# Patient Record
Sex: Female | Born: 1945 | Race: White | Hispanic: No | Smoking: Former smoker
Health system: Southern US, Community
[De-identification: ages and names within clinical notes are randomized; demographics above are authoritative.]

## PROBLEM LIST (undated history)

## (undated) DIAGNOSIS — M797 Fibromyalgia: Secondary | ICD-10-CM

## (undated) DIAGNOSIS — R002 Palpitations: Secondary | ICD-10-CM

## (undated) DIAGNOSIS — K219 Gastro-esophageal reflux disease without esophagitis: Secondary | ICD-10-CM

## (undated) DIAGNOSIS — M1990 Unspecified osteoarthritis, unspecified site: Secondary | ICD-10-CM

## (undated) DIAGNOSIS — K589 Irritable bowel syndrome without diarrhea: Secondary | ICD-10-CM

## (undated) DIAGNOSIS — I340 Nonrheumatic mitral (valve) insufficiency: Secondary | ICD-10-CM

## (undated) DIAGNOSIS — E538 Deficiency of other specified B group vitamins: Secondary | ICD-10-CM

## (undated) DIAGNOSIS — I10 Essential (primary) hypertension: Secondary | ICD-10-CM

## (undated) DIAGNOSIS — K635 Polyp of colon: Secondary | ICD-10-CM

## (undated) DIAGNOSIS — E785 Hyperlipidemia, unspecified: Secondary | ICD-10-CM

## (undated) DIAGNOSIS — I499 Cardiac arrhythmia, unspecified: Secondary | ICD-10-CM

## (undated) DIAGNOSIS — K5790 Diverticulosis of intestine, part unspecified, without perforation or abscess without bleeding: Secondary | ICD-10-CM

## (undated) DIAGNOSIS — K2210 Ulcer of esophagus without bleeding: Secondary | ICD-10-CM

## (undated) DIAGNOSIS — T7840XA Allergy, unspecified, initial encounter: Secondary | ICD-10-CM

## (undated) DIAGNOSIS — K760 Fatty (change of) liver, not elsewhere classified: Secondary | ICD-10-CM

## (undated) DIAGNOSIS — K449 Diaphragmatic hernia without obstruction or gangrene: Secondary | ICD-10-CM

## (undated) DIAGNOSIS — K222 Esophageal obstruction: Secondary | ICD-10-CM

## (undated) DIAGNOSIS — F419 Anxiety disorder, unspecified: Secondary | ICD-10-CM

## (undated) HISTORY — DX: Unspecified osteoarthritis, unspecified site: M19.90

## (undated) HISTORY — DX: Irritable bowel syndrome, unspecified: K58.9

## (undated) HISTORY — DX: Anxiety disorder, unspecified: F41.9

## (undated) HISTORY — DX: Polyp of colon: K63.5

## (undated) HISTORY — PX: RECTOCELE REPAIR: SHX761

## (undated) HISTORY — DX: Essential (primary) hypertension: I10

## (undated) HISTORY — PX: ABDOMINAL HYSTERECTOMY: SHX81

## (undated) HISTORY — DX: Gastro-esophageal reflux disease without esophagitis: K21.9

## (undated) HISTORY — PX: APPENDECTOMY: SHX54

## (undated) HISTORY — DX: Fatty (change of) liver, not elsewhere classified: K76.0

## (undated) HISTORY — DX: Hyperlipidemia, unspecified: E78.5

## (undated) HISTORY — PX: TUBAL LIGATION: SHX77

## (undated) HISTORY — DX: Ulcer of esophagus without bleeding: K22.10

## (undated) HISTORY — DX: Cardiac arrhythmia, unspecified: I49.9

## (undated) HISTORY — DX: Fibromyalgia: M79.7

## (undated) HISTORY — DX: Palpitations: R00.2

## (undated) HISTORY — DX: Nonrheumatic mitral (valve) insufficiency: I34.0

## (undated) HISTORY — DX: Diverticulosis of intestine, part unspecified, without perforation or abscess without bleeding: K57.90

## (undated) HISTORY — DX: Deficiency of other specified B group vitamins: E53.8

## (undated) HISTORY — DX: Diaphragmatic hernia without obstruction or gangrene: K44.9

## (undated) HISTORY — DX: Esophageal obstruction: K22.2

## (undated) HISTORY — PX: CHOLECYSTECTOMY: SHX55

## (undated) HISTORY — DX: Allergy, unspecified, initial encounter: T78.40XA

---

## 1999-06-17 ENCOUNTER — Encounter (INDEPENDENT_AMBULATORY_CARE_PROVIDER_SITE_OTHER): Payer: Self-pay | Admitting: Specialist

## 1999-06-17 ENCOUNTER — Other Ambulatory Visit: Admission: RE | Admit: 1999-06-17 | Discharge: 1999-06-17 | Payer: Self-pay | Admitting: Gastroenterology

## 1999-06-17 ENCOUNTER — Encounter (INDEPENDENT_AMBULATORY_CARE_PROVIDER_SITE_OTHER): Payer: Self-pay | Admitting: *Deleted

## 1999-07-29 ENCOUNTER — Encounter: Payer: Self-pay | Admitting: Internal Medicine

## 1999-07-29 ENCOUNTER — Ambulatory Visit (HOSPITAL_COMMUNITY): Admission: RE | Admit: 1999-07-29 | Discharge: 1999-07-29 | Payer: Self-pay | Admitting: Internal Medicine

## 1999-08-15 ENCOUNTER — Ambulatory Visit (HOSPITAL_COMMUNITY): Admission: RE | Admit: 1999-08-15 | Discharge: 1999-08-15 | Payer: Self-pay | Admitting: Internal Medicine

## 1999-08-15 ENCOUNTER — Encounter: Payer: Self-pay | Admitting: Internal Medicine

## 2000-03-26 ENCOUNTER — Encounter: Admission: RE | Admit: 2000-03-26 | Discharge: 2000-03-26 | Payer: Self-pay | Admitting: Obstetrics and Gynecology

## 2000-03-26 ENCOUNTER — Encounter: Payer: Self-pay | Admitting: Obstetrics and Gynecology

## 2000-12-17 ENCOUNTER — Other Ambulatory Visit: Admission: RE | Admit: 2000-12-17 | Discharge: 2000-12-17 | Payer: Self-pay | Admitting: Gastroenterology

## 2000-12-17 ENCOUNTER — Encounter (INDEPENDENT_AMBULATORY_CARE_PROVIDER_SITE_OTHER): Payer: Self-pay | Admitting: *Deleted

## 2000-12-17 ENCOUNTER — Encounter (INDEPENDENT_AMBULATORY_CARE_PROVIDER_SITE_OTHER): Payer: Self-pay | Admitting: Specialist

## 2001-01-17 ENCOUNTER — Encounter (INDEPENDENT_AMBULATORY_CARE_PROVIDER_SITE_OTHER): Payer: Self-pay | Admitting: *Deleted

## 2001-01-17 ENCOUNTER — Encounter: Payer: Self-pay | Admitting: Gastroenterology

## 2001-01-17 ENCOUNTER — Encounter: Admission: RE | Admit: 2001-01-17 | Discharge: 2001-01-17 | Payer: Self-pay | Admitting: Gastroenterology

## 2001-01-19 ENCOUNTER — Ambulatory Visit (HOSPITAL_COMMUNITY): Admission: RE | Admit: 2001-01-19 | Discharge: 2001-01-19 | Payer: Self-pay | Admitting: Gastroenterology

## 2001-01-19 ENCOUNTER — Encounter: Payer: Self-pay | Admitting: Gastroenterology

## 2001-01-19 ENCOUNTER — Encounter (INDEPENDENT_AMBULATORY_CARE_PROVIDER_SITE_OTHER): Payer: Self-pay | Admitting: *Deleted

## 2001-03-30 ENCOUNTER — Encounter: Payer: Self-pay | Admitting: Obstetrics and Gynecology

## 2001-03-30 ENCOUNTER — Encounter: Admission: RE | Admit: 2001-03-30 | Discharge: 2001-03-30 | Payer: Self-pay | Admitting: Obstetrics and Gynecology

## 2001-04-05 ENCOUNTER — Encounter: Admission: RE | Admit: 2001-04-05 | Discharge: 2001-04-05 | Payer: Self-pay | Admitting: Obstetrics and Gynecology

## 2001-04-05 ENCOUNTER — Encounter: Payer: Self-pay | Admitting: Obstetrics and Gynecology

## 2001-04-06 ENCOUNTER — Encounter: Payer: Self-pay | Admitting: Obstetrics and Gynecology

## 2001-07-28 ENCOUNTER — Encounter: Payer: Self-pay | Admitting: Obstetrics and Gynecology

## 2001-07-28 ENCOUNTER — Encounter: Admission: RE | Admit: 2001-07-28 | Discharge: 2001-07-28 | Payer: Self-pay | Admitting: Obstetrics and Gynecology

## 2002-03-31 ENCOUNTER — Encounter: Payer: Self-pay | Admitting: Gynecology

## 2002-03-31 ENCOUNTER — Encounter: Admission: RE | Admit: 2002-03-31 | Discharge: 2002-03-31 | Payer: Self-pay | Admitting: Gynecology

## 2002-07-11 ENCOUNTER — Other Ambulatory Visit: Admission: RE | Admit: 2002-07-11 | Discharge: 2002-07-11 | Payer: Self-pay | Admitting: Gynecology

## 2003-02-15 ENCOUNTER — Encounter: Admission: RE | Admit: 2003-02-15 | Discharge: 2003-02-15 | Payer: Self-pay | Admitting: Internal Medicine

## 2003-02-15 ENCOUNTER — Encounter: Payer: Self-pay | Admitting: Internal Medicine

## 2003-04-24 ENCOUNTER — Encounter: Admission: RE | Admit: 2003-04-24 | Discharge: 2003-04-24 | Payer: Self-pay | Admitting: Gynecology

## 2003-04-24 ENCOUNTER — Encounter: Payer: Self-pay | Admitting: Gynecology

## 2003-06-15 ENCOUNTER — Ambulatory Visit (HOSPITAL_COMMUNITY): Admission: RE | Admit: 2003-06-15 | Discharge: 2003-06-15 | Payer: Self-pay | Admitting: Gastroenterology

## 2003-06-15 ENCOUNTER — Encounter (INDEPENDENT_AMBULATORY_CARE_PROVIDER_SITE_OTHER): Payer: Self-pay | Admitting: *Deleted

## 2003-07-26 ENCOUNTER — Other Ambulatory Visit: Admission: RE | Admit: 2003-07-26 | Discharge: 2003-07-26 | Payer: Self-pay | Admitting: Gynecology

## 2004-04-24 ENCOUNTER — Encounter: Payer: Self-pay | Admitting: Internal Medicine

## 2004-04-24 ENCOUNTER — Encounter (INDEPENDENT_AMBULATORY_CARE_PROVIDER_SITE_OTHER): Payer: Self-pay | Admitting: Gastroenterology

## 2004-04-24 ENCOUNTER — Encounter: Payer: Self-pay | Admitting: Gastroenterology

## 2004-05-02 ENCOUNTER — Encounter: Admission: RE | Admit: 2004-05-02 | Discharge: 2004-05-02 | Payer: Self-pay | Admitting: Gynecology

## 2004-07-30 ENCOUNTER — Other Ambulatory Visit: Admission: RE | Admit: 2004-07-30 | Discharge: 2004-07-30 | Payer: Self-pay | Admitting: Gynecology

## 2004-09-03 ENCOUNTER — Ambulatory Visit: Payer: Self-pay | Admitting: Internal Medicine

## 2005-03-30 ENCOUNTER — Ambulatory Visit: Payer: Self-pay | Admitting: Gastroenterology

## 2005-05-19 ENCOUNTER — Encounter: Admission: RE | Admit: 2005-05-19 | Discharge: 2005-05-19 | Payer: Self-pay | Admitting: Gynecology

## 2005-08-04 ENCOUNTER — Other Ambulatory Visit: Admission: RE | Admit: 2005-08-04 | Discharge: 2005-08-04 | Payer: Self-pay | Admitting: Gynecology

## 2006-02-02 ENCOUNTER — Ambulatory Visit: Payer: Self-pay | Admitting: Endocrinology

## 2006-03-31 ENCOUNTER — Ambulatory Visit: Payer: Self-pay | Admitting: Gastroenterology

## 2006-05-12 ENCOUNTER — Ambulatory Visit: Payer: Self-pay | Admitting: Internal Medicine

## 2006-05-25 ENCOUNTER — Encounter: Admission: RE | Admit: 2006-05-25 | Discharge: 2006-05-25 | Payer: Self-pay | Admitting: Gynecology

## 2006-08-05 ENCOUNTER — Other Ambulatory Visit: Admission: RE | Admit: 2006-08-05 | Discharge: 2006-08-05 | Payer: Self-pay | Admitting: Gynecology

## 2006-08-05 ENCOUNTER — Ambulatory Visit: Payer: Self-pay | Admitting: Internal Medicine

## 2006-08-05 LAB — CONVERTED CEMR LAB
Bilirubin Urine: NEGATIVE
Chol/HDL Ratio, serum: 3.2
Cholesterol: 274 mg/dL (ref 0–200)
Crystals: NEGATIVE
HDL: 85.7 mg/dL (ref >39.0)
Mucus, UA: NEGATIVE
RBC / HPF: NONE SEEN
Specific Gravity, Urine: 1.01 (ref 1.000–1.03)
Total Protein, Urine: NEGATIVE mg/dL
pH: 8 (ref 5.0–8.0)

## 2006-08-13 ENCOUNTER — Ambulatory Visit: Payer: Self-pay | Admitting: Internal Medicine

## 2006-09-13 ENCOUNTER — Encounter (INDEPENDENT_AMBULATORY_CARE_PROVIDER_SITE_OTHER): Payer: Self-pay | Admitting: *Deleted

## 2006-09-13 ENCOUNTER — Ambulatory Visit: Payer: Self-pay | Admitting: Gastroenterology

## 2007-04-29 ENCOUNTER — Ambulatory Visit: Payer: Self-pay | Admitting: Internal Medicine

## 2007-05-26 ENCOUNTER — Encounter: Payer: Self-pay | Admitting: *Deleted

## 2007-05-26 DIAGNOSIS — M797 Fibromyalgia: Secondary | ICD-10-CM | POA: Insufficient documentation

## 2007-05-26 DIAGNOSIS — E785 Hyperlipidemia, unspecified: Secondary | ICD-10-CM

## 2007-05-26 DIAGNOSIS — K589 Irritable bowel syndrome without diarrhea: Secondary | ICD-10-CM

## 2007-05-26 DIAGNOSIS — E538 Deficiency of other specified B group vitamins: Secondary | ICD-10-CM

## 2007-05-26 DIAGNOSIS — I059 Rheumatic mitral valve disease, unspecified: Secondary | ICD-10-CM | POA: Insufficient documentation

## 2007-05-26 DIAGNOSIS — N951 Menopausal and female climacteric states: Secondary | ICD-10-CM

## 2007-05-30 ENCOUNTER — Encounter: Admission: RE | Admit: 2007-05-30 | Discharge: 2007-05-30 | Payer: Self-pay | Admitting: Gynecology

## 2007-06-02 ENCOUNTER — Encounter: Admission: RE | Admit: 2007-06-02 | Discharge: 2007-06-02 | Payer: Self-pay | Admitting: Gynecology

## 2007-07-12 ENCOUNTER — Ambulatory Visit: Payer: Self-pay | Admitting: Internal Medicine

## 2007-07-12 DIAGNOSIS — M545 Low back pain: Secondary | ICD-10-CM

## 2007-07-12 DIAGNOSIS — Z860100 Personal history of colon polyps, unspecified: Secondary | ICD-10-CM | POA: Insufficient documentation

## 2007-07-12 DIAGNOSIS — R61 Generalized hyperhidrosis: Secondary | ICD-10-CM | POA: Insufficient documentation

## 2007-07-12 DIAGNOSIS — J069 Acute upper respiratory infection, unspecified: Secondary | ICD-10-CM

## 2007-07-12 DIAGNOSIS — R000 Tachycardia, unspecified: Secondary | ICD-10-CM

## 2007-08-08 ENCOUNTER — Other Ambulatory Visit: Admission: RE | Admit: 2007-08-08 | Discharge: 2007-08-08 | Payer: Self-pay | Admitting: Gynecology

## 2007-08-08 ENCOUNTER — Encounter: Payer: Self-pay | Admitting: Internal Medicine

## 2007-11-04 DIAGNOSIS — K219 Gastro-esophageal reflux disease without esophagitis: Secondary | ICD-10-CM | POA: Insufficient documentation

## 2007-11-04 DIAGNOSIS — D126 Benign neoplasm of colon, unspecified: Secondary | ICD-10-CM

## 2007-11-04 DIAGNOSIS — K208 Other esophagitis: Secondary | ICD-10-CM

## 2007-11-04 DIAGNOSIS — K319 Disease of stomach and duodenum, unspecified: Secondary | ICD-10-CM

## 2007-11-04 DIAGNOSIS — E669 Obesity, unspecified: Secondary | ICD-10-CM

## 2007-11-14 ENCOUNTER — Ambulatory Visit: Payer: Self-pay | Admitting: Internal Medicine

## 2007-11-14 DIAGNOSIS — R030 Elevated blood-pressure reading, without diagnosis of hypertension: Secondary | ICD-10-CM

## 2007-11-14 DIAGNOSIS — I10 Essential (primary) hypertension: Secondary | ICD-10-CM | POA: Insufficient documentation

## 2008-01-24 ENCOUNTER — Ambulatory Visit: Payer: Self-pay | Admitting: Internal Medicine

## 2008-01-24 DIAGNOSIS — M79609 Pain in unspecified limb: Secondary | ICD-10-CM

## 2008-01-24 DIAGNOSIS — J029 Acute pharyngitis, unspecified: Secondary | ICD-10-CM | POA: Insufficient documentation

## 2008-01-24 DIAGNOSIS — S139XXA Sprain of joints and ligaments of unspecified parts of neck, initial encounter: Secondary | ICD-10-CM | POA: Insufficient documentation

## 2008-04-24 ENCOUNTER — Encounter: Payer: Self-pay | Admitting: Internal Medicine

## 2008-04-26 ENCOUNTER — Encounter: Payer: Self-pay | Admitting: Internal Medicine

## 2008-06-01 ENCOUNTER — Ambulatory Visit: Payer: Self-pay | Admitting: Internal Medicine

## 2008-06-01 DIAGNOSIS — F4321 Adjustment disorder with depressed mood: Secondary | ICD-10-CM | POA: Insufficient documentation

## 2008-06-04 ENCOUNTER — Encounter: Admission: RE | Admit: 2008-06-04 | Discharge: 2008-06-04 | Payer: Self-pay | Admitting: Gynecology

## 2008-09-04 ENCOUNTER — Ambulatory Visit: Payer: Self-pay | Admitting: Internal Medicine

## 2008-09-04 DIAGNOSIS — R109 Unspecified abdominal pain: Secondary | ICD-10-CM | POA: Insufficient documentation

## 2008-09-04 DIAGNOSIS — R5381 Other malaise: Secondary | ICD-10-CM | POA: Insufficient documentation

## 2008-09-04 DIAGNOSIS — R5383 Other fatigue: Secondary | ICD-10-CM

## 2008-10-02 ENCOUNTER — Telehealth (INDEPENDENT_AMBULATORY_CARE_PROVIDER_SITE_OTHER): Payer: Self-pay | Admitting: *Deleted

## 2008-10-04 ENCOUNTER — Ambulatory Visit: Payer: Self-pay | Admitting: Internal Medicine

## 2008-11-01 ENCOUNTER — Ambulatory Visit: Payer: Self-pay | Admitting: Internal Medicine

## 2008-11-02 ENCOUNTER — Telehealth: Payer: Self-pay | Admitting: Internal Medicine

## 2008-11-02 ENCOUNTER — Ambulatory Visit (HOSPITAL_COMMUNITY): Admission: RE | Admit: 2008-11-02 | Discharge: 2008-11-02 | Payer: Self-pay | Admitting: Internal Medicine

## 2009-04-26 ENCOUNTER — Ambulatory Visit: Payer: Self-pay | Admitting: Internal Medicine

## 2009-04-26 DIAGNOSIS — R042 Hemoptysis: Secondary | ICD-10-CM | POA: Insufficient documentation

## 2009-04-26 DIAGNOSIS — R05 Cough: Secondary | ICD-10-CM

## 2009-04-29 DIAGNOSIS — F411 Generalized anxiety disorder: Secondary | ICD-10-CM | POA: Insufficient documentation

## 2009-06-11 ENCOUNTER — Encounter: Admission: RE | Admit: 2009-06-11 | Discharge: 2009-06-11 | Payer: Self-pay | Admitting: Gynecology

## 2009-06-19 ENCOUNTER — Encounter: Payer: Self-pay | Admitting: Internal Medicine

## 2009-10-18 ENCOUNTER — Encounter: Payer: Self-pay | Admitting: Internal Medicine

## 2009-11-21 ENCOUNTER — Telehealth: Payer: Self-pay | Admitting: Internal Medicine

## 2009-11-21 ENCOUNTER — Ambulatory Visit: Payer: Self-pay | Admitting: Internal Medicine

## 2009-11-21 DIAGNOSIS — J309 Allergic rhinitis, unspecified: Secondary | ICD-10-CM

## 2009-12-09 ENCOUNTER — Ambulatory Visit (HOSPITAL_COMMUNITY): Admission: RE | Admit: 2009-12-09 | Discharge: 2009-12-09 | Payer: Self-pay | Admitting: Internal Medicine

## 2009-12-09 ENCOUNTER — Encounter: Payer: Self-pay | Admitting: Internal Medicine

## 2009-12-09 ENCOUNTER — Ambulatory Visit: Payer: Self-pay

## 2009-12-09 ENCOUNTER — Ambulatory Visit: Payer: Self-pay | Admitting: Cardiology

## 2009-12-12 ENCOUNTER — Telehealth: Payer: Self-pay | Admitting: Internal Medicine

## 2009-12-16 ENCOUNTER — Ambulatory Visit: Payer: Self-pay | Admitting: Internal Medicine

## 2009-12-16 DIAGNOSIS — R002 Palpitations: Secondary | ICD-10-CM | POA: Insufficient documentation

## 2009-12-16 DIAGNOSIS — I519 Heart disease, unspecified: Secondary | ICD-10-CM

## 2010-06-09 ENCOUNTER — Encounter: Payer: Self-pay | Admitting: Internal Medicine

## 2010-06-12 ENCOUNTER — Ambulatory Visit: Payer: Self-pay | Admitting: Internal Medicine

## 2010-06-16 ENCOUNTER — Encounter: Admission: RE | Admit: 2010-06-16 | Discharge: 2010-06-16 | Payer: Self-pay | Admitting: Gynecology

## 2010-08-04 ENCOUNTER — Telehealth (INDEPENDENT_AMBULATORY_CARE_PROVIDER_SITE_OTHER): Payer: Self-pay | Admitting: *Deleted

## 2010-08-07 ENCOUNTER — Ambulatory Visit: Payer: Self-pay | Admitting: Internal Medicine

## 2010-08-07 ENCOUNTER — Encounter: Payer: Self-pay | Admitting: Internal Medicine

## 2010-08-08 LAB — CONVERTED CEMR LAB
Albumin: 4.5 g/dL (ref 3.5–5.2)
Alkaline Phosphatase: 45 units/L (ref 39–117)
Basophils Relative: 0.3 % (ref 0.0–3.0)
Bilirubin Urine: NEGATIVE
Bilirubin, Direct: 0.2 mg/dL (ref 0.0–0.3)
Blood, UA: NEGATIVE
Creatinine, Ser: 0.8 mg/dL (ref 0.4–1.2)
Eosinophils Absolute: 0 10*3/uL (ref 0.0–0.7)
Eosinophils Relative: 0.6 % (ref 0.0–5.0)
GFR calc non Af Amer: 71.98 mL/min (ref >60.00)
Glucose, Bld: 96 mg/dL (ref 70–99)
HCT: 44.9 % (ref 36.0–46.0)
Lymphocytes Relative: 25.4 % (ref 12.0–46.0)
MCHC: 33.9 g/dL (ref 30.0–36.0)
Monocytes Relative: 6 % (ref 3.0–12.0)
Neutro Abs: 5.9 10*3/uL (ref 1.4–7.7)
Platelets: 237 10*3/uL (ref 150.0–400.0)
Potassium: 5.1 meq/L (ref 3.5–5.1)
RBC: 4.93 M/uL (ref 3.87–5.11)
Sodium: 141 meq/L (ref 135–145)
Total Protein: 7.9 g/dL (ref 6.0–8.3)
WBC: 8.7 10*3/uL (ref 4.5–10.5)
pH: 6.5 (ref 5.0–8.0)

## 2010-08-12 ENCOUNTER — Telehealth: Payer: Self-pay | Admitting: Internal Medicine

## 2010-08-12 ENCOUNTER — Encounter: Payer: Self-pay | Admitting: Internal Medicine

## 2010-08-12 ENCOUNTER — Encounter (INDEPENDENT_AMBULATORY_CARE_PROVIDER_SITE_OTHER): Payer: Self-pay | Admitting: *Deleted

## 2010-09-07 ENCOUNTER — Encounter: Payer: Self-pay | Admitting: Gynecology

## 2010-09-12 ENCOUNTER — Encounter: Payer: Self-pay | Admitting: Family Medicine

## 2010-09-12 ENCOUNTER — Ambulatory Visit
Admission: RE | Admit: 2010-09-12 | Discharge: 2010-09-12 | Payer: Self-pay | Source: Home / Self Care | Attending: Family Medicine | Admitting: Family Medicine

## 2010-09-12 LAB — CONVERTED CEMR LAB
HDL goal, serum: 40 mg/dL
LDL Goal: 160 mg/dL

## 2010-09-15 ENCOUNTER — Telehealth: Payer: Self-pay | Admitting: Internal Medicine

## 2010-09-16 NOTE — Assessment & Plan Note (Signed)
Summary: f/u new med / SD   Vital Signs:  Patient profile:   65 year old female Height:      64 inches Weight:      185 pounds BMI:     31.87 O2 Sat:      96 % on Room air Temp:     97.1 degrees F oral Pulse rate:   74 / minute Pulse rhythm:   regular BP sitting:   140 / 84  (left arm) Cuff size:   large  Vitals Entered By: Rock Nephew CMA (Dec 16, 2009 1:58 PM)  O2 Flow:  Room air CC: follow-up visit// discuss BP medication Is Patient Diabetic? No Pain Assessment Patient in pain? no        Primary Care Provider:  Sonda Primes MD  CC:  follow-up visit// discuss BP medication.  History of Present Illness: F/u abn ECHO, anxiety, depressed mood. C/o depression. C/o occasional palpitations.  Current Medications (verified): 1)  Vitamin B-12 1000 Mcg  Tabs (Cyanocobalamin) .Marland Kitchen.. 1 Qd 2)  Vitamin D3 2000 Unit Caps (Cholecalciferol) .... Take 1 Tablet By Mouth Once A Day 3)  Aspirin 81 Mg  Tbec (Aspirin) .... One By Mouth Every Day 4)  Diazepam 5 Mg Tabs (Diazepam) .... 1/2 or 1 By Mouth Two Times A Day As Needed Anxiety 5)  Fish Oil  Oil (Fish Oil) .... Once Daily 6)  Nexium 40 Mg Cpdr (Esomeprazole Magnesium) .Marland Kitchen.. 1 By Mouth Qam ( Medically Necessary) 7)  Amlodipine Besylate 5 Mg Tabs (Amlodipine Besylate) .... 1/2 Tab By Mouth Once Daily 8)  Omeprazole  Allergies (verified): 1)  ! Codeine 2)  ! Sulfa 3)  ! Augmentin 4)  ! Naprosyn 5)  Propranolol Hcl (Propranolol Hcl)  Past History:  Past Surgical History: Last updated: 05/26/2007 Cholecystectomy  Social History: Last updated: 12/16/2009 Former Smoker Single lives with a female companion Alcoholic son - very stressed Alcohol Use - yes 2 glasses red wine occ Daily Caffeine Use 2-3 cups coffee Illicit Drug Use - no Retired 2010  Past Medical History: Hyperlipidemia FMS MVP IBS Borderline low Vit B12 Colonic polyps, hx of gyn Dr Nicholas Lose Anxiety/stress with alcoholic son Allergic rhinitis Mild  diastolic dysfanction on ECHO  6433  Social History: Former Smoker Single lives with a female companion Alcoholic son - very stressed Alcohol Use - yes 2 glasses red wine occ Daily Caffeine Use 2-3 cups coffee Illicit Drug Use - no Retired 2010  Review of Systems       The patient complains of chest pain and dyspnea on exertion.  The patient denies fever and abdominal pain.         Stressed out  Physical Exam  General:  Well developed, well nourished, no acute distress. Eyes:  No corneal or conjunctival inflammation noted. EOMI. Perrla.  Nose:  External nasal examination shows no deformity or inflammation. Nasal mucosa are pink and moist without lesions or exudates. Mouth:  No deformity or lesions, dentition normal. Lungs:  Clear throughout to auscultation. Heart:  Regular rate and rhythm; no murmurs, rubs,  or bruits. Abdomen:  obese, soft without tenderness mass or hernia good bowel sounds heard there are surgical incision well healed Msk:  WNL Extremities:  No palmar erythema, no edema.  Neurologic:  Alert and  oriented x4;  grossly normal neurologically. Skin:  Intact without significant lesions or rashes. Psych:  Oriented X3 and normally interactive. Sad.     Impression & Recommendations:  Problem # 1:  PALPITATIONS (ICD-785.1) Assessment Unchanged Stress discussed  Problem # 2:  DIASTOLIC DYSFUNCTION (ICD-429.9) mild Assessment: New Discussed. We will try a low dose Norvasc if tolerated  Problem # 3:  ELEVATED BP (ICD-796.2) Assessment: Comment Only BP nl at home Her updated medication list for this problem includes:    Amlodipine Besylate 5 Mg Tabs (Amlodipine besylate) .Marland Kitchen... 1/2 tab by mouth once daily  Problem # 4:  ANXIETY (ICD-300.00) Assessment: Unchanged  Her updated medication list for this problem includes:    Diazepam 5 Mg Tabs (Diazepam) .Marland Kitchen... 1/2 or 1 by mouth two times a day as needed anxiety  Complete Medication List: 1)  Vitamin B-12 1000  Mcg Tabs (Cyanocobalamin) .Marland Kitchen.. 1 qd 2)  Vitamin D3 2000 Unit Caps (Cholecalciferol) .... Take 1 tablet by mouth once a day 3)  Aspirin 81 Mg Tbec (Aspirin) .... One by mouth every day 4)  Diazepam 5 Mg Tabs (Diazepam) .... 1/2 or 1 by mouth two times a day as needed anxiety 5)  Fish Oil Oil (Fish oil) .... Once daily 6)  Nexium 40 Mg Cpdr (Esomeprazole magnesium) .Marland Kitchen.. 1 by mouth qam ( medically necessary) 7)  Amlodipine Besylate 5 Mg Tabs (Amlodipine besylate) .... 1/2 tab by mouth once daily 8)  Omeprazole   Patient Instructions: 1)  Please schedule a follow-up appointment in 2 months. 2)  Call if you are not better in a reasonable amount of time or if worse. 3)  Take Amlodipine 1/4 tab a day to start with

## 2010-09-16 NOTE — Progress Notes (Signed)
Summary: RESULTS ECHO  Phone Note Call from Patient Call back at [phone]   Summary of Call: Patient is requesting results of Echo. She was told by cardiology that Dr Posey Rea would have results the same day test was completed. Please advise, Patient is very anxious.  Initial call taken by: Lamar Sprinkles, CMA,  December 12, 2009 1:58 PM  Follow-up for Phone Call        ECHO looks pretty good  but it does show a very minor issue with the relaxation phase of the heart cycle, this is usually caused  by hypertension and can be discussed with Dr. Demetrius Charity. during the next visit Follow-up by: Etta Grandchild MD,  December 12, 2009 2:01 PM  Additional Follow-up for Phone Call Additional follow up Details #1::        Pt informed. She says that at the office visit for ECHO she was under a tremendous amount of stress and had been for a week for more. She describes her visit for the echo as stressful and bp was elevated. When asking if the elevated bp would affect the test, pt was not given a direct answer. She requested to reschedule if it would affect the results, again was not given a straight answer, per pt.  Could elevated bp during echo cause the abnormality?  Additional Follow-up by: Lamar Sprinkles, CMA,  December 12, 2009 6:16 PM    Additional Follow-up for Phone Call Additional follow up Details #2::    No. Not to worry. She would benefit from a low dose of BP medication that helps heart to relax in dyastole. Try Amlodipine. ROV in 1 mo No mitral valve prolapse was noted on ECHO, good news. Follow-up by: Tresa Garter MD,  December 13, 2009 7:49 AM  Additional Follow-up for Phone Call Additional follow up Details #3:: Details for Additional Follow-up Action Taken: Pt informed, she does not want to wait 1 full mth but will take med. Scheduled for 2 wk f/u. Would like to discuss the "palpatations" further. Has concerns about arrhythmias and would like to discuss this at next office visit. .....Marland KitchenMarland KitchenLamar Sprinkles, CMA  December 13, 2009 2:59 PM   New/Updated Medications: AMLODIPINE BESYLATE 5 MG TABS (AMLODIPINE BESYLATE) 1/2 tab by mouth once daily Prescriptions: AMLODIPINE BESYLATE 5 MG TABS (AMLODIPINE BESYLATE) 1/2 tab by mouth once daily  #30 x 12   Entered and Authorized by:   Tresa Garter MD   Signed by:   Lamar Sprinkles, CMA on 12/13/2009   Method used:   Electronically to        [id number] (retail)       [address]       [place], [place]  [address]       Ph: [phone] or [phone]       Fax: [phone]   RxID:   [id number]

## 2010-09-16 NOTE — Miscellaneous (Signed)
Summary: Flu 2011   Clinical Lists Changes  Observations: Added new observation of FLU VAX: Historical (06/05/2010 8:24)      Immunization History:  Influenza Immunization History:    Influenza:  historical (06/05/2010)

## 2010-09-16 NOTE — Assessment & Plan Note (Signed)
Summary: chest congestion--stc   Vital Signs:  Patient profile:   65 year old female Height:      64 inches (162.56 cm) Weight:      189.8 pounds (86.27 kg) BMI:     32.70 O2 Sat:      99 % Temp:     98.4 degrees F (36.89 degrees C) oral Pulse rate:   100 / minute BP sitting:   162 / 90  (left arm) Cuff size:   regular  Vitals Entered By: Orlan Leavens (November 21, 2009 9:09 AM) CC: chest congestion Is Patient Diabetic? No Pain Assessment Patient in pain? no        Primary Care Provider:  Sonda Primes MD  CC:  chest congestion.  History of Present Illness: C/o chest congestion, allergies. C/o stress - worse lately. B in law just died of pneumonia C/o stress States BP is good all the time  Current Medications (verified): 1)  Omeprazole 20 Mg  Cpdr (Omeprazole) .... Take 1-2 By Mouth Qd 2)  Vitamin B-12 1000 Mcg  Tabs (Cyanocobalamin) .Marland Kitchen.. 1 Qd 3)  Vitamin D3 1000 Unit  Tabs (Cholecalciferol) .Marland Kitchen.. 1 Qd 4)  Aspirin 81 Mg  Tbec (Aspirin) .... One By Mouth Every Day 5)  Diazepam 5 Mg Tabs (Diazepam) .... 1/2 or 1 By Mouth Two Times A Day As Needed Anxiety 6)  Fish Oil  Oil (Fish Oil) .... Once Daily 7)  Fluticasone Propionate 50 Mcg/act  Susp (Fluticasone Propionate) .... 2 Sprays Each Nostril Once Daily  Allergies (verified): 1)  ! Codeine 2)  ! Sulfa 3)  ! Augmentin 4)  ! Naprosyn 5)  Propranolol Hcl (Propranolol Hcl)  Past History:  Past Surgical History: Last updated: 05/26/2007 Cholecystectomy  Family History: Last updated: 11/01/2008 Family History Hypertension Family History of Ovarian Cancer: Mother No FH of Colon Cancer: Family History of Diabetes: Sister  Social History: Last updated: 04/26/2009 Former Smoker Single lives with a female companion Alcohol Use - yes 2 glasses red wine occ Daily Caffeine Use 2-3 cups coffee Illicit Drug Use - no Retired 2010  Past Medical History: Hyperlipidemia FMS MVP IBS Borderline low Vit B12 Colonic  polyps, hx of gyn Dr Nicholas Lose Anxiety Allergic rhinitis  Review of Systems  The patient denies dyspnea on exertion, abdominal pain, suspicious skin lesions, and difficulty walking.    Physical Exam  General:  Well developed, well nourished, no acute distress. Nose:  External nasal examination shows no deformity or inflammation. Nasal mucosa are pink and moist without lesions or exudates. Mouth:  No deformity or lesions, dentition normal. Lungs:  Clear throughout to auscultation. Heart:  Regular rate and rhythm; no murmurs, rubs,  or bruits. Abdomen:  obese, soft without tenderness mass or hernia good bowel sounds heard there are surgical incision well healed Msk:  WNL Neurologic:  Alert and  oriented x4;  grossly normal neurologically. Skin:  Intact without significant lesions or rashes. Psych:  Oriented X3 and normally interactive. Sad.     Impression & Recommendations:  Problem # 1:  ALLERGIC RHINITIS (ICD-477.9) Assessment New  Her updated medication list for this problem includes:    Fluticasone Propionate 50 Mcg/act Susp (Fluticasone propionate) .Marland Kitchen... 2 sprays each nostril once daily    Loratadine 10 Mg Tabs (Loratadine) .Marland Kitchen... 1 by mouth once daily as needed allergies  Problem # 2:  GRIEF REACTION (ICD-309.0) Assessment: New Discussed  Problem # 3:  HYPERTENSION (ICD-401.9) Assessment: Deteriorated  States BP nl at home The following  medications were removed from the medication list:    Propranolol Hcl 10 Mg Tabs (Propranolol hcl) .Marland Kitchen... 1/2 by mouth in am and 1/2 in pm  BP today: 162/90 Prior BP: 150/96 (04/26/2009)  Labs Reviewed: Chol: 274 (08/05/2006)   HDL: 85.7 (08/05/2006)   LDL: DEL (08/05/2006)   TG: 87 (08/05/2006)  Problem # 4:  ANXIETY (ICD-300.00) Assessment: Unchanged  Her updated medication list for this problem includes:    Diazepam 5 Mg Tabs (Diazepam) .Marland Kitchen... 1/2 or 1 by mouth two times a day as needed anxiety  Complete Medication List: 1)   Vitamin B-12 1000 Mcg Tabs (Cyanocobalamin) .Marland Kitchen.. 1 qd 2)  Vitamin D3 1000 Unit Tabs (Cholecalciferol) .Marland Kitchen.. 1 qd 3)  Aspirin 81 Mg Tbec (Aspirin) .... One by mouth every day 4)  Diazepam 5 Mg Tabs (Diazepam) .... 1/2 or 1 by mouth two times a day as needed anxiety 5)  Fish Oil Oil (Fish oil) .... Once daily 6)  Fluticasone Propionate 50 Mcg/act Susp (Fluticasone propionate) .... 2 sprays each nostril once daily 7)  Loratadine 10 Mg Tabs (Loratadine) .Marland Kitchen.. 1 by mouth once daily as needed allergies 8)  Zithromax Z-pak 250 Mg Tabs (Azithromycin) .... As dirrected 9)  Nexium 40 Mg Cpdr (Esomeprazole magnesium) .Marland Kitchen.. 1 by mouth qam ( medically necessary)  Other Orders: Pneumococcal Vaccine (04540) Admin 1st Vaccine (98119) Admin 1st Vaccine [facility]) [phone]) Echo Referral (Echo)  Patient Instructions: 1)  Please schedule a follow-up appointment in 6 months well w/labs. 2)  Nl BP <135/85 Prescriptions: ZITHROMAX Z-PAK 250 MG TABS (AZITHROMYCIN) as dirrected  #1 x 0   Entered and Authorized by:   Tresa Garter MD   Signed by:   Tresa Garter MD on 11/21/2009   Method used:   Electronically to        [id number]* (retail)       [address]       [place], [place]  [address]       Ph: [phone] or [phone]       Fax: [phone]   RxID:   [id number] LORATADINE 10 MG TABS (LORATADINE) 1 by mouth once daily as needed allergies  #90 x 3   Entered and Authorized by:   Tresa Garter MD   Signed by:   Tresa Garter MD on 11/21/2009   Method used:   Electronically to        [facility]  [address] [id number]* (retail)       [address]       [place], [place]  [address]       Ph: [phone] or [phone]       Fax: [phone]   RxID:   [id number]    Pneumovax Vaccine    Vaccine Type: Pneumovax    Site: left deltoid    Mfr: Merck    Dose: 0.5 ml    Route: IM    Given by: Orlan Leavens     Exp. Date: 03/31/2011    Lot #: [id number]    VIS given: 11/21/09

## 2010-09-16 NOTE — Progress Notes (Signed)
Summary: nexium  Phone Note Call from Patient   Caller: Patient Reason for Call: Talk to Doctor Summary of Call: Pt is requesting a new rx for nexium.  Initial call taken by: Orlan Leavens,  November 21, 2009 9:58 AM  Follow-up for Phone Call        ok Follow-up by: Tresa Garter MD,  November 21, 2009 12:04 PM  Additional Follow-up for Phone Call Additional follow up Details #1::        called pt no ansew [facility] rx sent to pharm Additional Follow-up by: Orlan Leavens,  November 21, 2009 3:38 PM    New/Updated Medications: NEXIUM 40 MG CPDR (ESOMEPRAZOLE MAGNESIUM) 1 by mouth qam ( medically necessary) Prescriptions: NEXIUM 40 MG CPDR (ESOMEPRAZOLE MAGNESIUM) 1 by mouth qam ( medically necessary)  #30 x 12   Entered and Authorized by:   Tresa Garter MD   Signed by:   Orlan Leavens on 11/21/2009   Method used:   Electronically to        [facility]  [address] [id number] (retail)       [address]       [place], [place]  [address]       Ph: [phone] or [phone]       Fax: [phone]   RxID:   [id number]

## 2010-09-18 ENCOUNTER — Encounter (INDEPENDENT_AMBULATORY_CARE_PROVIDER_SITE_OTHER): Payer: Medicare Other

## 2010-09-18 DIAGNOSIS — R002 Palpitations: Secondary | ICD-10-CM

## 2010-09-18 NOTE — Assessment & Plan Note (Signed)
Vital Signs:  Patient profile:   65 year old female Height:      64 inches Weight:      183 pounds BMI:     31.53 Temp:     98.8 degrees F oral Pulse rate:   88 / minute Pulse rhythm:   regular Resp:     16 per minute BP sitting:   140 / 90  (left arm) Cuff size:   regular  Vitals Entered By: Lanier Prude, CMA(AAMA) (June 12, 2010 8:43 AM) CC: sore throat/ear pain X 3 days Is Patient Diabetic? No   Primary Care Provider:  Sonda Primes MD  CC:  sore throat/ear pain X 3 days.  History of Present Illness: The patient presents for a follow up of GERD, anxiety, depression and diast dysfunction.   Current Medications (verified): 1)  Vitamin B-12 1000 Mcg  Tabs (Cyanocobalamin) .Marland Kitchen.. 1 Qd 2)  Vitamin D3 2000 Unit Caps (Cholecalciferol) .... Take 1 Tablet By Mouth Once A Day 3)  Aspirin 81 Mg  Tbec (Aspirin) .... One By Mouth Every Day 4)  Diazepam 5 Mg Tabs (Diazepam) .... 1/2 or 1 By Mouth Two Times A Day As Needed Anxiety 5)  Fish Oil  Oil (Fish Oil) .... Once Daily 6)  Nexium 40 Mg Cpdr (Esomeprazole Magnesium) .Marland Kitchen.. 1 By Mouth Qam ( Medically Necessary) 7)  Amlodipine Besylate 5 Mg Tabs (Amlodipine Besylate) .... 1/2 Tab By Mouth Once Daily 8)  Omeprazole 9)  Omeprazole 40 Mg Cpdr (Omeprazole) .Marland Kitchen.. 1 By Mouth Bid 10)  Zithromax Z-Pak 250 Mg Tabs (Azithromycin) .... As Dirrected  Allergies (verified): 1)  ! Codeine 2)  ! Sulfa 3)  ! Augmentin 4)  ! 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 Allergic rhinitis Mild diastolic dysfanction on ECHO  1610  Social History: Last updated: 12/16/2009 Former Smoker Single lives with a female companion Alcoholic son - very stressed Alcohol Use - yes 2 glasses red wine occ Daily Caffeine Use 2-3 cups coffee Illicit Drug Use -  no Retired 2010  Review of Systems  The patient denies anorexia, chest pain, dyspnea on exertion, and abdominal pain.    Physical Exam  General:  Well developed, well nourished, no acute distress. Nose:  External nasal examination shows no deformity or inflammation. Nasal mucosa are pink and moist without lesions or exudates. Mouth:  No deformity or lesions, dentition normal. Lungs:  Clear throughout to auscultation. Heart:  Regular rate and rhythm; no murmurs, rubs,  or bruits. Abdomen:  obese, soft without tenderness mass or hernia good bowel sounds heard there are surgical incision well healed Msk:  WNL Extremities:  No palmar erythema, no edema.  Neurologic:  Alert and  oriented x4;  grossly normal neurologically. Skin:  Intact without significant lesions or rashes. Psych:  Oriented X3 and normally interactive. Sad.     Impression & Recommendations:  Problem # 1:  DIASTOLIC DYSFUNCTION (ICD-429.9) Assessment Unchanged Take Norvasc  Problem # 2:  ANXIETY (ICD-300.00) Assessment: Unchanged  Her updated medication list for this problem includes:    Diazepam 5 Mg Tabs (Diazepam) .Marland Kitchen... 1/2 or 1 by mouth two times a day as needed anxiety  Problem # 3:  HYPERTENSION (ICD-401.9) Assessment: Unchanged  Her updated medication list for this problem includes:    Amlodipine Besylate 5 Mg Tabs (Amlodipine  besylate) .Marland Kitchen... 1/2 tab by mouth once daily  Problem # 4:  GERD (ICD-530.81) Assessment: Comment Only  Her updated medication list for this problem includes:    Nexium 40 Mg Cpdr (Esomeprazole magnesium) .Marland Kitchen... 1 by mouth qam ( medically necessary)    Omeprazole 40 Mg Cpdr (Omeprazole) .Marland Kitchen... 1 by mouth bid  Problem # 5:  FATIGUE (ICD-780.79) Assessment: Unchanged  Complete Medication List: 1)  Vitamin B-12 1000 Mcg Tabs (Cyanocobalamin) .Marland Kitchen.. 1 qd 2)  Vitamin D3 2000 Unit Caps (Cholecalciferol) .... Take 1 tablet by mouth once a day 3)  Aspirin 81 Mg Tbec (Aspirin) .... One  by mouth every day 4)  Diazepam 5 Mg Tabs (Diazepam) .... 1/2 or 1 by mouth two times a day as needed anxiety 5)  Fish Oil Oil (Fish oil) .... Once daily 6)  Nexium 40 Mg Cpdr (Esomeprazole magnesium) .Marland Kitchen.. 1 by mouth qam ( medically necessary) 7)  Amlodipine Besylate 5 Mg Tabs (Amlodipine besylate) .... 1/2 tab by mouth once daily 8)  Omeprazole  9)  Omeprazole 40 Mg Cpdr (Omeprazole) .Marland Kitchen.. 1 by mouth bid 10)  Zithromax Z-pak 250 Mg Tabs (Azithromycin) .... As dirrected  Other Orders: Tdap => 9yrs IM ([address]) Admin 1st Vaccine ([address])  Patient Instructions: 1)  Please schedule a follow-up appointment in 3 months.   Orders Added: 1)  Tdap => 27yrs IM [90715] 2)  Admin 1st Vaccine [90471] 3)  Est. Patient Level IV [09811]   Immunizations Administered:  Tetanus Vaccine:    Vaccine Type: Tdap    Site: left deltoid    Mfr: GlaxoSmithKline    Dose: 0.5 ml    Route: IM    Given by: Lanier Prude, CMA(AAMA)    Exp. Date: 06/05/2012    Lot #: [id number]    VIS given: 07/04/08 version given June 12, 2010.   Immunizations Administered:  Tetanus Vaccine:    Vaccine Type: Tdap    Site: left deltoid    Mfr: GlaxoSmithKline    Dose: 0.5 ml    Route: IM    Given by: Lanier Prude, CMA(AAMA)    Exp. Date: 06/05/2012    Lot #: [id number]    VIS given: 07/04/08 version given June 12, 2010.  Appended Document:  Problem #6. URI. Zpac as dirrected

## 2010-09-18 NOTE — Assessment & Plan Note (Signed)
Summary: BP PROBLEM---STC   Vital Signs:  Patient profile:   65 year old female Height:      64 inches Weight:      185.50 pounds BMI:     31.96 O2 Sat:      97 % on Room air Temp:     98.1 degrees F oral Pulse rate:   96 / minute Pulse rhythm:   regular BP sitting:   168 / 100  (left arm) Cuff size:   large  Vitals Entered By: Rock Nephew CMA (August 07, 2010 11:10 AM)  O2 Flow:  Room air CC: Patient c/o high bp readings   Primary Care Provider:  Sonda Primes MD  CC:  Patient c/o high bp readings.  History of Present Illness: C/o elev BP and elev HR x weeks. C/o palpitations at times SBP 120-150s at home HR70-83 The patient presents with complaints of sore throat,, sinus congestion and drainge of several days duration. Not better with OTC meds.   Allergies (verified): 1)  ! Codeine 2)  ! Sulfa 3)  ! Augmentin 4)  ! 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 Allergic rhinitis Mild diastolic dysfanction on ECHO  1610  Social History: Last updated: 12/16/2009 Former Smoker Single lives with a female companion Alcoholic son - very stressed Alcohol Use - yes 2 glasses red wine occ Daily Caffeine Use 2-3 cups coffee Illicit Drug Use - no Retired 2010  Review of Systems  The patient denies fever, hoarseness, chest pain, and abdominal pain.         stressed  Physical Exam  General:  Well developed, well nourished, no acute distress. Eyes:  No corneal or conjunctival inflammation noted. EOMI. Perrla.  Mouth:  No deformity or lesions, dentition normal. Lungs:  Clear throughout to auscultation. Heart:  Regular rate and rhythm; no murmurs, rubs,  or bruits. Tachycardic Abdomen:  obese, soft without tenderness mass or hernia good bowel sounds heard there are  surgical incision well healed Msk:  WNL Extremities:  No palmar erythema, no edema.  Neurologic:  Alert and  oriented x4;  grossly normal neurologically. Skin:  Intact without significant lesions or rashes. Psych:  Oriented X3 and normally interactive. Not depressed appearing and slightly anxious.     Impression & Recommendations:  Problem # 1:  HYPERTENSION (ICD-401.9) Assessment Deteriorated  Her updated medication list for this problem includes:    Amlodipine Besylate 5 Mg Tabs (Amlodipine besylate) .Marland Kitchen... 1/2 tab by mouth bid  Problem # 2:  ANXIETY (ICD-300.00)/ stress Assessment: Deteriorated She declined SSRI Her updated medication list for this problem includes:    Diazepam 5 Mg Tabs (Diazepam) .Marland Kitchen... 1/2 or 1 by mouth two times a day as needed anxiety  Problem # 3:  TACHYCARDIA (ICD-785.0) Assessment: Unchanged  Orders: TLB-BMP (Basic Metabolic Panel-BMET) (80048-METABOL) TLB-B12, Serum-Total ONLY (96045-W09) TLB-CBC Platelet - w/Differential (85025-CBCD) TLB-Hepatic/Liver Function Pnl (80076-HEPATIC) TLB-TSH (Thyroid Stimulating Hormone) (84443-TSH) TLB-Udip ONLY (81003-UDIP)  Problem # 4:  VITAMIN B12 DEFICIENCY (ICD-266.2) Assessment: Comment Only  On the regimen of medicine(s) reflected in the chart    Orders: TLB-BMP (Basic Metabolic Panel-BMET) (80048-METABOL) TLB-B12, Serum-Total ONLY (81191-Y78) TLB-CBC Platelet - w/Differential (85025-CBCD) TLB-Hepatic/Liver Function Pnl (80076-HEPATIC) TLB-TSH (Thyroid Stimulating Hormone) (84443-TSH) TLB-Udip ONLY (81003-UDIP)  Problem # 5:  UPPER RESPIRATORY INFECTION, ACUTE (ICD-465.9) Assessment: New Zpac if worse  Her updated medication list for this problem includes:    Aspirin 81 Mg Tbec (Aspirin) ..... One by mouth every day    Loratadine 10 Mg Tabs (Loratadine) .Marland Kitchen... 1 by mouth once daily as needed allergies  Complete Medication List: 1)  Vitamin B-12 1000 Mcg Tabs (Cyanocobalamin) .Marland Kitchen.. 1 qd 2)  Vitamin  D3 2000 Unit Caps (Cholecalciferol) .... Take 1 tablet by mouth once a day 3)  Aspirin 81 Mg Tbec (Aspirin) .... One by mouth every day 4)  Diazepam 5 Mg Tabs (Diazepam) .... 1/2 or 1 by mouth two times a day as needed anxiety 5)  Fish Oil Oil (Fish oil) .... Once daily 6)  Amlodipine Besylate 5 Mg Tabs (Amlodipine besylate) .... 1/2 tab by mouth bid 7)  Omeprazole 40 Mg Cpdr (Omeprazole) .Marland Kitchen.. 1 by mouth bid 8)  Loratadine 10 Mg Tabs (Loratadine) .Marland Kitchen.. 1 by mouth once daily as needed allergies 9)  Zithromax Z-pak 250 Mg Tabs (Azithromycin) .... As dirrected  Other Orders: EKG w/ Interpretation (93000)  Patient Instructions: 1)  Please schedule a follow-up appointment in 3 months. 2)  Call if you are not better in a reasonable amount of time or if worse. Go to ER if feeling really bad!  3)  Normal BP 130/85 or less 4)  Heart rate 60-80 is normal 5)  Use over-the-counter medicines for "cold": Tylenol  650mg  or Advil 400mg  every 6 hours  for fever; Delsym or Robutussin for cough. Mucinex for congestion. Ricola or Halls for sore throat. Office visit if not better or if worse.  Prescriptions: ZITHROMAX Z-PAK 250 MG TABS (AZITHROMYCIN) as dirrected  #1 x 0   Entered and Authorized by:   Tresa Garter MD   Signed by:   Tresa Garter MD on 08/07/2010   Method used:   Print then Give to Patient   RxID:   [id number]    Orders Added: 1)  TLB-BMP (Basic Metabolic Panel-BMET) [80048-METABOL] 2)  TLB-B12, Serum-Total ONLY [82607-B12] 3)  TLB-CBC Platelet - w/Differential [85025-CBCD] 4)  TLB-Hepatic/Liver Function Pnl [80076-HEPATIC] 5)  TLB-TSH (Thyroid Stimulating Hormone) [84443-TSH] 6)  TLB-Udip ONLY [81003-UDIP] 7)  EKG w/ Interpretation [93000] 8)  Est. Patient Level IV [14782]

## 2010-09-18 NOTE — Progress Notes (Signed)
Summary: Rx change  Phone Note Call from Patient Call back at Home Phone [phone]   Caller: Patient Summary of Call: Pt called stating that she had previously been taking Omeprazole 20mg  caps two times a day but was increased to 40mg  two times a day and unsure of the reason. Pt is requesting t o have Rx changed back to 20mg  caps two times a day, okay to change per patient preference? Initial call taken by: Margaret Pyle, CMA,  August 12, 2010 9:33 AM  Follow-up for Phone Call        ok  Thank you!  Follow-up by: Tresa Garter MD,  August 12, 2010 2:51 PM  Additional Follow-up for Phone Call Additional follow up Details #1::        Pt informed  Additional Follow-up by: Lamar Sprinkles, CMA,  August 12, 2010 4:40 PM    New/Updated Medications: OMEPRAZOLE 20 MG CPDR (OMEPRAZOLE) 1 two times a day Prescriptions: OMEPRAZOLE 20 MG CPDR (OMEPRAZOLE) 1 two times a day  #180 x 1   Entered by:   Lamar Sprinkles, CMA   Authorized by:   Tresa Garter MD   Signed by:   Lamar Sprinkles, CMA on 08/12/2010   Method used:   Electronically to        [id number] (retail)       [address]       [place], [place]  [address]       Ph: [phone] or [phone]       Fax: [phone]   RxID:   [id number] AMLODIPINE BESYLATE 5 MG TABS (AMLODIPINE BESYLATE) 1/2 tab by mouth bid  #90 x 3   Entered by:   Margaret Pyle, CMA   Authorized by:   Tresa Garter MD   Signed by:   Margaret Pyle, CMA on 08/12/2010   Method used:   Electronically to        [id number] (retail)       [address]       [place], [place]  [address]       Ph: [phone] or [phone]       Fax: [phone]   RxID:   [id number]

## 2010-09-18 NOTE — Letter (Signed)
Summary: Medication Reconciliation Letter  [facility]  [address] [place], [place] [address]   Phone: [phone]  Fax: [phone]     08/12/2010 MRN: [id number]  [facility] [address] [place], [place]  [address]  Dear Ms. Lovern,   Please take a moment to review and update your medication list. Medication List:  OMEPRAZOLE 20 MG CPDR (OMEPRAZOLE) 1 two times a day AMLODIPINE BESYLATE 5 MG TABS (AMLODIPINE BESYLATE) 1/2 tab by mouth two times a day DIAZEPAM 5 MG TABS (DIAZEPAM) 1/2 or 1 by mouth two times a day as needed anxiety ASPIRIN 81 MG  TBEC (ASPIRIN) one by mouth every day FISH OIL  OIL (FISH OIL) once daily LORATADINE 10 MG TABS (LORATADINE) 1 by mouth once daily as needed allergies VITAMIN B-12 1000 MCG  TABS (CYANOCOBALAMIN) 1 once daily VITAMIN D3 2000 UNIT CAPS (CHOLECALCIFEROL) Take 1 tablet by mouth once a day       Appended Document: Medication Reconciliation Letter Mailed to pt per her request

## 2010-09-18 NOTE — Miscellaneous (Signed)
  Medications Added AMLODIPINE BESYLATE 5 MG TABS (AMLODIPINE BESYLATE) 1/2 tab by mouth two times a day LORATADINE 10 MG TABS (LORATADINE) 1 by mouth once daily as needed allergies VITAMIN B-12 1000 MCG  TABS (CYANOCOBALAMIN) 1 once daily       Clinical Lists Changes  Medications: Changed medication from VITAMIN B-12 1000 MCG  TABS (CYANOCOBALAMIN) 1 qd to VITAMIN B-12 1000 MCG  TABS (CYANOCOBALAMIN) 1 once daily Changed medication from AMLODIPINE BESYLATE 5 MG TABS (AMLODIPINE BESYLATE) 1/2 tab by mouth bid to AMLODIPINE BESYLATE 5 MG TABS (AMLODIPINE BESYLATE) 1/2 tab by mouth two times a day Changed medication from LORATADINE 10 MG TABS (LORATADINE) 1 by mouth once daily as needed allergies to LORATADINE 10 MG TABS (LORATADINE) 1 by mouth once daily as needed allergies

## 2010-09-18 NOTE — Assessment & Plan Note (Signed)
Summary: BP//lch   Vital Signs:  Patient profile:   65 year old female Weight:      186.6 pounds Pulse rate:   124 / minute Pulse rhythm:   regular BP sitting:   132 / 96  (left arm) Cuff size:   large  Vitals Entered By: Almeta Monas CMA [facility]) (September 12, 2010 3:30 PM) CC: c/o BP being elevated, tachycardia and dizziness---per pt she has been having problems since her Norvasc was increased, Lipid Management   History of Present Illness: Pt is here with husband c/o elevated bp , palpatations and tachycardia.  Pt is obviously very anxious about this.  Pt denies any CP, SoB.    Lipid Management History:      Positive NCEP/ATP III risk factors include female age 70 years old or older and hypertension.  Negative NCEP/ATP III risk factors include HDL cholesterol greater than 60 and non-tobacco-user status.    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 1 Two Times A Day 2)  Amlodipine Besylate 5 Mg Tabs (Amlodipine Besylate) .... 1/2 Tab By Mouth Two Times A Day 3)  Diazepam 5 Mg Tabs (Diazepam) .... 1/2 or 1 By Mouth Two Times A Day As Needed Anxiety 4)  Aspirin 81 Mg  Tbec (Aspirin) .... One By Mouth Every Day 5)  Fish Oil  Oil (Fish Oil) .... Once Daily 6)  Loratadine 10 Mg Tabs (Loratadine) .Marland Kitchen.. 1 By Mouth Once Daily As Needed Allergies 7)  Vitamin B-12 1000 Mcg  Tabs (Cyanocobalamin) .Marland Kitchen.. 1 Once Daily 8)  Vitamin D3 2000 Unit Caps (Cholecalciferol) .... Take 1 Tablet By Mouth Once A Day  Current Medications (verified): 1)  Omeprazole 20 Mg Cpdr (Omeprazole) .Marland Kitchen.. 1 Two Times A Day 2)  Amlodipine Besylate 5 Mg Tabs (Amlodipine Besylate) .Marland Kitchen.. 1 Tab By Mouth Two Times A Day 3)  Diazepam 5 Mg Tabs (Diazepam) .... 1/2 or 1 By Mouth Two Times A Day As Needed Anxiety 4)  Aspirin 81 Mg  Tbec (Aspirin) .... One By Mouth Every Day 5)  Fish Oil  Oil (Fish Oil) .... Once Daily 6)  Loratadine 10 Mg Tabs (Loratadine) .Marland Kitchen.. 1 By Mouth Once Daily As Needed Allergies 7)  Vitamin B-12 1000 Mcg  Tabs (Cyanocobalamin) .Marland Kitchen.. 1 Once Daily 8)  Vitamin D3 2000 Unit Caps (Cholecalciferol) .... Take 1 Tablet By Mouth Once  A Day  Allergies (verified): 1)  ! Codeine 2)  ! Sulfa 3)  ! Augmentin 4)  ! 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 Allergic rhinitis Mild diastolic dysfanction on ECHO  4403  Past Surgical History: Last updated: 05/26/2007 Cholecystectomy  Family History: Last updated: 11/01/2008 Family History Hypertension Family History of Ovarian Cancer: Mother No FH of Colon Cancer: Family History of Diabetes: Sister  Social History: Last updated:  12/16/2009 Former Smoker Single lives with a female companion Alcoholic son - very stressed Alcohol Use - yes 2 glasses red wine occ Daily Caffeine Use 2-3 cups coffee Illicit Drug Use - no Retired 2010  Risk Factors: Smoking Status: quit (07/12/2007)  Family History: Reviewed history from 11/01/2008 and no changes required. Family History Hypertension Family History of Ovarian Cancer: Mother No FH of Colon Cancer: Family History of Diabetes: Sister  Social History: Reviewed history from 12/16/2009 and no changes required. Former Smoker Single lives with a female companion Alcoholic son - very stressed Alcohol Use - yes 2 glasses red wine occ Daily Caffeine Use 2-3 cups coffee Illicit Drug Use - no Retired 2010  Review of Systems      See HPI  Physical Exam  General:  Well-developed,well-nourished,in no acute distress; alert,appropriate and cooperative throughout examination Neck:  No deformities, masses, or tenderness noted. Lungs:  Normal respiratory effort, chest expands symmetrically. Lungs are clear to auscultation, no crackles or wheezes. Heart:  normal rate and no murmur.   Extremities:  No clubbing, cyanosis, edema, or deformity noted with normal full range of motion of all joints.   Skin:  Intact without suspicious lesions or rashes Cervical Nodes:  No lymphadenopathy noted Psych:  Oriented X3, good eye contact, and severely anxious.     Impression & Recommendations:  Problem # 1:  HYPERTENSION (ICD-401.9) Assessment Unchanged  Improved after pt sat for a while. Her updated medication list for this problem includes:    Amlodipine Besylate 5 Mg Tabs (Amlodipine besylate) .Marland Kitchen... 1 tab by mouth two times a day  BP today: 132/96 Prior BP: 168/100 (08/07/2010)  Labs Reviewed: K+: 5.1 (08/07/2010) Creat: : 0.8 (08/07/2010)   Chol: 274 (08/05/2006)   HDL: 85.7 (08/05/2006)   LDL: DEL (08/05/2006)   TG: 87 (08/05/2006)  Orders: EKG w/ Interpretation  (93000)  Problem # 2:  ANXIETY (ICD-300.00) Assessment: Deteriorated recent labs reviewed Her updated medication list for this problem includes:    Diazepam 5 Mg Tabs (Diazepam) .Marland Kitchen... 1/2 or 1 by mouth two times a day as needed anxiety  Discussed medication use and relaxation techniques.   Orders: EKG w/ Interpretation (93000)  Problem # 3:  TACHYCARDIA (ICD-785.0) Assessment: Deteriorated resolved with rest  Complete Medication List: 1)  Omeprazole 20 Mg Cpdr (Omeprazole) .Marland Kitchen.. 1 two times a day 2)  Amlodipine Besylate 5 Mg Tabs (Amlodipine besylate) .Marland Kitchen.. 1 tab by mouth two times a day 3)  Diazepam 5 Mg Tabs (Diazepam) .... 1/2 or 1 by mouth two times a day as needed anxiety 4)  Aspirin 81 Mg Tbec (Aspirin) .... One by mouth every day 5)  Fish Oil Oil (Fish oil) .... Once daily 6)  Loratadine 10 Mg Tabs (Loratadine) .Marland Kitchen.. 1 by mouth once daily as needed allergies 7)  Vitamin B-12 1000 Mcg Tabs (Cyanocobalamin) .Marland Kitchen.. 1 once daily 8)  Vitamin D3  2000 Unit Caps (Cholecalciferol) .... Take 1 tablet by mouth once a day  Other Orders: Cardiology Referral (Cardiology)  Lipid Assessment/Plan:      Based on NCEP/ATP III, the patient's risk factor category is "0-1 risk factors".  The patient's lipid goals are as follows: Total cholesterol goal is 200; LDL cholesterol goal is 160; HDL cholesterol goal is 40; Triglyceride goal is 150.      Orders Added: 1)  Cardiology Referral [Cardiology] 2)  Est. Patient Level IV [16109] 3)  EKG w/ Interpretation [93000]

## 2010-09-18 NOTE — Progress Notes (Signed)
  Phone Note Other Incoming   Caller: Ruby in billing Summary of Call: Pt called Ruby stating she was seen for sore throat on 06/12/10 for sore throat and this dx was no where in chart based on EOB she rec from medicare.  She states they wrote psyshiatric reduction on EOB because of her anxiety.   Initial call taken by: Lanier Prude, [facility]),  August 04, 2010 9:45 AM  Follow-up for Phone Call        OK - my fault - it was addressed and she was given antibiotic - I will make an addendum. I do not know what psych reduction is . She was discussing her anxiety and stress. What is the problem? Follow-up by: Tresa Garter MD,  August 04, 2010 5:30 PM  Additional Follow-up for Phone Call Additional follow up Details #1::        Ok please make the number 1 dx URI or sore throat pain. Then Ruby will contact [employer] so they can rebill it because we have already been paid. Additional Follow-up by: Lanier Prude, [facility]),  August 05, 2010 2:01 PM    Additional Follow-up for Phone Call Additional follow up Details #2::    Please make URI #1 dx Thank you!  Follow-up by: Tresa Garter MD,  August 05, 2010 5:12 PM

## 2010-09-24 NOTE — Progress Notes (Signed)
Summary: Monitor ?  Phone Note Call from Patient   Summary of Call: Patient is requesting a call back regarding tests ordered when she was seen at [facility].    Initial call taken by: Lamar Sprinkles, CMA,  September 15, 2010 3:48 PM  Follow-up for Phone Call        PT WANTS TO KNOW IF SHE SHOULD TAKE ALL OF HER MEDS WHILE WEARING A HEART MONITOR.  CALL HER CELL AT [phone] Follow-up by: Hilarie Fredrickson,  September 16, 2010 8:33 AM  Additional Follow-up for Phone Call Additional follow up Details #1::        yes, please Additional Follow-up by: Tresa Garter MD,  September 16, 2010 1:32 PM    Additional Follow-up for Phone Call Additional follow up Details #2::    Pt informed  Follow-up by: Lamar Sprinkles, CMA,  September 16, 2010 3:26 PM

## 2010-09-26 ENCOUNTER — Emergency Department (HOSPITAL_COMMUNITY)
Admission: EM | Admit: 2010-09-26 | Discharge: 2010-09-26 | Disposition: A | Discharge status: Home / Self Care | Payer: Medicare Other | Attending: Emergency Medicine | Admitting: Emergency Medicine

## 2010-09-26 DIAGNOSIS — I10 Essential (primary) hypertension: Secondary | ICD-10-CM | POA: Insufficient documentation

## 2010-09-26 DIAGNOSIS — IMO0001 Reserved for inherently not codable concepts without codable children: Secondary | ICD-10-CM | POA: Insufficient documentation

## 2010-09-26 DIAGNOSIS — F411 Generalized anxiety disorder: Secondary | ICD-10-CM | POA: Insufficient documentation

## 2010-09-26 DIAGNOSIS — I2510 Atherosclerotic heart disease of native coronary artery without angina pectoris: Secondary | ICD-10-CM | POA: Insufficient documentation

## 2010-09-26 DIAGNOSIS — R000 Tachycardia, unspecified: Secondary | ICD-10-CM | POA: Insufficient documentation

## 2010-09-26 DIAGNOSIS — R002 Palpitations: Secondary | ICD-10-CM | POA: Insufficient documentation

## 2010-09-26 DIAGNOSIS — I059 Rheumatic mitral valve disease, unspecified: Secondary | ICD-10-CM | POA: Insufficient documentation

## 2010-09-26 LAB — COMPREHENSIVE METABOLIC PANEL
ALT: 39 U/L — ABNORMAL HIGH (ref 0–35)
AST: 40 U/L — ABNORMAL HIGH (ref 0–37)
Chloride: 103 mEq/L (ref 96–112)
GFR calc Af Amer: >60 mL/min (ref >60)
GFR calc non Af Amer: >60 mL/min (ref >60)
Potassium: 3.4 mEq/L — ABNORMAL LOW (ref 3.5–5.1)
Sodium: 139 mEq/L (ref 135–145)
Total Bilirubin: 0.6 mg/dL (ref 0.3–1.2)
Total Protein: 7.8 g/dL (ref 6.0–8.3)

## 2010-09-26 LAB — POCT CARDIAC MARKERS
Myoglobin, poc: 86.6 ng/mL (ref 12–200)
Troponin i, poc: <0.05 ng/mL (ref 0.00–0.09)

## 2010-09-26 LAB — CBC
Hemoglobin: 15.3 g/dL — ABNORMAL HIGH (ref 12.0–15.0)
MCHC: 34.7 g/dL (ref 30.0–36.0)
MCV: 89.3 fL (ref 78.0–100.0)
Platelets: 215 10*3/uL (ref 150–400)
RDW: 13.4 % (ref 11.5–15.5)

## 2010-09-26 LAB — DIFFERENTIAL
Basophils Relative: 0 % (ref 0–1)
Eosinophils Absolute: 0.1 10*3/uL (ref 0.0–0.7)
Eosinophils Relative: 1 % (ref 0–5)
Lymphocytes Relative: 35 % (ref 12–46)
Lymphs Abs: 3.2 10*3/uL (ref 0.7–4.0)
Neutrophils Relative %: 58 % (ref 43–77)

## 2010-09-29 ENCOUNTER — Ambulatory Visit (INDEPENDENT_AMBULATORY_CARE_PROVIDER_SITE_OTHER): Payer: Medicare Other | Admitting: Internal Medicine

## 2010-09-29 ENCOUNTER — Encounter: Payer: Self-pay | Admitting: Internal Medicine

## 2010-09-29 DIAGNOSIS — I519 Heart disease, unspecified: Secondary | ICD-10-CM

## 2010-09-29 DIAGNOSIS — I10 Essential (primary) hypertension: Secondary | ICD-10-CM

## 2010-09-29 DIAGNOSIS — R002 Palpitations: Secondary | ICD-10-CM

## 2010-10-06 ENCOUNTER — Ambulatory Visit: Payer: Medicare Other | Admitting: Cardiovascular Disease

## 2010-10-06 ENCOUNTER — Ambulatory Visit (INDEPENDENT_AMBULATORY_CARE_PROVIDER_SITE_OTHER): Payer: Medicare Other | Admitting: Cardiovascular Disease

## 2010-10-06 ENCOUNTER — Encounter: Payer: Self-pay | Admitting: Internal Medicine

## 2010-10-06 DIAGNOSIS — I10 Essential (primary) hypertension: Secondary | ICD-10-CM

## 2010-10-06 DIAGNOSIS — I119 Hypertensive heart disease without heart failure: Secondary | ICD-10-CM

## 2010-10-06 DIAGNOSIS — R002 Palpitations: Secondary | ICD-10-CM

## 2010-10-08 NOTE — Assessment & Plan Note (Signed)
Summary: FU--FROM AN IRREGULAR HEARTBEAT----STC   Vital Signs:  Patient profile:   65 year old female Height:      64 inches Weight:      181 pounds BMI:     31.18 O2 Sat:      97 % on Room air Temp:     98.1 degrees F oral Pulse rate:   90 / minute BP sitting:   138 / 88  (left arm) Cuff size:   large  Vitals Entered By: Bill Salinas CMA (September 29, 2010 9:58 AM)  O2 Flow:  Room air  Primary Care Provider:  Sonda Primes MD   History of Present Illness: F/u palpitations, HTN She had her heart racing on 1/27 and saw Dr Laury Axon She went to [facility] ER in [place] last Fri for palpitations  Current Medications (verified): 1)  Omeprazole 20 Mg Cpdr (Omeprazole) .Marland Kitchen.. 1 Two Times A Day 2)  Amlodipine Besylate 5 Mg Tabs (Amlodipine Besylate) .... 1/2 Two Times A Day 3)  Diazepam 5 Mg Tabs (Diazepam) .... 1/2 or 1 By Mouth Two Times A Day As Needed Anxiety 4)  Aspirin 81 Mg  Tbec (Aspirin) .... One By Mouth Every Day 5)  Fish Oil  Oil (Fish Oil) .... Once Daily 6)  Loratadine 10 Mg Tabs (Loratadine) .Marland Kitchen.. 1 By Mouth Once Daily As Needed Allergies 7)  Vitamin B-12 1000 Mcg  Tabs (Cyanocobalamin) .... 1/2  Once Daily 8)  Vitamin D3 2000 Unit Caps (Cholecalciferol) .... Take 1 Tablet By Mouth Once A Day  Allergies (verified): 1)  ! Codeine 2)  ! Sulfa 3)  ! Augmentin 4)  ! 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    Physical Exam  General:  Well-developed,well-nourished,in no acute distress; alert,appropriate and cooperative throughout examination Nose:  External nasal examination shows no deformity or inflammation. Nasal mucosa are pink and moist without lesions or exudates. Mouth:  No deformity or lesions, dentition normal. Neck:  No deformities, masses, or tenderness noted. Lungs:  Normal respiratory effort, chest expands symmetrically. Lungs are clear to auscultation, no crackles or wheezes. Heart:  Slight tachy, normal rate and no murmur.   Abdomen:  obese, soft without tenderness mass or hernia good bowel sounds heard there are surgical incision well healed Msk:  WNL Neurologic:  Alert and  oriented x4;  grossly normal neurologically. Skin:  Intact without suspicious lesions or rashes Psych:  Oriented X3, good eye contact, and slightely anxious.  Talkative.   Impression & Recommendations:  Problem # 1:  PALPITATIONS, RECURRENT (ICD-785.1) Assessment Deteriorated  S. tachy. She is using a monitor. Her previous records incl ER notes, Dr Ernst Spell notes, EKGs were reviewed as well as previous ECHO Her updated medication list for this problem includes:    Toprol Xl 25 Mg Xr24h-tab (Metoprolol succinate) .Marland Kitchen... 1 by mouth once daily ADDED  Orders: Cardiology Referral (Cardiology)  Problem # 2:  HYPERTENSION (ICD-401.9)  Assessment: Unchanged  Her updated medication list for this problem includes:    Toprol Xl 25 Mg Xr24h-tab (Metoprolol succinate) .Marland Kitchen... 1 by mouth once daily    Amlodipine Besylate 5 Mg Tabs (Amlodipine besylate) .Marland Kitchen... 1/2 two times a day - she  stopped taking it. Risks of noncompliance with treatment discussed. Compliance encouraged.   Problem # 3:  ANXIETY (ICD-300.00) Assessment: Unchanged Declined Buspar, SSRIs Her updated medication list for this problem includes:    Diazepam 5 Mg Tabs (Diazepam) .Marland Kitchen... 1/2 or 1 by  mouth two times a day as needed anxiety  Problem # 4:  DIASTOLIC DYSFUNCTION (ICD-429.9) Assessment: Unchanged On Norvasc. She has some issues with tolerance...  Complete Medication List: 1)  Toprol Xl 25 Mg Xr24h-tab (Metoprolol succinate) .Marland Kitchen.. 1 by mouth once daily 2)  Omeprazole 20 Mg Cpdr (Omeprazole) .Marland Kitchen.. 1 two times a day 3)  Amlodipine Besylate 5 Mg Tabs (Amlodipine besylate) .... 1/2 two times a day 4)  Diazepam 5 Mg Tabs (Diazepam) .... 1/2 or 1 by mouth two times a day as needed anxiety 5)  Aspirin 81 Mg Tbec (Aspirin) .... One by mouth every day 6)  Fish Oil Oil (Fish oil) .... Once daily 7)  Loratadine 10 Mg Tabs (Loratadine) .Marland Kitchen.. 1 by mouth once daily as needed allergies 8)  Vitamin B-12 1000 Mcg Tabs (Cyanocobalamin) .... 1/2  once daily 9)  Vitamin D3 2000 Unit Caps (Cholecalciferol) .... Take 1 tablet by mouth once a day  Patient Instructions: 1)  Please schedule a follow-up appointment in 2 months. 2)  Call if you are not better in a reasonable amount of time or if worse. Go to ER if feeling really bad!  Prescriptions: TOPROL XL 25 MG XR24H-TAB (METOPROLOL SUCCINATE) 1 by mouth once daily  #30 x 11   Entered and Authorized by:   Tresa Garter MD   Signed by:   Tresa Garter MD on 09/29/2010   Method used:   Electronically to        [id number] (retail)       [address]       [place], [place]  [address]       Ph: [phone] or [phone]       Fax: [phone]   RxID:   [id number]    Orders Added: 1)  Cardiology Referral [Cardiology] 2)  Est. Patient Level IV [59563]

## 2010-10-17 ENCOUNTER — Encounter: Payer: Self-pay | Admitting: Internal Medicine

## 2010-10-23 NOTE — Letter (Signed)
Summary: [facility]   Imported By: Lennie Odor 10/17/2010 15:43:13  _____________________________________________________________________  External Attachment:    Type:   Image     Comment:   External Document

## 2010-11-04 NOTE — Procedures (Signed)
Summary: Summary Report  Summary Report   Imported By: Erle Crocker 10/31/2010 13:28:53  _____________________________________________________________________  External Attachment:    Type:   Image     Comment:   External Document

## 2010-12-04 ENCOUNTER — Telehealth: Payer: Self-pay | Admitting: Cardiovascular Disease

## 2010-12-04 NOTE — Telephone Encounter (Signed)
PT CALLED, SAID RECV A CALL HAS AN APPT FOR Monday 04/23. RESEARCHED AND LOOKS LIKE THIS IS IN ERROR AS IT WAS WITH ANOTHER MD. PT HAS FASTING LABS TO RECHECK CHOLESTEROL 05/24 AND A RECALL IN THE SYSTEM FOR A RETURN APPT IN AUG FOR A OV. PT CONFUSED AS SAID HAD A HEART MONITOR, WORE IT TURNED IT IN AND HAS NOT HEARD ANYTHING. THERE IS NO INFO IN THE CHART. PLACED CHART IN BOX.

## 2010-12-04 NOTE — Telephone Encounter (Signed)
L/M for pt to call back -this was a return call

## 2010-12-04 NOTE — Telephone Encounter (Signed)
Called results of the holter to the pt.  Per Dr. Elease Hashimoto:  No problems noted on the holter;  Pt was notified.

## 2010-12-08 ENCOUNTER — Ambulatory Visit: Payer: Medicare Other | Admitting: Cardiology

## 2010-12-16 ENCOUNTER — Other Ambulatory Visit: Payer: Self-pay | Admitting: *Deleted

## 2010-12-16 DIAGNOSIS — E7800 Pure hypercholesterolemia, unspecified: Secondary | ICD-10-CM

## 2010-12-30 NOTE — Assessment & Plan Note (Signed)
[facility]                         GASTROENTEROLOGY OFFICE NOTE   MOSELLA, KASA                        MRN:          [id number]  DATE:04/29/2007                            DOB:          11-06-1945    Patient is self-referred.   REASON FOR EVALUATION:  Reflux disease and to establish as a new GI  patient.   HISTORY:  This is a 65 year old white female with a history of  fibromyalgia, gastroesophageal reflux disease complicated by erosive  esophagitis and peptic stricture, irritable bowel syndrome,  dyslipidemia, and obesity.  She has been a longstanding GI patient of  Dr. Victorino Dike.  Dr. Corinda Gubler has followed her for colon polyps, and the  patient has undergone multiple colonoscopies.  The most recent exam in  September, 2005.  Examination was entirely normal and followup in seven  years recommended.  She is also followed for reflux disease.  Her last  upper endoscopy was in September, 2005.  She had been on Nexium 40 mg  daily with good control of her reflux symptoms.  She had been on  multiple other proton pump inhibitors in the past without equal success.  Recently, due to insurance formulary preference, she was changed to  omeprazole 20 mg b.i.d.  The medication certainly works better than no  medication, although not nearly as well as Nexium.  She wonders if I  might not appeal on her behalf to the insurance company.   Her current GI symptoms include gas and bloating, which are chronic.  Bowel habits currently are not a problem.  No bleeding.   Current medications include vitamin B complex, fish oil, calcium,  vitamin D, and omeprazole.   PHYSICAL EXAMINATION:  A well-appearing female in no acute distress.  Blood pressure is 152/72, heart rate 100 and regular, weight is 206.4  pounds.  HEENT:  Sclerae are anicteric.  Conjunctivae are pink.  Oral mucosa is  intact.  LUNGS:  Clear.  HEART:  Regular.  ABDOMEN:  Obese and soft without  tenderness, mass, or hernia.  Good  bowel sounds heard.  No organomegaly.  EXTREMITIES:  Without edema.   IMPRESSION:  1. Gastroesophageal reflux disease complicated by erosive esophagitis      and peptic stricture.  Currently with breakthrough symptoms on      omeprazole 20 mg b.i.d.  2. History of colon polyps:  Surveillance up to date.  3. General medical problems:  Under the care of Dr. Posey Rea.   RECOMMENDATIONS:  1. Prescription for Nexium 40 mg daily.  As well, multiple samples      provided.  I did write on the prescription that the patient has      tried multiple other proton pump inhibitors as well as recommended      omeprazole without satisfactory results.  I do think it is      imperative that she have excellent control of symptoms due to the      fact that she has had complicated disease as manifested by erosive      changes and stricturing.  2. Reflux precaution with  attention to weight loss.  3. Keep planned surveillance for 2012 unless otherwise clinically      indicated.  4. Ongoing general medical care with Dr. Posey Rea.     Wilhemina Bonito. Marina Goodell, MD  Electronically Signed    JNP/MedQ  DD: 04/29/2007  DT: 04/30/2007  Job #: [id number]

## 2010-12-31 ENCOUNTER — Telehealth: Payer: Self-pay | Admitting: *Deleted

## 2010-12-31 NOTE — Telephone Encounter (Signed)
She is scheduled for Friday BUT patient requesting OV this pm or tomorrow. Pt c/o sinus infection with productive cough w/blood streaked mucus. Please advise.

## 2010-12-31 NOTE — Telephone Encounter (Signed)
Ok on Cyrus 1 pm Thx

## 2011-01-01 ENCOUNTER — Ambulatory Visit (INDEPENDENT_AMBULATORY_CARE_PROVIDER_SITE_OTHER): Payer: Medicare Other | Admitting: Internal Medicine

## 2011-01-01 ENCOUNTER — Telehealth: Payer: Self-pay | Admitting: *Deleted

## 2011-01-01 ENCOUNTER — Other Ambulatory Visit: Payer: Self-pay | Admitting: Internal Medicine

## 2011-01-01 ENCOUNTER — Encounter: Payer: Self-pay | Admitting: Internal Medicine

## 2011-01-01 DIAGNOSIS — J309 Allergic rhinitis, unspecified: Secondary | ICD-10-CM

## 2011-01-01 DIAGNOSIS — J069 Acute upper respiratory infection, unspecified: Secondary | ICD-10-CM

## 2011-01-01 DIAGNOSIS — F411 Generalized anxiety disorder: Secondary | ICD-10-CM

## 2011-01-01 DIAGNOSIS — R05 Cough: Secondary | ICD-10-CM

## 2011-01-01 MED ORDER — CEFUROXIME AXETIL 500 MG PO TABS: 500.0000 mg | ORAL_TABLET | Freq: Two times a day (BID) | ORAL | Status: AC

## 2011-01-01 MED ORDER — DIAZEPAM 5 MG PO TABS: 2.5000 mg | ORAL_TABLET | Freq: Two times a day (BID) | ORAL | Status: DC | PRN

## 2011-01-01 MED ORDER — MOXIFLOXACIN HCL 400 MG PO TABS: 400.0000 mg | ORAL_TABLET | Freq: Every day | ORAL | Status: AC

## 2011-01-01 MED ORDER — HYDROCODONE-HOMATROPINE 5-1.5 MG/5ML PO SYRP: 5.0000 mL | ORAL_SOLUTION | Freq: Three times a day (TID) | ORAL | Status: AC | PRN

## 2011-01-01 MED ORDER — LORATADINE 10 MG PO TABS: 10.0000 mg | ORAL_TABLET | Freq: Every day | ORAL | Status: DC

## 2011-01-01 NOTE — Patient Instructions (Signed)
Use over-the-counter  "cold" medicines  such as "Tylenol cold" , "Advil cold",  "Mucinex" or" Mucinex D"  for cough and congestion.   Avoid decongestants if you have high blood pressure and use "Afrin" nasal spray for nasal congestion as directed instead. Use" Delsym" or" Robitussin" cough syrup varietis for cough.  You can use plain "Tylenol" or "Advil" for fever, chills and achyness.  

## 2011-01-01 NOTE — Telephone Encounter (Signed)
Patient informed. 

## 2011-01-01 NOTE — Telephone Encounter (Signed)
[facility] has no generic, cost to pt is 75$. She would like alt RX that has generic.

## 2011-01-01 NOTE — Telephone Encounter (Signed)
Please help get this coordinated, thanks!

## 2011-01-01 NOTE — Telephone Encounter (Signed)
OK Ceftin 500 mg po bid x 10 d #20 and no ref Thx

## 2011-01-02 ENCOUNTER — Ambulatory Visit: Payer: Medicare Other | Admitting: Internal Medicine

## 2011-01-02 NOTE — Assessment & Plan Note (Signed)
[facility]                           GASTROENTEROLOGY OFFICE NOTE   Laura Vasquez, Laura Vasquez                        MRN:          [id number]  DATE:03/31/2006                            DOB:          1946/02/12    Laura Vasquez says she comes in and says she is feeling pretty good.  She is still  having some acid reflux at night, worse when she lies down, especially after  she eats.  Nexium seems to help but sometimes she has to take two a day and  he insurance company is reluctant to give this to her.  We had a long  conversation about different medical problems that she has had and her  future with the practice with her [facility] and her primary  care doctors and so on.  She is a little unrealistic about health care and  what she expects from physicians and so on, and I do not think this is  unusual with the general public, and I tried to enlighten her to some  degree.  Otherwise, she says she is doing relatively well.  She does have  fibromyalgia and she has problems with this with pain, so I did tell her  that I would give her a trial of some Darvocet to see if this would help her  some to relieve some of her discomfort.  I suggested to her using Ambien or  the equivalent of some kind of sleeping medication because she says she has  a very difficult time sleeping.   MEDICATIONS:  1. Nexium 40 mg daily or b.i.d.  2. Vitamin.  3. Vitamin B complex.  4. Fish oil.  5. Calcium.   Otherwise, she does not have many requirements of medicine.  At one time I  wanted to put her on Effexor, but she obviously has not continued to take  this.   PHYSICAL EXAMINATION:  VITALS:  Weight 201.  Blood pressure 150/96.  Pulse  88 and regular.  HEENT: EOMI. PERRLA. Sclerae are anicteric.  Conjunctivae are pink.  NECK:  Supple without thyromegaly, adenopathy or carotid bruits.  CHEST:  Clear to auscultation and percussion without adventitious sounds.  CARDIAC:   Regular rhythm; normal S1 S2.  There are no murmurs, gallops or  rubs.  ABDOMEN:  Bowel sounds are normoactive.  Abdomen is soft, non-tender and non-  distended.  There are no abdominal masses, tenderness, splenic enlargement  or hepatomegaly.  EXTREMITIES:  Full range of motion.  No cyanosis, clubbing or edema.  RECTAL:  Deferred.   IMPRESSION:  1. Gastroesophageal reflux disease with some breakthrough.  2. Mild obesity.  3. History of alkaline gastritis with delayed gastric emptying and a large      hiatal hernia.  4. Irritable bowel syndrome.  5. History of fibromyalgia.  6. Sleep disorder.  7. Anxiety and depression.   RECOMMENDATION:  Try different PPIs to at least take two a day.  I really  think she needs to be on two a day.  I gave her some Prilosec to try b.i.d.  since the insurance  company suggested this.  I told her to take one in the  morning before breakfast and one before dinner instead of at bedtime and to  elevate the head of her bed if she could.                                   Ulyess Mort, MD   SML/MedQ  DD:  03/31/2006  DT:  03/31/2006  Job #:  [id number]

## 2011-01-04 ENCOUNTER — Encounter: Payer: Self-pay | Admitting: Internal Medicine

## 2011-01-04 NOTE — Progress Notes (Signed)
  Subjective:    Patient ID: Laura Vasquez, female    DOB: [birth year], 65 y.o.   MRN: [id number]  HPI   SUBJECTIVE:  Laura Vasquez is a 65 y.o. female who complains of congestion for 7 days. She denies a history of chills and has no history of asthma. Patient denies smoke cigarettes. Green d/c F/u anxiety and allergies.   OBJECTIVE: She appears well, vital signs are as noted. Ears normal.  Throat and pharynx normal.  Neck supple. No adenopathy in the neck. Nose is congested. Sinuses non tender. The chest is clear, without wheezes or rales.  ASSESSMENT:  1.viral upper respiratory illness and sinusitis 2. Anxiety 3. Alllergies  PLAN: Symptomatic therapy suggested: push fluids, rest and return office visit prn if symptoms persist or worsen. Lack of antibiotic effectiveness discussed with her. Call or return to clinic prn if these symptoms worsen or fail to improve as anticipated. Abx was given See meds and orders for #2-3 Review of Systems     Objective:   Physical Exam        Assessment & Plan:

## 2011-01-08 ENCOUNTER — Other Ambulatory Visit: Payer: Medicare Other | Admitting: *Deleted

## 2011-01-22 ENCOUNTER — Other Ambulatory Visit: Payer: Medicare Other | Admitting: *Deleted

## 2011-02-11 ENCOUNTER — Other Ambulatory Visit: Payer: Medicare Other | Admitting: *Deleted

## 2011-04-01 ENCOUNTER — Ambulatory Visit (INDEPENDENT_AMBULATORY_CARE_PROVIDER_SITE_OTHER): Payer: Medicare Other | Admitting: Cardiovascular Disease

## 2011-04-01 ENCOUNTER — Encounter: Payer: Self-pay | Admitting: Cardiovascular Disease

## 2011-04-01 ENCOUNTER — Telehealth: Payer: Self-pay | Admitting: Cardiovascular Disease

## 2011-04-01 VITALS — BP 150/96 | HR 101 | Ht 64.0 in | Wt 184.2 lb

## 2011-04-01 DIAGNOSIS — I10 Essential (primary) hypertension: Secondary | ICD-10-CM

## 2011-04-01 DIAGNOSIS — E7800 Pure hypercholesterolemia, unspecified: Secondary | ICD-10-CM

## 2011-04-01 MED ORDER — PROPRANOLOL HCL 10 MG PO TABS: 10.0000 mg | ORAL_TABLET | Freq: Three times a day (TID) | ORAL | Status: DC

## 2011-04-01 NOTE — Telephone Encounter (Signed)
App given today

## 2011-04-01 NOTE — Assessment & Plan Note (Signed)
She presents today with some hypertension as well some palpitations. She brought her blood pressure log with her. All of her readings are in the normal range.  Cannot explain her hypertension and tachycardia today. I suspected she may be a little bit anxious. I've given her a prescription for propranolol to take on an as-needed basis. She informed that she has been given propranolol in the past. I've encouraged her to take a tablet on an as-needed basis if her blood pressure goes up. I'll see her again in 6 months. We'll check lipids at that time.

## 2011-04-01 NOTE — Progress Notes (Signed)
Laura Vasquez Date of Birth  07-24-1946 [facility] [address]     [address] [place], [place]  [address] [phone]  Fax  [phone]  History of Present Illness:  Laura Vasquez is a 65 year old female with a history of hypercholesterolemia, fibromyalgia, and anxiety. She also has mild mitral regurgitation. She is seen today for work in visit for hypertension.  She's been measuring her blood pressure since she last saw me several months ago. All of her blood pressure readings at home have been quite good.  She's not under any stress. She stays away from extra salt.  She's been sleeping well.  Current Outpatient Prescriptions on File Prior to Visit  Medication Sig Dispense Refill  . aspirin 81 MG tablet Take 81 mg by mouth daily.        . cholecalciferol (VITAMIN D) 1000 UNITS tablet Take 2,000 Units by mouth daily.        . cyanocobalamin 1000 MCG tablet Take 500 mcg by mouth daily.        . fluticasone (FLONASE) 50 MCG/ACT nasal spray Place 2 sprays into the nose. Taking as Needed      . Omega-3 Fatty Acids (FISH OIL PO) Take 1 each by mouth daily.        Marland Kitchen omeprazole (PRILOSEC) 20 MG capsule Take 20 mg by mouth 2 (two) times daily.        Marland Kitchen DISCONTD: diazepam (VALIUM) 5 MG tablet Take 0.5-1 tablets (2.5-5 mg total) by mouth 2 (two) times daily as needed.  30 tablet  2  . DISCONTD: loratadine (CLARITIN) 10 MG tablet Take 1 tablet (10 mg total) by mouth daily.  100 tablet  3    Allergies  Allergen Reactions  . Codeine   . ZHY:QMVHQIONGEX+BMWUXLKGM+WNUUVOZDGU Acid+Aspartame   . Naproxen   . Propranolol Hcl     REACTION: tired  . Sulfonamide Derivatives     Past Medical History  Diagnosis Date  . Anxiety   . Hypertension   . Allergy   . GERD (gastroesophageal reflux disease)   . Hyperlipidemia   . Fibromyalgia   . Mitral regurgitation   . Palpitations     Past Surgical History  Procedure Date  . Cholecystectomy   . Tubal ligation      History  Smoking status  . Former Smoker  . Quit date: 08/18/1991  Smokeless tobacco  . Not on file    History  Alcohol Use: Not on file    Family History  Problem Relation Age of Onset  . Hypertension Mother   . Ovarian cancer Mother   . Hypertension Father   . Ovarian cancer Sister   . Fibromyalgia Sister   . Fibromyalgia Sister     Reviw of Systems:  Reviewed in the HPI.  All other systems are negative.  Physical Exam: BP 150/96  Pulse 101  Ht 5\' 4"  (1.626 m)  Wt 184 lb 3.2 oz (83.553 kg)  BMI 31.62 kg/m2 The patient is alert and oriented x 3.  The mood and affect are normal.   Skin: warm and dry.  Color is normal.    HEENT:   the sclera are nonicteric.  The mucous membranes are moist.  The carotids are 2+ without bruits.  There is no thyromegaly.  There is no JVD.    Lungs: clear.  The chest wall is non tender.    Heart: regular rate with a normal S1 and S2.  There are no  murmurs, gallops, or rubs. The PMI is not displaced.     Abdomen: good bowel sounds.  There is no guarding or rebound.  There is no hepatosplenomegaly or tenderness.  There are no masses.   Extremities:  no clubbing, cyanosis, or edema.  The legs are without rashes.  The distal pulses are intact.   Neuro:  Cranial nerves II - XII are intact.  Motor and sensory functions are intact.    The gait is normal.  ECG:  Assessment / Plan:

## 2011-04-01 NOTE — Telephone Encounter (Signed)
Pt states her blood pressure was high, last reading was 117/58 pulse 71, however before it previous checks were as follows 137/92 pulse 85, bp 157/99 pulse 90, 177/103 before that, all of these blood pressure checks were done in the last day, pt would like to be seen sooner than October, please call pt back to discuss symptoms

## 2011-04-01 NOTE — Telephone Encounter (Signed)
Calling to see if the escript that was sent for Laura Vasquez's Inderal was supposed to be for 30 days and not 10. Please call back.

## 2011-04-02 ENCOUNTER — Encounter: Payer: Self-pay | Admitting: Cardiovascular Disease

## 2011-04-02 ENCOUNTER — Telehealth: Payer: Self-pay | Admitting: Cardiovascular Disease

## 2011-04-02 NOTE — Telephone Encounter (Signed)
Called because her Inderal prescription dosage is different from the dosage that her and Dr. Elease Hashimoto spoke about during her visit the other day, and now she is confused. Please call back.

## 2011-04-03 ENCOUNTER — Telehealth: Payer: Self-pay | Admitting: Cardiovascular Disease

## 2011-04-03 ENCOUNTER — Other Ambulatory Visit: Payer: Self-pay | Admitting: *Deleted

## 2011-04-03 DIAGNOSIS — I10 Essential (primary) hypertension: Secondary | ICD-10-CM

## 2011-04-03 MED ORDER — PROPRANOLOL HCL 10 MG PO TABS: 10.0000 mg | ORAL_TABLET | Freq: Three times a day (TID) | ORAL | Status: DC

## 2011-04-03 MED ORDER — PROPRANOLOL HCL 10 MG PO TABS: 10.0000 mg | ORAL_TABLET | Freq: Four times a day (QID) | ORAL | Status: DC | PRN

## 2011-04-03 NOTE — Telephone Encounter (Signed)
Pt has questions regarding prescription for

## 2011-04-03 NOTE — Telephone Encounter (Signed)
Explained to pt that propranolol was as an as needed med, script changed to reflect that and reorderedJodette Vasquez/ranger .

## 2011-04-03 NOTE — Telephone Encounter (Signed)
Fax received from pharmacy. Refill completed. [name] Rudi Bunyard RN  

## 2011-04-03 NOTE — Telephone Encounter (Signed)
Has a question about her medications °

## 2011-04-29 ENCOUNTER — Encounter: Payer: Self-pay | Admitting: Internal Medicine

## 2011-05-14 ENCOUNTER — Telehealth: Payer: Self-pay | Admitting: Internal Medicine

## 2011-05-14 NOTE — Telephone Encounter (Signed)
Pt last seen 10/2008, she has a history of IBS. Last colon/endo with Dr. Victorino Dike in 2005. Pt received her recall letter in the mail for her colon. Pt states she has several friends that see Dr. Jarold Motto and she really likes his jovial bedside manner and would like to switch to Dr. Jarold Motto. Dr. Marina Goodell will you approve the change?

## 2011-05-14 NOTE — Telephone Encounter (Signed)
Dr. Jarold Motto will you accept this pt? Please advise.

## 2011-05-14 NOTE — Telephone Encounter (Signed)
Sure.  No problem.

## 2011-05-14 NOTE — Telephone Encounter (Signed)
Chart in Dr. Norval Gable inbox for review. Dr. Jarold Motto please let me know if you will accept this patient.

## 2011-05-14 NOTE — Telephone Encounter (Signed)
Dr Jarold Motto would like the old paper chart to review for change of MDs. Thanks!

## 2011-05-15 NOTE — Telephone Encounter (Signed)
Pt scheduled to see Dr. Jarold Motto 06/05/11@10am . Pt aware of appt date and time.

## 2011-05-15 NOTE — Telephone Encounter (Signed)
yes

## 2011-06-05 ENCOUNTER — Other Ambulatory Visit: Payer: Self-pay | Admitting: Gynecology

## 2011-06-05 ENCOUNTER — Encounter: Payer: Self-pay | Admitting: Gastroenterology

## 2011-06-05 ENCOUNTER — Ambulatory Visit (INDEPENDENT_AMBULATORY_CARE_PROVIDER_SITE_OTHER): Payer: Medicare Other | Admitting: Gastroenterology

## 2011-06-05 DIAGNOSIS — R1012 Left upper quadrant pain: Secondary | ICD-10-CM

## 2011-06-05 DIAGNOSIS — K589 Irritable bowel syndrome without diarrhea: Secondary | ICD-10-CM

## 2011-06-05 DIAGNOSIS — Z1231 Encounter for screening mammogram for malignant neoplasm of breast: Secondary | ICD-10-CM

## 2011-06-05 DIAGNOSIS — K219 Gastro-esophageal reflux disease without esophagitis: Secondary | ICD-10-CM

## 2011-06-05 MED ORDER — HYOSCYAMINE SULFATE 0.125 MG PO TABS: 0.1250 mg | ORAL_TABLET | ORAL | Status: AC | PRN

## 2011-06-05 MED ORDER — PEG-KCL-NACL-NASULF-NA ASC-C 100 G PO SOLR: 1.0000 | Freq: Once | ORAL | Status: DC

## 2011-06-05 NOTE — Progress Notes (Signed)
History of Present Illness:  This is a very nice but very complex 65 year old Caucasian female former patient of Dr. Terrial Rhodes. She has a long history of acid reflux and is doing well on Prilosec 20 mg a day. She denies dysphagia, chest pain, or hepatobiliary problems. She is status post laparoscopic cholecystectomy by Dr. Ovidio Kin. Patient also has a long history of hyperplastic polyps with last colonoscopy 7 years ago. Family history is remarkable for diverticulitis and one of her sisters, but no known colon cancer. As colonoscopy was in 2005. She's had previous rectal surgery by Dr. Kendrick Ranch for what sounds like a possible anal papilla. Patient also has had total abdominal hysterectomy, removal of her ovaries, and appendectomy. She has regular bowel movements but has occasional left upper quadrant discomfort with gas and bloating. So she gives a history of possible intermittent dysphagia in her upper esophageal area. There are no hepatobiliary symptoms such as clay colored stools, dark urine, icterus, fever chills or other systemic complaints. Patient is on calcium and vitamin D because of osteopenia. Has a history of multiple drug allergies.  I have reviewed this patient's present history, medical and surgical past history, allergies and medications.     ROS: The remainder of the 10 point ROS is negative... 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 She also has a history of periodic supraventricular tachycardia. She is followed from a gynecologic standpoint point by Dr. Beather Arbour with yearly exams.     Physical Exam: General well developed well nourished patient in no acute distress, appearing her stated age Eyes PERRLA, no icterus, fundoscopic exam per opthamologist Skin no lesions noted Neck supple, no adenopathy, no thyroid enlargement, no tenderness Chest clear to percussion and auscultation Heart  no significant murmurs, gallops or rubs noted Abdomen no hepatosplenomegaly masses or tenderness, BS normal.  Rectal inspection normal no fissures, or fistulae noted. Digital exam was not performed at her request. Extremities no acute joint lesions, edema, phlebitis or evidence of cellulitis. Neurologic patient oriented x 3, cranial nerves intact, no focal neurologic deficits noted. Psychological mental status normal and normal affect.  Assessment and plan: Chronic GERD, rule out Barrett's mucosa, rule out esophageal stricture. I've scheduled endoscopy with possible dilatation. She is due for followup colonoscopy which also has been scheduled with standard balance electrolyte preparation, and we will use propofol sedation. I have given her prescription for when necessary sublingual Levsin. I spent a great deal of time to with this patient reviewing her procedures, labs, past history, and concerned. Total time was over one hour. She appears to be up-to-date on her laboratory data, and these also were reviewed.  Encounter Diagnoses  Name Primary?  . LUQ abdominal pain   . GERD (gastroesophageal reflux disease)   . IBS (irritable bowel syndrome)

## 2011-06-05 NOTE — Patient Instructions (Signed)
You have been scheduled for an endoscopy and colonoscopy with propofol. Please follow the written instructions given to you at your visit today. Please pick up your Moviprep at the pharmacy within the next 2-3 days. We have sent the following medications to your pharmacy for you to pick up at your convenience: Levsin. Take 1 tablet by mouth as needed. We have given you brochures on endoscopy and colonoscopy to look over. ---------------------------------------------------------------------------------------------------------------------------- High Fiber Diet A high fiber diet changes your normal diet to include more whole grains, legumes, fruits, and vegetables. Changes in the diet involve replacing refined carbohydrates with unrefined foods. The calorie level of the diet is essentially unchanged. The Dietary Reference Intake (recommended amount) for adult males is 38 g per day. For adult females, it is 25 g per day. Pregnant and lactating women should consume 28 g of fiber per day. Fiber is the intact part of a plant that is not broken down during digestion. Functional fiber is fiber that has been isolated from the plant to provide a beneficial effect in the body. PURPOSE  Increase stool bulk.   Ease and regulate bowel movements.   Lower cholesterol.  INDICATIONS THAT YOU NEED MORE FIBER  Constipation and hemorrhoids.   Uncomplicated diverticulosis (intestine condition) and irritable bowel syndrome.   Weight management.   As a protective measure against hardening of the arteries (atherosclerosis), diabetes, and cancer.  NOTE OF CAUTION If you have a digestive or bowel problem, ask your caregiver for advice before adding high fiber foods to your diet. Some of the following medical problems are such that a high fiber diet should not be used without consulting your caregiver:  Acute diverticulitis (intestine infection).   Partial small bowel obstructions.   Complicated diverticular  disease involving bleeding, rupture (perforation), or abscess (boil, furuncle).   Presence of autonomic neuropathy (nerve damage) or gastric paresis (stomach cannot empty itself).  GUIDELINES FOR INCREASING FIBER  Start adding fiber to the diet slowly. A gradual increase of about 5 more grams (2 slices of whole-wheat bread, 2 servings of most fruits or vegetables, or 1 bowl of high fiber cereal) per day is best. Too rapid an increase in fiber may result in constipation, flatulence, and bloating.   Drink enough water and fluids to keep your urine clear or pale yellow. Water, juice, or caffeine-free drinks are recommended. Not drinking enough fluid may cause constipation.   Eat a variety of high fiber foods rather than one type of fiber.   Try to increase your intake of fiber through using high fiber foods rather than fiber pills or supplements that contain small amounts of fiber.   The goal is to change the types of food eaten. Do not supplement your present diet with high fiber foods, but replace foods in your present diet.  INCLUDE A VARIETY OF FIBER SOURCES  Replace refined and processed grains with whole grains, canned fruits with fresh fruits, and incorporate other fiber sources. White rice, white breads, and most bakery goods contain little or no fiber.   Brown whole-grain rice, buckwheat oats, and many fruits and vegetables are all good sources of fiber. These include: broccoli, Brussels sprouts, cabbage, cauliflower, beets, sweet potatoes, white potatoes (skin on), carrots, tomatoes, eggplant, squash, berries, fresh fruits, and dried fruits.   Cereals appear to be the richest source of fiber. Cereal fiber is found in whole grains and bran. Bran is the fiber-rich outer coat of cereal grain, which is largely removed in refining. In whole-grain cereals,  the bran remains. In breakfast cereals, the largest amount of fiber is found in those with "bran" in their names. The fiber content is  sometimes indicated on the label.   You may need to include additional fruits and vegetables each day.   In baking, for 1 cup white flour, you may use the following substitutions:   1 cup whole-wheat flour minus 2 tbs.    cup white flour plus  cup whole-wheat flour.  Document Released: 08/03/2005 Document Revised: 04/15/2011 Document Reviewed: 06/11/2009 [facility] Patient Information 2012 [place], [place].

## 2011-06-11 ENCOUNTER — Telehealth: Payer: Self-pay | Admitting: Cardiovascular Disease

## 2011-06-11 NOTE — Telephone Encounter (Signed)
Pt is going to have a colonoscopy. They are having her do a moviprep? she is concerned about this. Please call her back

## 2011-06-11 NOTE — Telephone Encounter (Signed)
This will be fine for her to use.

## 2011-06-11 NOTE — Telephone Encounter (Signed)
This is new prep.  Several things listed including heart problems and irregular heart beat.  Is this ok for her to use.  Procedure date is 11/6 and has picked up from pharmacy.  Please advise

## 2011-06-15 NOTE — Telephone Encounter (Signed)
msg given to pt, will continue with prep.

## 2011-06-19 ENCOUNTER — Telehealth: Payer: Self-pay | Admitting: Gastroenterology

## 2011-06-19 ENCOUNTER — Telehealth: Payer: Self-pay | Admitting: Cardiovascular Disease

## 2011-06-19 NOTE — Telephone Encounter (Signed)
11/2--pt calling with ? About colonoscopy--questions answered--nt

## 2011-06-19 NOTE — Telephone Encounter (Signed)
This is the site for sedation we can have with the nurse and nurses in attendance. We monitor her heart and vital signs closely.

## 2011-06-19 NOTE — Telephone Encounter (Signed)
Reassured pt that she will be monitored by nurses and a CRNA as well as Dr Jarold Motto during her procedure. Nurses will also monitor her HR and VS  Post procedure. Pt stated she felt much better.

## 2011-06-19 NOTE — Telephone Encounter (Signed)
Pt is anxious about her upcoming COLON with Propofol and she just wants to make sure we are aware she will sometimes get an irregular heart beat. She has been seeing Dr Elease Hashimoto about this and she never has any warning when it will happen; her blood pressure goes up and she feels the palpitations. Dr Elease Hashimoto has ordered Propanolol PRN. She has spoken with Dr Harvie Bridge RN this am and she reassured her we will monitor her heart and BP. She also suggested she take a Propanolol that am. Dr Jarold Motto, anything else I can tell the pt to reassure her? Thanks.

## 2011-06-19 NOTE — Telephone Encounter (Signed)
New message:  Pt having colonscopy next week and talked to [name] last week.  She wants to ask about the sedation they will be using.  Aware that [name] is off today. But would like to speak to someone today.

## 2011-06-24 ENCOUNTER — Ambulatory Visit (AMBULATORY_SURGERY_CENTER): Payer: Medicare Other | Admitting: Gastroenterology

## 2011-06-24 ENCOUNTER — Encounter: Payer: Self-pay | Admitting: Gastroenterology

## 2011-06-24 DIAGNOSIS — D126 Benign neoplasm of colon, unspecified: Secondary | ICD-10-CM

## 2011-06-24 DIAGNOSIS — Z860100 Personal history of colon polyps, unspecified: Secondary | ICD-10-CM

## 2011-06-24 DIAGNOSIS — Z1211 Encounter for screening for malignant neoplasm of colon: Secondary | ICD-10-CM

## 2011-06-24 DIAGNOSIS — R059 Cough, unspecified: Secondary | ICD-10-CM

## 2011-06-24 DIAGNOSIS — D128 Benign neoplasm of rectum: Secondary | ICD-10-CM

## 2011-06-24 DIAGNOSIS — D129 Benign neoplasm of anus and anal canal: Secondary | ICD-10-CM

## 2011-06-24 DIAGNOSIS — K219 Gastro-esophageal reflux disease without esophagitis: Secondary | ICD-10-CM

## 2011-06-24 DIAGNOSIS — K5730 Diverticulosis of large intestine without perforation or abscess without bleeding: Secondary | ICD-10-CM

## 2011-06-24 DIAGNOSIS — R1012 Left upper quadrant pain: Secondary | ICD-10-CM

## 2011-06-24 DIAGNOSIS — K589 Irritable bowel syndrome without diarrhea: Secondary | ICD-10-CM

## 2011-06-24 DIAGNOSIS — R05 Cough: Secondary | ICD-10-CM

## 2011-06-24 MED ORDER — OMEPRAZOLE 20 MG PO CPDR: 20.0000 mg | DELAYED_RELEASE_CAPSULE | Freq: Two times a day (BID) | ORAL | Status: DC

## 2011-06-24 MED ORDER — SODIUM CHLORIDE 0.9 % IV SOLN: 500.0000 mL | INTRAVENOUS | Status: DC

## 2011-06-24 NOTE — Patient Instructions (Signed)
Please review discharge instructions (blue and green sheets)  Await pathology  Resume your normal medications 

## 2011-06-25 ENCOUNTER — Telehealth: Payer: Self-pay | Admitting: *Deleted

## 2011-06-25 MED ORDER — DICYCLOMINE HCL 10 MG PO CAPS: 10.0000 mg | ORAL_CAPSULE | Freq: Four times a day (QID) | ORAL | Status: DC

## 2011-06-25 NOTE — Telephone Encounter (Signed)
Addended by: Florene Glen on: 06/25/2011 10:15 AM   Modules accepted: Orders

## 2011-06-25 NOTE — Telephone Encounter (Signed)
Spoke with pt and informed her we ordered Bentyl, but we don't know if it's cheaper or not. Pt stated understanding.

## 2011-06-25 NOTE — Telephone Encounter (Signed)
Follow up Call- Patient questions:  Do you have a fever, pain , or abdominal swelling? no Pain Score  0 *  Have you tolerated food without any problems? yes  Have you been able to return to your normal activities? yes  Do you have any questions about your discharge instructions: Diet   no Medications  yes Follow up visit  no  Do you have questions or concerns about your Care? no  Actions: * If pain score is 4 or above: Physician/ provider Notified : Sheryn Bison, MD.  Dr. Jarold Motto, Pt would like to know if there is another medication that she can take for her cramping in Left abdomen besides Levsin. Pt states that her copay on this medication is $28 and she was wondering if there was a generic brand that could be prescribed or another medication all together that would be better covered by her insurance. Thanks, [employer]

## 2011-06-25 NOTE — Telephone Encounter (Signed)
Generic bentyl 10mg  q 6h prn

## 2011-06-29 ENCOUNTER — Encounter: Payer: Self-pay | Admitting: Gastroenterology

## 2011-06-29 ENCOUNTER — Ambulatory Visit
Admission: RE | Admit: 2011-06-29 | Discharge: 2011-06-29 | Disposition: A | Discharge status: Home / Self Care | Payer: Medicare Other | Source: Ambulatory Visit | Attending: Gynecology | Admitting: Gynecology

## 2011-06-29 DIAGNOSIS — Z1231 Encounter for screening mammogram for malignant neoplasm of breast: Secondary | ICD-10-CM

## 2011-07-13 ENCOUNTER — Other Ambulatory Visit: Payer: Self-pay | Admitting: Internal Medicine

## 2011-07-16 ENCOUNTER — Telehealth: Payer: Self-pay | Admitting: *Deleted

## 2011-07-16 MED ORDER — DIAZEPAM 5 MG PO TABS: 2.5000 mg | ORAL_TABLET | Freq: Two times a day (BID) | ORAL | Status: DC

## 2011-07-16 NOTE — Telephone Encounter (Signed)
Requested Medications     diazepam (VALIUM) 5 MG tablet [Pharmacy Med Name: DIAZEPAM 5 MG TABLET]   take 1/2 to 1 tablet by mouth twice a day if needed   Disp: 30 tablet R: 2 Start: 07/13/2011  Class: Normal   Originally ordered on: 01/01/2011  Last refill: 06/03/2011

## 2011-07-16 NOTE — Telephone Encounter (Signed)
OK to fill this prescription with additional refills x2 Thank you!  

## 2011-10-21 ENCOUNTER — Telehealth: Payer: Self-pay | Admitting: Cardiovascular Disease

## 2011-10-21 NOTE — Telephone Encounter (Signed)
New Msg: Pt calling wanting to speak with nurse regarding pt wanting to get lab work prior to pt appt. Please return pt call to discuss further.

## 2011-10-21 NOTE — Telephone Encounter (Signed)
App made, told to come fasting. Pt agreed with plan.

## 2011-11-23 ENCOUNTER — Ambulatory Visit (INDEPENDENT_AMBULATORY_CARE_PROVIDER_SITE_OTHER): Payer: Medicare Other | Admitting: Cardiovascular Disease

## 2011-11-23 ENCOUNTER — Encounter: Payer: Self-pay | Admitting: Cardiovascular Disease

## 2011-11-23 VITALS — BP 147/81 | HR 102 | Ht 65.0 in | Wt 182.1 lb

## 2011-11-23 DIAGNOSIS — I10 Essential (primary) hypertension: Secondary | ICD-10-CM

## 2011-11-23 DIAGNOSIS — R002 Palpitations: Secondary | ICD-10-CM

## 2011-11-23 DIAGNOSIS — E785 Hyperlipidemia, unspecified: Secondary | ICD-10-CM

## 2011-11-23 LAB — LIPID PANEL
Cholesterol: 275 mg/dL — ABNORMAL HIGH (ref 0–200)
HDL: 98.2 mg/dL (ref >39.00)
VLDL: 16 mg/dL (ref 0.0–40.0)

## 2011-11-23 LAB — BASIC METABOLIC PANEL
Chloride: 103 mEq/L (ref 96–112)
GFR: 70.73 mL/min (ref >60.00)
Glucose, Bld: 91 mg/dL (ref 70–99)
Potassium: 4.5 mEq/L (ref 3.5–5.1)
Sodium: 141 mEq/L (ref 135–145)

## 2011-11-23 LAB — HEPATIC FUNCTION PANEL
Albumin: 4.8 g/dL (ref 3.5–5.2)
Bilirubin, Direct: 0 mg/dL (ref 0.0–0.3)
Total Protein: 7.9 g/dL (ref 6.0–8.3)

## 2011-11-23 MED ORDER — PROPRANOLOL HCL 10 MG PO TABS: 10.0000 mg | ORAL_TABLET | Freq: Four times a day (QID) | ORAL | Status: DC | PRN

## 2011-11-23 NOTE — Assessment & Plan Note (Addendum)
Laura Vasquez seems to have some palpitations that are relieved with propranolol. I've reassured her that this is a suitable treatment for these benign palpitations. She would like to try taking propranolol twice a day and I've refilled her prescription for 60 tablets with when necessary refills.   She has evidence of sinus arrhythmia and also may have episodes of premature ventricular contractions.  I've asked her to continue to get out and get some regular exercise. She is also to work on a good diet and weight loss program. I'll see her again in 6 months.

## 2011-11-23 NOTE — Progress Notes (Signed)
Laura Vasquez Date of Birth  February 02, 1946 [facility]     [place] [facility]  [address]    [address]   [address] [place], [place]  [address]    [place], [place]  [address] [phone]  Fax  [phone]  [phone]  Fax [phone]  Problem list: 1. Hypercholesterolemia 2. Fibromyalgia 3. Anxiety 4. Mild mitral regurgitation 5. Palpitations   History of Present Illness:  Laura Vasquez is a 66 year old female who I follow mostly for palpitations. She also has a history of hyperlipidemia, fibromyalgia, mild mitral regurgitation. She measures her blood pressure and heart rate on right basis. She's quite worried by the fact that these readings are somewhat variable. I've reassured her that these are normal readings.  Current Outpatient Prescriptions on File Prior to Visit  Medication Sig Dispense Refill  . aspirin 81 MG tablet Take 81 mg by mouth daily.        Marland Kitchen CALCIUM PO Take 500 mg by mouth daily.       . cholecalciferol (VITAMIN D) 1000 UNITS tablet Take 2,000 Units by mouth daily.        . cyanocobalamin 1000 MCG tablet Take 500 mcg by mouth daily.        . diazepam (VALIUM) 5 MG tablet Take 0.5-1 tablets (2.5-5 mg total) by mouth 2 (two) times daily. Taking 1 Daily as needed  30 tablet  2  . dicyclomine (BENTYL) 10 MG capsule Take 1 capsule (10 mg total) by mouth 4 (four) times daily.  120 capsule  0  . fluticasone (FLONASE) 50 MCG/ACT nasal spray Place 2 sprays into the nose. Taking as Needed      . loratadine (CLARITIN) 10 MG tablet Take 10 mg by mouth. Taking as Needed       . MAGNESIUM PO Take 250 mg by mouth.       . Omega-3 Fatty Acids (FISH OIL PO) Take 1 each by mouth daily.        Marland Kitchen omeprazole (PRILOSEC) 20 MG capsule Take 1 capsule (20 mg total) by mouth 2 (two) times daily.  60 capsule  5  . propranolol (INDERAL) 10 MG tablet Take 1 tablet (10 mg total) by mouth 4 (four) times daily as needed.  30 tablet  12    Allergies  Allergen Reactions  .  Augmentin   . Codeine   . QIH:[id number]+[id number]+[id number] Acid+Aspartame   . Naproxen   . Sulfonamide Derivatives     Past Medical History  Diagnosis Date  . Anxiety   . Hypertension   . Allergy   . GERD (gastroesophageal reflux disease)   . Hyperlipidemia   . Fibromyalgia   . Mitral regurgitation   . Palpitations   . IBS (irritable bowel syndrome)   . Vitamin B12 deficiency   . Hyperplastic colon polyp   . Peptic stricture of esophagus   . Erosive esophagitis   . Osteoarthritis   . Fatty liver   . Arrhythmia     Past Surgical History  Procedure Date  . Cholecystectomy   . Tubal ligation   . Appendectomy   . Abdominal hysterectomy     History  Smoking status  . Former Smoker  . Quit date: 08/18/1991  Smokeless tobacco  . Never Used    History  Alcohol Use  . Yes    1-2 glasses     Family History  Problem Relation Age of Onset  . Hypertension Mother   . Ovarian cancer Mother   .  Hypertension Father   . Ovarian cancer Sister   . Fibromyalgia Sister     x 2  . Colon cancer Neg Hx   . Diabetes Sister     Reviw of Systems:  Reviewed in the HPI.  All other systems are negative.  Physical Exam: Blood pressure 147/81, pulse 102, height 5\' 5"  (1.651 m), weight 182 lb 1.9 oz (82.609 kg). General: Well developed, well nourished, in no acute distress.  Head: Normocephalic, atraumatic, sclera non-icteric, mucus membranes are moist,   Neck: Supple. Carotids are 2 + without bruits. No JVD  Lungs: Clear bilaterally to auscultation.  Heart: regular rate.  normal  S1 S2. No murmurs, gallops or rubs. Has occasional premature beats.  Abdomen: Soft, non-tender, non-distended with normal bowel sounds. No hepatomegaly. No rebound/guarding. No masses.  Msk:  Strength and tone are normal  Extremities: No clubbing or cyanosis. No edema.  Distal pedal pulses are 2+ and equal bilaterally.  Neuro: Alert and oriented X 3. Moves all extremities  spontaneously.  Psych:  Responds to questions appropriately with a normal affect.  ECG: Sinus rhythm with marked sinus arrhythmia. She has nonspecific ST and T wave changes. The EKG is basically unchanged from her previous tracings.  Assessment / Plan:

## 2011-11-23 NOTE — Patient Instructions (Signed)
Your physician recommends that you return for lab work in: today  (lipid, liver, bmet) and in 6 months (fasting)  Your physician wants you to follow-up in: 6 months with fasting labs.  You will receive a reminder letter in the mail two months in advance. If you don't receive a letter, please call our office to schedule the follow-up appointment.

## 2011-11-26 ENCOUNTER — Encounter: Payer: Self-pay | Admitting: *Deleted

## 2011-11-26 ENCOUNTER — Telehealth: Payer: Self-pay | Admitting: Cardiovascular Disease

## 2011-11-26 NOTE — Telephone Encounter (Signed)
Please return call to patient at [phone] regarding 4/8 labs.

## 2011-11-26 NOTE — Progress Notes (Signed)
Addended by: Reine Just on: 11/26/2011 02:03 PM   Modules accepted: Orders

## 2011-11-26 NOTE — Telephone Encounter (Signed)
I spoke with Laura Vasquez about recent lab results and Dr. Harvie Bridge recommendations. She does not want to start Atorvastatin at this time. She would like to continue to work on diet and weight loss for the next 6 months.  "Then revisit this medication situation". She was not happy to know her past lab results were in the computer and wanted to know who did those cholesterol labs 5 years ago.  Information given to Laura Vasquez. Copy of cholesterol results mailed to Laura Vasquez. Mylo Red RN

## 2011-12-27 ENCOUNTER — Encounter (HOSPITAL_COMMUNITY): Payer: Self-pay | Admitting: Emergency Medicine

## 2011-12-27 ENCOUNTER — Emergency Department (HOSPITAL_COMMUNITY)
Admission: EM | Admit: 2011-12-27 | Discharge: 2011-12-27 | Disposition: A | Discharge status: Home / Self Care | Payer: Medicare Other | Attending: Emergency Medicine | Admitting: Emergency Medicine

## 2011-12-27 DIAGNOSIS — F411 Generalized anxiety disorder: Secondary | ICD-10-CM | POA: Insufficient documentation

## 2011-12-27 DIAGNOSIS — IMO0001 Reserved for inherently not codable concepts without codable children: Secondary | ICD-10-CM | POA: Insufficient documentation

## 2011-12-27 DIAGNOSIS — E785 Hyperlipidemia, unspecified: Secondary | ICD-10-CM | POA: Insufficient documentation

## 2011-12-27 DIAGNOSIS — I059 Rheumatic mitral valve disease, unspecified: Secondary | ICD-10-CM | POA: Insufficient documentation

## 2011-12-27 DIAGNOSIS — R42 Dizziness and giddiness: Secondary | ICD-10-CM

## 2011-12-27 DIAGNOSIS — I10 Essential (primary) hypertension: Secondary | ICD-10-CM | POA: Insufficient documentation

## 2011-12-27 LAB — CBC
MCH: 30.2 pg (ref 26.0–34.0)
MCV: 90.9 fL (ref 78.0–100.0)
Platelets: 200 10*3/uL (ref 150–400)
RBC: 4.74 MIL/uL (ref 3.87–5.11)

## 2011-12-27 LAB — BASIC METABOLIC PANEL
BUN: 19 mg/dL (ref 6–23)
Calcium: 9.7 mg/dL (ref 8.4–10.5)
GFR calc non Af Amer: 73 mL/min — ABNORMAL LOW (ref >90)
Glucose, Bld: 106 mg/dL — ABNORMAL HIGH (ref 70–99)
Sodium: 143 mEq/L (ref 135–145)

## 2011-12-27 LAB — DIFFERENTIAL
Eosinophils Absolute: 0.1 10*3/uL (ref 0.0–0.7)
Eosinophils Relative: 2 % (ref 0–5)
Lymphs Abs: 2.2 10*3/uL (ref 0.7–4.0)
Monocytes Relative: 7 % (ref 3–12)

## 2011-12-27 MED ORDER — LORAZEPAM 1 MG PO TABS
1.0000 mg | ORAL_TABLET | Freq: Once | ORAL | Status: AC
  Administered 2011-12-27: 1 mg via ORAL
  Filled 2011-12-27: qty 1

## 2011-12-27 MED ORDER — MECLIZINE HCL 25 MG PO TABS: 25.0000 mg | ORAL_TABLET | Freq: Four times a day (QID) | ORAL | Status: AC | PRN

## 2011-12-27 MED ORDER — MECLIZINE HCL 25 MG PO TABS: 25.0000 mg | ORAL_TABLET | Freq: Once | ORAL | Status: DC

## 2011-12-27 MED ORDER — LORAZEPAM 1 MG PO TABS: 1.0000 mg | ORAL_TABLET | Freq: Three times a day (TID) | ORAL | Status: AC | PRN

## 2011-12-27 NOTE — ED Notes (Signed)
Pt states unable to sit in wheelchair, prefer to lay down. Pt taken to ecg room laying  on stretcher chair. Pt upset because visitor can't come back til she goes to back.  I tried to explain policy to pt abt why we ask visitors to remain in waiting area. Pt disagreed and request that pt comes in ecg room with her. Pt upset/visitor brought back to ecg room.

## 2011-12-27 NOTE — ED Notes (Signed)
PA at bedside.

## 2011-12-27 NOTE — Discharge Instructions (Signed)
As we discussed today, your physical examination did not indicate there is a serious cause of your dizziness. You were given ativan with good improvement in your symptoms. You are being given prescriptions for both meclizine and ativan today. You can choose to take either one for your symptoms. If you are feeling anxious in addition to your dizziness, the ativan is a better choice. Do not take valium while you are taking the ativan.  Your blood count and electrolyte panel today were normal. Your EKG did not show any changes from the last tracing in early April.  If your symptoms continue, you should see your ENT doctor for further management. If you have worsening of symptoms and cannot see your ENT doctor or regular doctor, you can return to the ER for re-evaluation.     Benign Positional Vertigo Vertigo means you feel like you or your surroundings are moving when they are not. Benign positional vertigo is the most common form of vertigo. Benign means that the cause of your condition is not serious. Benign positional vertigo is more common in older adults. CAUSES  Benign positional vertigo is the result of an upset in the labyrinth system. This is an area in the middle ear that helps control your balance. This may be caused by a viral infection, head injury, or repetitive motion. However, often no specific cause is found. SYMPTOMS  Symptoms of benign positional vertigo occur when you move your head or eyes in different directions. Some of the symptoms may include:  Loss of balance and falls.   Vomiting.   Blurred vision.   Dizziness.   Nausea.   Involuntary eye movements (nystagmus).  DIAGNOSIS  Benign positional vertigo is usually diagnosed by physical exam. If the specific cause of your benign positional vertigo is unknown, your caregiver may perform imaging tests, such as magnetic resonance imaging (MRI) or computed tomography (CT). TREATMENT  Your caregiver may recommend movements  or procedures to correct the benign positional vertigo. Medicines such as meclizine, benzodiazepines, and medicines for nausea may be used to treat your symptoms. In rare cases, if your symptoms are caused by certain conditions that affect the inner ear, you may need surgery. HOME CARE INSTRUCTIONS   Follow your caregiver's instructions.   Move slowly. Do not make sudden body or head movements.   Avoid driving.   Avoid operating heavy machinery.   Avoid performing any tasks that would be dangerous to you or others during a vertigo episode.   Drink enough fluids to keep your urine clear or pale yellow.  SEEK IMMEDIATE MEDICAL CARE IF:   You develop problems with walking, weakness, numbness, or using your arms, hands, or legs.   You have difficulty speaking.   You develop severe headaches.   Your nausea or vomiting continues or gets worse.   You develop visual changes.   Your family or friends notice any behavioral changes.   Your condition gets worse.   You have a fever.   You develop a stiff neck or sensitivity to light.  MAKE SURE YOU:   Understand these instructions.   Will watch your condition.   Will get help right away if you are not doing well or get worse.  Document Released: 05/11/2006 Document Revised: 07/23/2011 Document Reviewed: 04/23/2011 [facility] Patient Information 2012 [place], [place].

## 2011-12-27 NOTE — ED Notes (Signed)
PT. REPORTS DIZZINESS , UNSTEADY GAIT , "RINGING /ECHOIMG " OF EARS  ONSET YESTERDAY , SEEN BY DR. C. nEWMAN 1 WEEK AGO FOR THE DIZZINESS / TINNITUS WITH NO DEFINITE DIAGNOSIS.

## 2011-12-27 NOTE — ED Provider Notes (Signed)
Medical screening examination/treatment/procedure(s) were performed by non-physician practitioner and as supervising physician I was immediately available for consultation/collaboration.   Lyanne Co, MD 12/27/11 2215

## 2011-12-27 NOTE — ED Notes (Signed)
Pt states she awoke feeling as if she was falling out of bed.  She stood up to go to the bathroom and she couldn't walk because she was so dizzy.  Recently went to see her ENT Ezzard Standing) b/c she had been having some balance problems and feeling like things were "echoing".  Hx of svt and "irregular" heartbeat.  Pt placed on 12-lead and is in NS.  While RN at bedside pt experienced another episode of dizziness with no change in monitor rhythm.  Pt denies sinus pain/drainage.  Denies chest pain.

## 2011-12-27 NOTE — ED Provider Notes (Signed)
History     CSN: [id number]  Arrival date & time 12/27/11  0340   First MD Initiated Contact with Patient 12/27/11 0606      Chief Complaint  Patient presents with  . Dizziness    (Consider location/radiation/quality/duration/timing/severity/associated sxs/prior treatment) The history is provided by the patient.  66 y/o F with PMH anxiety, HTN, and a remote hx of a single vertigo episode presents to ED with c/c of dizziness described as "room spinning" that began at 3:30 AM today when she rolled over in bed. There was associated intermittent tinnitus and nausea as well as trouble ambulating due to fear of falling. There was no HA, visual change, CP, trouble speaking, or numbness/weakness to the extremities. Symptoms worse with rapid movements, improved with lying still. Did not attempt to take any medications for the symptoms. Pt was seen by ENT Dr Ezzard Standing almost 2 weeks ago for bilateral ear pressure, echoing, and sensitivity to low-pitched noises, but denies any dizziness at that time.   Past Medical History  Diagnosis Date  . Anxiety   . Hypertension   . Allergy   . GERD (gastroesophageal reflux disease)   . Hyperlipidemia   . Fibromyalgia   . Mitral regurgitation   . Palpitations   . IBS (irritable bowel syndrome)   . Vitamin B12 deficiency   . Hyperplastic colon polyp   . Peptic stricture of esophagus   . Erosive esophagitis   . Osteoarthritis   . Fatty liver   . Arrhythmia     Past Surgical History  Procedure Date  . Cholecystectomy   . Tubal ligation   . Appendectomy   . Abdominal hysterectomy     Family History  Problem Relation Age of Onset  . Hypertension Mother   . Ovarian cancer Mother   . Hypertension Father   . Ovarian cancer Sister   . Fibromyalgia Sister     x 2  . Colon cancer Neg Hx   . Diabetes Sister     History  Substance Use Topics  . Smoking status: Former Smoker    Quit date: 08/18/1991  . Smokeless tobacco: Never Used  . Alcohol  Use: Yes     1-2 glasses      Review of Systems 10 systems reviewed and are negative for acute change except as noted in the HPI.  Allergies  Amoxicillin-pot clavulanate; Amoxicillin-pot clavulanate; Codeine; Naproxen; and Sulfonamide derivatives  Home Medications   Current Outpatient Rx  Name Route Sig Dispense Refill  . ASPIRIN EC 81 MG PO TBEC Oral Take 81 mg by mouth daily.    Marland Kitchen CALCIUM CARBONATE 1250 MG PO TABS Oral Take 1 tablet by mouth daily.    Marland Kitchen CALCIUM CARBONATE ANTACID 500 MG PO CHEW Oral Chew 1 tablet by mouth daily.    Marland Kitchen VITAMIN D 1000 UNITS PO TABS Oral Take 2,000 Units by mouth daily.      Marland Kitchen DIAZEPAM 5 MG PO TABS Oral Take 2.5-5 mg by mouth 2 (two) times daily as needed. For anxiety.    Marland Kitchen MAGNESIUM PO Oral Take 250 mg by mouth.     Marland Kitchen FISH OIL PO Oral Take 1 each by mouth daily.      Marland Kitchen OMEPRAZOLE 20 MG PO CPDR Oral Take 20 mg by mouth 2 (two) times daily.    Marland Kitchen PROPRANOLOL HCL 10 MG PO TABS Oral Take 10 mg by mouth 2 (two) times daily.       BP 164/84  Pulse 94  Temp(Src) 97.7 F (36.5 C) (Oral)  Resp 16  SpO2 100%  Physical Exam  Nursing note reviewed. Constitutional: She is oriented to person, place, and time.       VS reviewed, sig for HTN  HENT:  Head: Normocephalic and atraumatic.  Right Ear: Hearing, tympanic membrane, external ear and ear canal normal.  Left Ear: Hearing, tympanic membrane and external ear normal.  Nose: Nose normal. Right sinus exhibits no maxillary sinus tenderness and no frontal sinus tenderness. Left sinus exhibits no maxillary sinus tenderness and no frontal sinus tenderness.  Mouth/Throat: Uvula is midline, oropharynx is clear and moist and mucous membranes are normal.  Eyes: Conjunctivae and EOM are normal. Pupils are equal, round, and reactive to light.       No nystagmus. Visual fields full bilaterally  Neck: Neck supple.  Cardiovascular: Normal rate, regular rhythm and normal heart sounds.   No murmur heard.       Bilateral radial and DP pulses 2+  Pulmonary/Chest: Effort normal and breath sounds normal. No respiratory distress. She has no wheezes. She exhibits no tenderness.  Abdominal: Soft. Bowel sounds are normal. She exhibits no distension. There is no tenderness.  Musculoskeletal: She exhibits no edema and no tenderness.  Lymphadenopathy:    She has no cervical adenopathy.  Neurological: She is alert and oriented to person, place, and time. GCS eye subscore is 4. GCS verbal subscore is 5. GCS motor subscore is 6.       CN 3-12 tested and are intact. No facial droop. Speech clear. Bilateral grip 5/5. Sensation is intact to light touch and symmetric in all extremities. MAEW with no strength deficit as tested in major muscle groups. Slow, steady gait without ataxia. Negative rhomberg. F-N intact bilaterally.   Skin: Skin is warm and dry. No rash noted.  Psychiatric:       Becomes visibly anxious during certain parts of examination/conversation    ED Course  Procedures (including critical care time)  Labs Reviewed  BASIC METABOLIC PANEL - Abnormal; Notable for the following:    Glucose, Bld 106 (*)    GFR calc non Af Amer 73 (*)    GFR calc Af Amer 85 (*)    All other components within normal limits  CBC  DIFFERENTIAL   No results found.   Date: 12/27/2011  Rate: 63  Rhythm: sinus  QRS Axis: normal  Intervals: normal  ST/T Wave abnormalities: TWI anterior leads V1-V3  Conduction Disutrbances:none  Narrative Interpretation: prior ECG dated 11/23/2011, TWI present on prior tracing  Old EKG Reviewed: unchanged    1. Vertigo       MDM  Dizziness x several hours. HINTS exam benign. Steady gait. Most c/w BPPV, good sx improvement with ativan. Pt d/c home with both meclizine and ativan, discussed benefits of each medication and instructed pt to choose which to take. She will f.u with ENT if symptoms persist.        Shaaron Adler, PA-C 12/27/11 [phone]

## 2012-01-07 ENCOUNTER — Telehealth: Payer: Self-pay | Admitting: Internal Medicine

## 2012-01-07 NOTE — Telephone Encounter (Signed)
PT IS COMING FRI MAY 24.

## 2012-01-07 NOTE — Telephone Encounter (Signed)
LOV 01-01-2011.  HAVING VERTIGO.  WAS TOLD TO FU BY HER ENT.  WHEN CAN SHE BE WORKED IN?

## 2012-01-07 NOTE — Telephone Encounter (Signed)
Ok tomorrow Thx 

## 2012-01-08 ENCOUNTER — Encounter: Payer: Self-pay | Admitting: Internal Medicine

## 2012-01-08 ENCOUNTER — Ambulatory Visit (INDEPENDENT_AMBULATORY_CARE_PROVIDER_SITE_OTHER): Payer: Medicare Other | Admitting: Internal Medicine

## 2012-01-08 VITALS — BP 132/82 | HR 76 | Temp 98.1°F | Resp 16 | Wt 180.0 lb

## 2012-01-08 DIAGNOSIS — R002 Palpitations: Secondary | ICD-10-CM

## 2012-01-08 DIAGNOSIS — I10 Essential (primary) hypertension: Secondary | ICD-10-CM

## 2012-01-08 DIAGNOSIS — E538 Deficiency of other specified B group vitamins: Secondary | ICD-10-CM

## 2012-01-08 DIAGNOSIS — R42 Dizziness and giddiness: Secondary | ICD-10-CM

## 2012-01-08 MED ORDER — FLUTICASONE PROPIONATE 50 MCG/ACT NA SUSP: 2.0000 | Freq: Every day | NASAL | Status: DC

## 2012-01-08 NOTE — Assessment & Plan Note (Signed)
5/13 BPV Benign Positional Vertigo symptoms  Start Laura Vasquez - Daroff exercise several times a day as dirrected.

## 2012-01-08 NOTE — Assessment & Plan Note (Signed)
Continue with current prescription therapy as reflected on the Med list.  

## 2012-01-08 NOTE — Patient Instructions (Signed)
Benign Positional Vertigo symptoms  Start Laura Vasquez - Daroff exercise several times a day as dirrected.  BP Readings from Last 3 Encounters:  01/08/12 132/82  12/27/11 145/81  11/23/11 147/81

## 2012-01-08 NOTE — Assessment & Plan Note (Signed)
Propranolol bid

## 2012-01-08 NOTE — Progress Notes (Signed)
  Subjective:    Patient ID: Laura Vasquez, female    DOB: 04/17/1946, 66 y.o.   MRN: [id number]  HPI  C/o vertigo episode on 5/12 - went to ER. She saw Dr Ezzard Standing, ENT. F/u anxiety, palpitations, HTN  BP Readings from Last 3 Encounters:  01/08/12 132/82  12/27/11 145/81  11/23/11 147/81   Wt Readings from Last 3 Encounters:  01/08/12 180 lb (81.647 kg)  11/23/11 182 lb 1.9 oz (82.609 kg)  06/24/11 185 lb (83.915 kg)       Review of Systems  Constitutional: Negative for chills, activity change, appetite change, fatigue and unexpected weight change.  HENT: Negative for congestion, mouth sores and sinus pressure.   Eyes: Negative for visual disturbance.  Respiratory: Negative for cough and chest tightness.   Gastrointestinal: Negative for nausea and abdominal pain.  Genitourinary: Negative for frequency, difficulty urinating and vaginal pain.  Musculoskeletal: Negative for back pain and gait problem.  Skin: Negative for pallor and rash.  Neurological: Positive for dizziness (very mild now). Negative for tremors, weakness, numbness and headaches.  Psychiatric/Behavioral: Negative for suicidal ideas, confusion and sleep disturbance. The patient is nervous/anxious.        Objective:   Physical Exam  Constitutional: She appears well-developed. No distress.  HENT:  Head: Normocephalic.  Right Ear: External ear normal.  Left Ear: External ear normal.  Nose: Nose normal.  Mouth/Throat: Oropharynx is clear and moist.  Eyes: Conjunctivae are normal. Pupils are equal, round, and reactive to light. Right eye exhibits no discharge. Left eye exhibits no discharge.  Neck: Normal range of motion. Neck supple. No JVD present. No tracheal deviation present. No thyromegaly present.  Cardiovascular: Normal rate, regular rhythm and normal heart sounds.   Pulmonary/Chest: No stridor. No respiratory distress. She has no wheezes.  Abdominal: Soft. Bowel sounds are normal. She exhibits no  distension and no mass. There is no tenderness. There is no rebound and no guarding.  Musculoskeletal: She exhibits no edema and no tenderness.  Lymphadenopathy:    She has no cervical adenopathy.  Neurological: She displays normal reflexes. No cranial nerve deficit. She exhibits normal muscle tone. Coordination normal.  Skin: No rash noted. No erythema.  Psychiatric: She has a normal mood and affect. Her behavior is normal. Judgment and thought content normal.    Lab Results  Component Value Date   WBC 5.4 12/27/2011   HGB 14.3 12/27/2011   HCT 43.1 12/27/2011   PLT 200 12/27/2011   GLUCOSE 106* 12/27/2011   CHOL 275* 11/23/2011   TRIG 80.0 11/23/2011   HDL 98.20 11/23/2011   LDLDIRECT 164.0 11/23/2011   ALT 24 11/23/2011   AST 27 11/23/2011   NA 143 12/27/2011   K 4.5 12/27/2011   CL 105 12/27/2011   CREATININE 0.81 12/27/2011   BUN 19 12/27/2011   CO2 25 12/27/2011   TSH 3.11 08/07/2010         Assessment & Plan:

## 2012-01-18 ENCOUNTER — Other Ambulatory Visit: Payer: Self-pay | Admitting: Gynecology

## 2012-01-19 ENCOUNTER — Ambulatory Visit: Payer: Medicare Other | Admitting: Internal Medicine

## 2012-01-22 ENCOUNTER — Telehealth: Payer: Self-pay | Admitting: Internal Medicine

## 2012-01-22 ENCOUNTER — Ambulatory Visit (INDEPENDENT_AMBULATORY_CARE_PROVIDER_SITE_OTHER): Payer: Medicare Other | Admitting: Internal Medicine

## 2012-01-22 ENCOUNTER — Encounter: Payer: Self-pay | Admitting: Internal Medicine

## 2012-01-22 VITALS — BP 132/92 | HR 89 | Temp 98.4°F | Ht 64.0 in | Wt 179.1 lb

## 2012-01-22 DIAGNOSIS — F411 Generalized anxiety disorder: Secondary | ICD-10-CM

## 2012-01-22 DIAGNOSIS — R42 Dizziness and giddiness: Secondary | ICD-10-CM

## 2012-01-22 DIAGNOSIS — J019 Acute sinusitis, unspecified: Secondary | ICD-10-CM

## 2012-01-22 DIAGNOSIS — J209 Acute bronchitis, unspecified: Secondary | ICD-10-CM | POA: Insufficient documentation

## 2012-01-22 DIAGNOSIS — I10 Essential (primary) hypertension: Secondary | ICD-10-CM

## 2012-01-22 NOTE — Patient Instructions (Signed)
OK to cont your treatment as you are doing No new medications or treatment today, as you are gettting better as expected

## 2012-01-22 NOTE — Telephone Encounter (Signed)
Caller: Avaleigh/Patient; PCP: Plotnikov, Alex; CB#: [phone]; ; ; Call regarding Cough/Congestion; states was seen in [facility] 01/19/12.  Chest was clear, but has sinus infection and has Rx for ceftin x 10 days.  Mucus is still blood streaked from nose, which has occurred before.  Afebrile.  Overall, feels there is minor improvement.  Sleeping through the night; using her netipot with good clearance.  Voice is hoarse due to coughing.  Feels her sore throat is "terrible."  Per protocol, advised appt within 4 hours; appt sched 01/22/12 1445 with Dr. Jonny Ruiz.

## 2012-01-23 ENCOUNTER — Encounter: Payer: Self-pay | Admitting: Internal Medicine

## 2012-01-23 DIAGNOSIS — J019 Acute sinusitis, unspecified: Secondary | ICD-10-CM | POA: Insufficient documentation

## 2012-01-23 NOTE — Assessment & Plan Note (Signed)
stable overall by hx and exam, , and pt to continue medical treatment as before   

## 2012-01-23 NOTE — Progress Notes (Signed)
Subjective:    Patient ID: Laura Vasquez, female    DOB: 03/11/1946, 66 y.o.   MRN: [id number]  HPI  65yo pt of Dr Posey Rea with hx of anxiety here to f/u recent tx for sinusitis with ceftin after start June 4;  Has some slight blood tinged nasal d/c and blood tinged minimally prod cough and this very much concerns her.  No fever and symptoms improved such as facial pain, pressure, general weakness and malaise, and greenish d/c, and Pt denies chest pain, increased sob or doe, wheezing, orthopnea, PND, increased LE swelling, palpitations, dizziness or syncope.   Pt denies polydipsia, polyuria.  Pt denies new neurological symptoms such as new headache, or facial or extremity weakness or numbness , no vertigo as well.  No diarrhea with the ceftin.  No other worsening complaints.   Pt denies fever, wt loss, night sweats, loss of appetite, or other constitutional symptoms  Denies worsening depressive symptoms, suicidal ideation, or panic, though has ongoing anxiety Past Medical History  Diagnosis Date  . Anxiety   . Hypertension   . Allergy   . GERD (gastroesophageal reflux disease)   . Hyperlipidemia   . Fibromyalgia   . Mitral regurgitation   . Palpitations   . IBS (irritable bowel syndrome)   . Vitamin B12 deficiency   . Hyperplastic colon polyp   . Peptic stricture of esophagus   . Erosive esophagitis   . Osteoarthritis   . Fatty liver   . Arrhythmia    Past Surgical History  Procedure Date  . Cholecystectomy   . Tubal ligation   . Appendectomy   . Abdominal hysterectomy     reports that she quit smoking about 20 years ago. She has never used smokeless tobacco. She reports that she drinks alcohol. She reports that she does not use illicit drugs. family history includes Diabetes in her sister; Fibromyalgia in her sister; Hypertension in her father and mother; and Ovarian cancer in her mother and sister.  There is no history of Colon cancer. Allergies  Allergen Reactions  .  Amoxicillin-Pot Clavulanate   . Amoxicillin-Pot Clavulanate   . Codeine   . Naproxen   . Sulfonamide Derivatives    Current Outpatient Prescriptions on File Prior to Visit  Medication Sig Dispense Refill  . aspirin EC 81 MG tablet Take 81 mg by mouth daily.      . calcium carbonate (TUMS - DOSED IN MG ELEMENTAL CALCIUM) 500 MG chewable tablet Chew 1 tablet by mouth daily.      . cholecalciferol (VITAMIN D) 1000 UNITS tablet Take 2,000 Units by mouth daily.        . diazepam (VALIUM) 5 MG tablet Take 2.5-5 mg by mouth 2 (two) times daily as needed. For anxiety.      . fluticasone (FLONASE) 50 MCG/ACT nasal spray Place 2 sprays into the nose daily.  16 g  6  . MAGNESIUM PO Take 250 mg by mouth.       . Omega-3 Fatty Acids (FISH OIL PO) Take 1 each by mouth daily.        Marland Kitchen omeprazole (PRILOSEC) 20 MG capsule Take 20 mg by mouth 2 (two) times daily.      . propranolol (INDERAL) 10 MG tablet Take 10 mg by mouth 2 (two) times daily.        Review of Systems Constitutional: Negative for diaphoresis and unexpected weight change.  HENT: Negative for drooling and tinnitus.   Eyes: Negative for photophobia and  visual disturbance.  Respiratory: Negative for choking and stridor.   Gastrointestinal: Negative for vomiting and blood in stool.  Genitourinary: Negative for hematuria and decreased urine volume.  Neurological: Negative for tremors and numbness.  Psychiatric/Behavioral: Negative for decreased concentration. The patient is not hyperactive.       Objective:   Physical Exam BP 132/92  Pulse 89  Temp(Src) 98.4 F (36.9 C) (Oral)  Ht 5\' 4"  (1.626 m)  Wt 179 lb 2 oz (81.251 kg)  BMI 30.75 kg/m2  SpO2 93% Physical Exam  VS noted, nontoxic appearing Constitutional: Pt appears well-developed and well-nourished.  HENT: Head: Normocephalic.  Right Ear: External ear normal.  Left Ear: External ear normal.  Bilat tm's mild erythema.  Sinus nontender.  Pharynx mild erythema Eyes:  Conjunctivae and EOM are normal. Pupils are equal, round, and reactive to light.  Neck: Normal range of motion. Neck supple.  Cardiovascular: Normal rate and regular rhythm.   Pulmonary/Chest: Effort normal and breath sounds normal.  Neurological: Pt is alert. No cranial nerve deficit. motor intact Skin: Skin is warm. No erythema. No rash Psychiatric: Pt behavior is normal. Thought content normal.1-2+ nervous     Assessment & Plan:

## 2012-01-23 NOTE — Assessment & Plan Note (Signed)
Improving, no further tx or change of tx needed today;  Over half of visit spent counseling with education and reassurance

## 2012-01-23 NOTE — Assessment & Plan Note (Signed)
stable overall by hx and exam, most recent data reviewed with pt, and pt to continue medical treatment as before BP Readings from Last 3 Encounters:  01/22/12 132/92  01/08/12 132/82  12/27/11 145/81

## 2012-01-23 NOTE — Assessment & Plan Note (Signed)
stable overall by hx and exam, most recent data reviewed with pt, and pt to continue medical treatment as before, declines need for counseling or increaed med tx

## 2012-02-03 ENCOUNTER — Encounter: Payer: Self-pay | Admitting: Internal Medicine

## 2012-02-03 ENCOUNTER — Ambulatory Visit (INDEPENDENT_AMBULATORY_CARE_PROVIDER_SITE_OTHER): Payer: Medicare Other | Admitting: Internal Medicine

## 2012-02-03 ENCOUNTER — Ambulatory Visit (INDEPENDENT_AMBULATORY_CARE_PROVIDER_SITE_OTHER)
Admission: RE | Admit: 2012-02-03 | Discharge: 2012-02-03 | Disposition: A | Discharge status: Home / Self Care | Payer: Medicare Other | Source: Ambulatory Visit | Attending: Internal Medicine | Admitting: Internal Medicine

## 2012-02-03 VITALS — BP 138/80 | HR 82 | Temp 97.7°F | Resp 16 | Wt 176.0 lb

## 2012-02-03 DIAGNOSIS — R05 Cough: Secondary | ICD-10-CM

## 2012-02-03 DIAGNOSIS — R059 Cough, unspecified: Secondary | ICD-10-CM

## 2012-02-03 NOTE — Assessment & Plan Note (Signed)
She is recovering from bronchitis and by all accounts she is feeling better but now she tells me that there are tiny streaks of blood so I will check her CXR to look for mass, pna, edema, etc. She does not want a cough suppressant.

## 2012-02-03 NOTE — Patient Instructions (Signed)

## 2012-02-03 NOTE — Progress Notes (Signed)
Subjective:    Patient ID: Laura Vasquez, female    DOB: 1946/07/20, 66 y.o.   MRN: [id number]  Cough This is a recurrent problem. The current episode started 1 to 4 weeks ago. The problem has been gradually improving. The problem occurs every few hours. The cough is productive of sputum (sputum has a tiny amount of blood streaking in it). Associated symptoms include hemoptysis, nasal congestion, postnasal drip and rhinorrhea. Pertinent negatives include no chest pain, chills, ear congestion, ear pain, fever, headaches, heartburn, myalgias, rash, sore throat, shortness of breath, sweats, weight loss or wheezing. Nothing aggravates the symptoms. Treatments tried: ceftin. The treatment provided significant relief. Her past medical history is significant for bronchitis.      Review of Systems  Constitutional: Negative for fever, chills, weight loss, diaphoresis, activity change, appetite change, fatigue and unexpected weight change.  HENT: Positive for congestion, rhinorrhea and postnasal drip. Negative for hearing loss, ear pain, nosebleeds, sore throat, facial swelling, sneezing, drooling, mouth sores, trouble swallowing, neck pain, neck stiffness, dental problem, voice change, sinus pressure and tinnitus.   Eyes: Negative.   Respiratory: Positive for cough and hemoptysis. Negative for apnea, chest tightness, shortness of breath, wheezing and stridor.   Cardiovascular: Negative for chest pain, palpitations and leg swelling.  Gastrointestinal: Negative.  Negative for heartburn.  Genitourinary: Negative.   Musculoskeletal: Negative.  Negative for myalgias.  Skin: Negative for color change, pallor, rash and wound.  Neurological: Negative.  Negative for headaches.  Hematological: Negative for adenopathy. Does not bruise/bleed easily.  Psychiatric/Behavioral: Negative.        Objective:   Physical Exam  Vitals reviewed. Constitutional: She is oriented to person, place, and time. She appears  well-developed and well-nourished.  Non-toxic appearance. She does not have a sickly appearance. She does not appear ill. No distress.  HENT:  Head: Normocephalic and atraumatic.  Mouth/Throat: Oropharynx is clear and moist. No oropharyngeal exudate.  Eyes: Conjunctivae are normal. Right eye exhibits no discharge. Left eye exhibits no discharge. No scleral icterus.  Neck: Normal range of motion. Neck supple. No JVD present. No tracheal deviation present. No thyromegaly present.  Cardiovascular: Normal rate, regular rhythm, normal heart sounds and intact distal pulses.  Exam reveals no gallop and no friction rub.   No murmur heard. Pulmonary/Chest: Effort normal. No accessory muscle usage. Not tachypneic. No respiratory distress. She has no decreased breath sounds. She has no wheezes. She has no rhonchi. She has rales in the right lower field and the left lower field. She exhibits no tenderness.  Abdominal: Soft. Bowel sounds are normal. She exhibits no distension and no mass. There is no tenderness. There is no rebound and no guarding.  Musculoskeletal: Normal range of motion. She exhibits no edema and no tenderness.  Lymphadenopathy:    She has no cervical adenopathy.  Neurological: She is oriented to person, place, and time.  Skin: Skin is warm and dry. No rash noted. She is not diaphoretic. No erythema. No pallor.  Psychiatric: She has a normal mood and affect. Her behavior is normal. Judgment and thought content normal.      Lab Results  Component Value Date   WBC 5.4 12/27/2011   HGB 14.3 12/27/2011   HCT 43.1 12/27/2011   PLT 200 12/27/2011   GLUCOSE 106* 12/27/2011   CHOL 275* 11/23/2011   TRIG 80.0 11/23/2011   HDL 98.20 11/23/2011   LDLDIRECT 164.0 11/23/2011   ALT 24 11/23/2011   AST 27 11/23/2011   NA 143  12/27/2011   K 4.5 12/27/2011   CL 105 12/27/2011   CREATININE 0.81 12/27/2011   BUN 19 12/27/2011   CO2 25 12/27/2011   TSH 3.11 08/07/2010      Assessment & Plan:

## 2012-03-25 ENCOUNTER — Encounter: Payer: Self-pay | Admitting: Internal Medicine

## 2012-03-25 ENCOUNTER — Other Ambulatory Visit (INDEPENDENT_AMBULATORY_CARE_PROVIDER_SITE_OTHER): Payer: Medicare Other

## 2012-03-25 ENCOUNTER — Ambulatory Visit (INDEPENDENT_AMBULATORY_CARE_PROVIDER_SITE_OTHER): Payer: Medicare Other | Admitting: Internal Medicine

## 2012-03-25 VITALS — BP 132/86 | HR 72 | Temp 97.2°F | Resp 16 | Wt 179.5 lb

## 2012-03-25 DIAGNOSIS — E785 Hyperlipidemia, unspecified: Secondary | ICD-10-CM

## 2012-03-25 DIAGNOSIS — Z86010 Personal history of colonic polyps: Secondary | ICD-10-CM

## 2012-03-25 DIAGNOSIS — D126 Benign neoplasm of colon, unspecified: Secondary | ICD-10-CM

## 2012-03-25 DIAGNOSIS — IMO0001 Reserved for inherently not codable concepts without codable children: Secondary | ICD-10-CM

## 2012-03-25 DIAGNOSIS — I10 Essential (primary) hypertension: Secondary | ICD-10-CM

## 2012-03-25 DIAGNOSIS — E538 Deficiency of other specified B group vitamins: Secondary | ICD-10-CM

## 2012-03-25 DIAGNOSIS — Z0000 Encounter for general adult medical examination without abnormal findings: Secondary | ICD-10-CM

## 2012-03-25 DIAGNOSIS — E669 Obesity, unspecified: Secondary | ICD-10-CM

## 2012-03-25 DIAGNOSIS — R42 Dizziness and giddiness: Secondary | ICD-10-CM

## 2012-03-25 DIAGNOSIS — I519 Heart disease, unspecified: Secondary | ICD-10-CM

## 2012-03-25 LAB — HEPATIC FUNCTION PANEL
Albumin: 4.4 g/dL (ref 3.5–5.2)
Total Protein: 7.6 g/dL (ref 6.0–8.3)

## 2012-03-25 LAB — URINALYSIS
Bilirubin Urine: NEGATIVE
Ketones, ur: NEGATIVE
Leukocytes, UA: NEGATIVE
Urine Glucose: NEGATIVE
pH: 7 (ref 5.0–8.0)

## 2012-03-25 LAB — BASIC METABOLIC PANEL
BUN: 13 mg/dL (ref 6–23)
Chloride: 102 mEq/L (ref 96–112)
Creatinine, Ser: 0.8 mg/dL (ref 0.4–1.2)
Glucose, Bld: 98 mg/dL (ref 70–99)

## 2012-03-25 LAB — LIPID PANEL
Cholesterol: 269 mg/dL — ABNORMAL HIGH (ref 0–200)
HDL: 102.5 mg/dL (ref >39.00)
Triglycerides: 82 mg/dL (ref 0.0–149.0)

## 2012-03-25 LAB — URIC ACID: Uric Acid, Serum: 5.5 mg/dL (ref 2.4–7.0)

## 2012-03-25 LAB — CBC WITH DIFFERENTIAL/PLATELET
Basophils Relative: 0.3 % (ref 0.0–3.0)
Eosinophils Absolute: 0.1 10*3/uL (ref 0.0–0.7)
MCHC: 32.6 g/dL (ref 30.0–36.0)
MCV: 92.1 fl (ref 78.0–100.0)
Monocytes Absolute: 0.4 10*3/uL (ref 0.1–1.0)
Neutrophils Relative %: 66.2 % (ref 43.0–77.0)
Platelets: 224 10*3/uL (ref 150.0–400.0)

## 2012-03-25 LAB — TSH: TSH: 2.24 u[IU]/mL (ref 0.35–5.50)

## 2012-03-25 LAB — LDL CHOLESTEROL, DIRECT: Direct LDL: 152.4 mg/dL

## 2012-03-25 MED ORDER — OMEPRAZOLE 20 MG PO CPDR: 20.0000 mg | DELAYED_RELEASE_CAPSULE | Freq: Two times a day (BID) | ORAL | Status: DC

## 2012-03-25 MED ORDER — PROPRANOLOL HCL 10 MG PO TABS: 10.0000 mg | ORAL_TABLET | Freq: Two times a day (BID) | ORAL | Status: DC

## 2012-03-25 MED ORDER — DIAZEPAM 5 MG PO TABS: 2.5000 mg | ORAL_TABLET | Freq: Two times a day (BID) | ORAL | Status: DC | PRN

## 2012-03-25 NOTE — Assessment & Plan Note (Signed)
Continue with current prescription therapy as reflected on the Med list.  

## 2012-03-25 NOTE — Assessment & Plan Note (Addendum)
The patient is here for annual Medicare wellness examination and management of other chronic and acute problems.   The risk factors are reflected in the social history.  The roster of all physicians providing medical care to patient - is listed in the Snapshot section of the chart.  Activities of daily living:  The patient is 100% inedpendent in all ADLs: dressing, toileting, feeding as well as independent mobility  Home safety : The patient has smoke detectors in the home. They wear seatbelts. There is no violence in the home.   There is no risks for hepatitis, STDs or HIV. There is no   history of blood transfusion. They have no travel history to infectious disease endemic areas of the world.  The patient has  seen their dentist in the last 12 month. They have  seen their eye doctor in the last year. They deny  Any major hearing difficulty and have not had audiologic testing in the last year.  They do not  have excessive sun exposure. Discussed the need for sun protection: hats, long sleeves and use of sunscreen if there is significant sun exposure.   Diet: the importance of a healthy diet is discussed. They do have a reasonably healthy  diet.  The patient has a fairly regular exercise program of a mixed nature: walking, yard work, etc.The benefits of regular aerobic exercise were discussed.  Depression screen: there are no signs or vegative symptoms of depression- irritability, change in appetite, anhedonia, sadness/tearfullness.  Cognitive assessment: the patient manages all their financial and personal affairs and is actively engaged. They could relate day,date,year and events; recalled 3/3 objects at 3 minutes  The following portions of the patient's history were reviewed and updated as appropriate: allergies, current medications, past family history, past medical history,  past surgical history, past social history  and problem list.  Vision, hearing, body mass index were assessed and  reviewed.   During the course of the visit the patient was educated and counseled about appropriate screening and preventive services including : fall prevention , diabetes screening, nutrition counseling, colorectal cancer screening, and recommended immunizations.  Zostavax suggested

## 2012-03-25 NOTE — Assessment & Plan Note (Signed)
BP Readings from Last 3 Encounters:  03/25/12 132/86  02/03/12 138/80  01/22/12 132/92

## 2012-03-25 NOTE — Assessment & Plan Note (Signed)
Continue with current BD exercise prescription therapy as reflected on the Med list.

## 2012-03-25 NOTE — Assessment & Plan Note (Signed)
Dr Patterson Last colon 2012 

## 2012-03-25 NOTE — Assessment & Plan Note (Signed)
Dr Jarold Motto Last colon 2012

## 2012-03-25 NOTE — Assessment & Plan Note (Signed)
Wt Readings from Last 3 Encounters:  03/25/12 179 lb 8 oz (81.421 kg)  02/03/12 176 lb (79.833 kg)  01/22/12 179 lb 2 oz (81.251 kg)

## 2012-03-25 NOTE — Assessment & Plan Note (Signed)
Chronic  Dr Elease Hashimoto She declined statins  On diet

## 2012-03-25 NOTE — Progress Notes (Signed)
   Subjective:    Patient ID: Laura Vasquez, female    DOB: [birth year], 66 y.o.   MRN: [id number]  HPI The patient is here for a wellness exam. The patient has been doing well overall without major physical or psychological issues going on lately. F/u onvertigo episodes. BD exercises have helped  She saw Dr Ezzard Standing, ENT. F/u anxiety, palpitations, HTN  BP Readings from Last 3 Encounters:  03/25/12 132/86  02/03/12 138/80  01/22/12 132/92   Wt Readings from Last 3 Encounters:  03/25/12 179 lb 8 oz (81.421 kg)  02/03/12 176 lb (79.833 kg)  01/22/12 179 lb 2 oz (81.251 kg)       Review of Systems  Constitutional: Negative for chills, activity change, appetite change, fatigue and unexpected weight change.  HENT: Negative for congestion, mouth sores and sinus pressure.   Eyes: Negative for visual disturbance.  Respiratory: Negative for cough and chest tightness.   Gastrointestinal: Negative for nausea and abdominal pain.  Genitourinary: Negative for frequency, difficulty urinating and vaginal pain.  Musculoskeletal: Negative for back pain and gait problem.  Skin: Negative for pallor and rash.  Neurological: Positive for dizziness (very mild now). Negative for tremors, weakness, numbness and headaches.  Psychiatric/Behavioral: Negative for suicidal ideas, confusion and disturbed wake/sleep cycle. The patient is nervous/anxious.        Objective:   Physical Exam  Constitutional: She appears well-developed. No distress.  HENT:  Head: Normocephalic.  Right Ear: External ear normal.  Left Ear: External ear normal.  Nose: Nose normal.  Mouth/Throat: Oropharynx is clear and moist.  Eyes: Conjunctivae are normal. Pupils are equal, round, and reactive to light. Right eye exhibits no discharge. Left eye exhibits no discharge.  Neck: Normal range of motion. Neck supple. No JVD present. No tracheal deviation present. No thyromegaly present.  Cardiovascular: Normal rate, regular rhythm  and normal heart sounds.   Pulmonary/Chest: No stridor. No respiratory distress. She has no wheezes.  Abdominal: Soft. Bowel sounds are normal. She exhibits no distension and no mass. There is no tenderness. There is no rebound and no guarding.  Musculoskeletal: She exhibits no edema and no tenderness.  Lymphadenopathy:    She has no cervical adenopathy.  Neurological: She displays normal reflexes. No cranial nerve deficit. She exhibits normal muscle tone. Coordination normal.  Skin: No rash noted. No erythema.  Psychiatric: She has a normal mood and affect. Her behavior is normal. Judgment and thought content normal.    Lab Results  Component Value Date   WBC 5.4 12/27/2011   HGB 14.3 12/27/2011   HCT 43.1 12/27/2011   PLT 200 12/27/2011   GLUCOSE 106* 12/27/2011   CHOL 275* 11/23/2011   TRIG 80.0 11/23/2011   HDL 98.20 11/23/2011   LDLDIRECT 164.0 11/23/2011   ALT 24 11/23/2011   AST 27 11/23/2011   NA 143 12/27/2011   K 4.5 12/27/2011   CL 105 12/27/2011   CREATININE 0.81 12/27/2011   BUN 19 12/27/2011   CO2 25 12/27/2011   TSH 3.11 08/07/2010         Assessment & Plan:

## 2012-06-16 ENCOUNTER — Other Ambulatory Visit: Payer: Self-pay | Admitting: Gynecology

## 2012-06-16 DIAGNOSIS — Z1231 Encounter for screening mammogram for malignant neoplasm of breast: Secondary | ICD-10-CM

## 2012-08-01 ENCOUNTER — Ambulatory Visit
Admission: RE | Admit: 2012-08-01 | Discharge: 2012-08-01 | Disposition: A | Discharge status: Home / Self Care | Payer: Medicare Other | Source: Ambulatory Visit | Attending: Gynecology | Admitting: Gynecology

## 2012-08-01 DIAGNOSIS — Z1231 Encounter for screening mammogram for malignant neoplasm of breast: Secondary | ICD-10-CM

## 2012-11-15 ENCOUNTER — Encounter: Payer: Self-pay | Admitting: Cardiovascular Disease

## 2012-11-15 ENCOUNTER — Ambulatory Visit (INDEPENDENT_AMBULATORY_CARE_PROVIDER_SITE_OTHER): Payer: Medicare Other | Admitting: Cardiovascular Disease

## 2012-11-15 VITALS — BP 152/98 | HR 82 | Ht 64.0 in | Wt 185.8 lb

## 2012-11-15 DIAGNOSIS — E785 Hyperlipidemia, unspecified: Secondary | ICD-10-CM

## 2012-11-15 DIAGNOSIS — I10 Essential (primary) hypertension: Secondary | ICD-10-CM

## 2012-11-15 MED ORDER — PROPRANOLOL HCL 10 MG PO TABS: 10.0000 mg | ORAL_TABLET | Freq: Two times a day (BID) | ORAL | Status: DC

## 2012-11-15 NOTE — Patient Instructions (Addendum)
Your physician wants you to follow-up in: 1 YEAR You will receive a reminder letter in the mail two months in advance. If you don't receive a letter, please call our office to schedule the follow-up appointment.  Your physician recommends that you continue on your current medications as directed. Please refer to the Current Medication list given to you today.   Your physician discussed the importance of regular exercise and recommended that you start or continue a regular exercise program for good health.  REDUCE HIGH SODIUM FOODS LIKE CANNED SOUP, GRAVY, SAUCES, READY PREPARED FOODS LIKE FROZEN FOODS; LEAN CUISINE, LASAGNA. BACON, SAUSAGE, LUNCH MEAT, FAST FOODS.Marland Kitchen CHIPS AND HOTDOGS  DASH Diet The DASH diet stands for "Dietary Approaches to Stop Hypertension." It is a healthy eating plan that has been shown to reduce high blood pressure (hypertension) in as little as 14 days, while also possibly providing other significant health benefits. These other health benefits include reducing the risk of breast cancer after menopause and reducing the risk of type 2 diabetes, heart disease, colon cancer, and stroke. Health benefits also include weight loss and slowing kidney failure in patients with chronic kidney disease.  DIET GUIDELINES  Limit salt (sodium). Your diet should contain less than 1500 mg of sodium daily.  Limit refined or processed carbohydrates. Your diet should include mostly whole grains. Desserts and added sugars should be used sparingly.  Include small amounts of heart-healthy fats. These types of fats include nuts, oils, and tub margarine. Limit saturated and trans fats. These fats have been shown to be harmful in the body. CHOOSING FOODS  The following food groups are based on a 2000 calorie diet. See your Registered Dietitian for individual calorie needs. Grains and Grain Products (6 to 8 servings daily)  Eat More Often: Whole-wheat bread, brown rice, whole-grain or wheat pasta,  quinoa, popcorn without added fat or salt (air popped).  Eat Less Often: White bread, white pasta, white rice, cornbread. Vegetables (4 to 5 servings daily)  Eat More Often: Fresh, frozen, and canned vegetables. Vegetables may be raw, steamed, roasted, or grilled with a minimal amount of fat.  Eat Less Often/Avoid: Creamed or fried vegetables. Vegetables in a cheese sauce. Fruit (4 to 5 servings daily)  Eat More Often: All fresh, canned (in natural juice), or frozen fruits. Dried fruits without added sugar. One hundred percent fruit juice ( cup [237 mL] daily).  Eat Less Often: Dried fruits with added sugar. Canned fruit in light or heavy syrup. [employer], Fish, and Poultry (2 servings or less daily. One serving is 3 to 4 oz [85-114 g]).  Eat More Often: Ninety percent or leaner ground beef, tenderloin, sirloin. Round cuts of beef, chicken breast, [place] breast. All fish. Grill, bake, or broil your meat. Nothing should be fried.  Eat Less Often/Avoid: Fatty cuts of meat, [place], or chicken leg, thigh, or wing. Fried cuts of meat or fish. Dairy (2 to 3 servings)  Eat More Often: Low-fat or fat-free milk, low-fat plain or light yogurt, reduced-fat or part-skim cheese.  Eat Less Often/Avoid: Milk (whole, 2%).Whole milk yogurt. Full-fat cheeses. Nuts, Seeds, and Legumes (4 to 5 servings per week)  Eat More Often: All without added salt.  Eat Less Often/Avoid: Salted nuts and seeds, canned beans with added salt. Fats and Sweets (limited)  Eat More Often: Vegetable oils, tub margarines without trans fats, sugar-free gelatin. Mayonnaise and salad dressings.  Eat Less Often/Avoid: Coconut oils, palm oils, butter, stick margarine, cream, half and half, cookies,  candy, pie. FOR MORE INFORMATION The Dash Diet Eating Plan: www.dashdiet.org Document Released: 07/23/2011 Document Revised: 10/26/2011 Document Reviewed: 07/23/2011 [facility] Patient Information 2013 [place], [place].

## 2012-11-15 NOTE — Progress Notes (Signed)
[name] Date of Birth  10/17/1945 [facility]     [place] [facility]  [address]    [address]   [address] [place], [place]  [address]    [place], [place]  [address] [phone]  Fax  [phone]  [phone]  Fax [phone]  Problem list: 1. Hypercholesterolemia 2. Fibromyalgia 3. Anxiety 4. Mild mitral regurgitation 5. Palpitations   History of Present Illness:  Jaquesha is a 67 year old female who I follow mostly for palpitations. She also has a history of hyperlipidemia, fibromyalgia, mild mitral regurgitation. She measures her blood pressure and heart rate on right basis. She's quite worried by the fact that these readings are somewhat variable. I've reassured her that these are normal readings.  November 15, 2012:  She is ding well.  BP is high here - is more normal at home.  Denies any chest pain.  Occasionally short of breath - not exercising regularly - looking forward to getting back to the gym.    Current Outpatient Prescriptions on File Prior to Visit  Medication Sig Dispense Refill  . aspirin EC 81 MG tablet Take 81 mg by mouth daily.      . calcium carbonate (TUMS - DOSED IN MG ELEMENTAL CALCIUM) 500 MG chewable tablet Chew 1 tablet by mouth as needed.       . cholecalciferol (VITAMIN D) 1000 UNITS tablet Take 2,000 Units by mouth daily.        Marland Kitchen MAGNESIUM PO Take 250 mg by mouth.       . meclizine (ANTIVERT) 25 MG tablet Take 25 mg by mouth as needed.       . Omega-3 Fatty Acids (FISH OIL PO) Take 1 each by mouth daily.        Marland Kitchen omeprazole (PRILOSEC) 20 MG capsule Take 1 capsule (20 mg total) by mouth 2 (two) times daily.  60 capsule  11  . propranolol (INDERAL) 10 MG tablet Take 1 tablet (10 mg total) by mouth 2 (two) times daily.  60 tablet  11  . diazepam (VALIUM) 5 MG tablet Take 0.5-1 tablets (2.5-5 mg total) by mouth 2 (two) times daily as needed. For anxiety.  60 tablet  3   No current facility-administered medications on file prior to  visit.    Allergies  Allergen Reactions  . Amoxicillin-Pot Clavulanate   . Amoxicillin-Pot Clavulanate   . Codeine   . Naproxen   . Sulfonamide Derivatives     Past Medical History  Diagnosis Date  . Anxiety   . Hypertension   . Allergy   . GERD (gastroesophageal reflux disease)   . Hyperlipidemia   . Fibromyalgia   . Mitral regurgitation   . Palpitations   . IBS (irritable bowel syndrome)   . Vitamin B12 deficiency   . Hyperplastic colon polyp   . Peptic stricture of esophagus   . Erosive esophagitis   . Osteoarthritis   . Fatty liver   . Arrhythmia     Past Surgical History  Procedure Laterality Date  . Cholecystectomy    . Tubal ligation    . Appendectomy    . Abdominal hysterectomy      History  Smoking status  . Former Smoker  . Quit date: 08/18/1991  Smokeless tobacco  . Never Used    History  Alcohol Use No    Comment: 1-2 glasses     Family History  Problem Relation Age of Onset  . Hypertension Mother   .  Ovarian cancer Mother   . Hypertension Father   . Ovarian cancer Sister   . Fibromyalgia Sister     x 2  . Colon cancer Neg Hx   . Diabetes Sister     Reviw of Systems:  Reviewed in the HPI.  All other systems are negative.  Physical Exam: Blood pressure 152/98, pulse 82, height 5\' 4"  (1.626 m), weight 185 lb 12.8 oz (84.278 kg), SpO2 98.00%.  BP readings are normal at home.   General: Well developed, well nourished, in no acute distress.  Head: Normocephalic, atraumatic, sclera non-icteric, mucus membranes are moist,   Neck: Supple. Carotids are 2 + without bruits. No JVD  Lungs: Clear bilaterally to auscultation.  Heart: regular rate.  normal  S1 S2. No murmurs, gallops or rubs. Has occasional premature beats.  Abdomen: Soft, non-tender, non-distended with normal bowel sounds. No hepatomegaly. No rebound/guarding. No masses.  Msk:  Strength and tone are normal  Extremities: No clubbing or cyanosis. No edema.  Distal  pedal pulses are 2+ and equal bilaterally.  Neuro: Alert and oriented X 3. Moves all extremities spontaneously.  Psych:  Responds to questions appropriately with a normal affect.  ECG: Sinus rhythm with marked sinus arrhythmia. She has nonspecific ST and T wave changes. The EKG is basically unchanged from her previous tracings.  Assessment / Plan:

## 2012-11-15 NOTE — Assessment & Plan Note (Signed)
Her LDL is still high.   She does not want to take a statin but wants to work on diet and exercise.

## 2012-11-15 NOTE — Assessment & Plan Note (Signed)
Her BP is normal by her readings.  It is a bit elevated today.  I have encouraged her to work out  And to avoid salt

## 2013-03-02 ENCOUNTER — Other Ambulatory Visit: Payer: Self-pay | Admitting: Obstetrics and Gynecology

## 2013-03-02 DIAGNOSIS — M8580 Other specified disorders of bone density and structure, unspecified site: Secondary | ICD-10-CM

## 2013-03-15 ENCOUNTER — Other Ambulatory Visit: Payer: Self-pay | Admitting: Internal Medicine

## 2013-03-16 NOTE — Telephone Encounter (Signed)
Refill request for Valium Last filled by MD on - 04/04/12 #60 x3 Last seen- 03/25/12 F/U appt: none  Please advise refill?

## 2013-04-03 ENCOUNTER — Ambulatory Visit (INDEPENDENT_AMBULATORY_CARE_PROVIDER_SITE_OTHER): Payer: Medicare Other | Admitting: Internal Medicine

## 2013-04-03 ENCOUNTER — Other Ambulatory Visit (INDEPENDENT_AMBULATORY_CARE_PROVIDER_SITE_OTHER): Payer: Medicare Other

## 2013-04-03 ENCOUNTER — Encounter: Payer: Self-pay | Admitting: Internal Medicine

## 2013-04-03 VITALS — BP 160/100 | HR 72 | Temp 98.2°F | Resp 16 | Ht 63.5 in | Wt 185.0 lb

## 2013-04-03 DIAGNOSIS — F411 Generalized anxiety disorder: Secondary | ICD-10-CM

## 2013-04-03 DIAGNOSIS — E538 Deficiency of other specified B group vitamins: Secondary | ICD-10-CM

## 2013-04-03 DIAGNOSIS — E785 Hyperlipidemia, unspecified: Secondary | ICD-10-CM

## 2013-04-03 DIAGNOSIS — Z0000 Encounter for general adult medical examination without abnormal findings: Secondary | ICD-10-CM

## 2013-04-03 DIAGNOSIS — I10 Essential (primary) hypertension: Secondary | ICD-10-CM

## 2013-04-03 DIAGNOSIS — R002 Palpitations: Secondary | ICD-10-CM

## 2013-04-03 LAB — CBC WITH DIFFERENTIAL/PLATELET
Basophils Relative: 0.5 % (ref 0.0–3.0)
Eosinophils Relative: 1.1 % (ref 0.0–5.0)
HCT: 45 % (ref 36.0–46.0)
Lymphs Abs: 1.4 10*3/uL (ref 0.7–4.0)
MCV: 89.7 fl (ref 78.0–100.0)
Monocytes Absolute: 0.4 10*3/uL (ref 0.1–1.0)
Neutro Abs: 4.3 10*3/uL (ref 1.4–7.7)
RBC: 5.02 Mil/uL (ref 3.87–5.11)
WBC: 6.2 10*3/uL (ref 4.5–10.5)

## 2013-04-03 LAB — BASIC METABOLIC PANEL
BUN: 11 mg/dL (ref 6–23)
Calcium: 10 mg/dL (ref 8.4–10.5)
GFR: 82.66 mL/min (ref >60.00)
Glucose, Bld: 102 mg/dL — ABNORMAL HIGH (ref 70–99)

## 2013-04-03 LAB — HEPATIC FUNCTION PANEL
ALT: 30 U/L (ref 0–35)
AST: 28 U/L (ref 0–37)
Alkaline Phosphatase: 38 U/L — ABNORMAL LOW (ref 39–117)
Total Bilirubin: 0.9 mg/dL (ref 0.3–1.2)

## 2013-04-03 LAB — URINALYSIS
Bilirubin Urine: NEGATIVE
Total Protein, Urine: NEGATIVE
Urine Glucose: NEGATIVE

## 2013-04-03 LAB — LIPID PANEL
Cholesterol: 265 mg/dL — ABNORMAL HIGH (ref 0–200)
Total CHOL/HDL Ratio: 3

## 2013-04-03 MED ORDER — DIAZEPAM 5 MG PO TABS: ORAL_TABLET | ORAL | Status: DC

## 2013-04-03 MED ORDER — OMEPRAZOLE 20 MG PO CPDR: 20.0000 mg | DELAYED_RELEASE_CAPSULE | Freq: Two times a day (BID) | ORAL | Status: DC

## 2013-04-03 MED ORDER — PROPRANOLOL HCL 10 MG PO TABS: 10.0000 mg | ORAL_TABLET | Freq: Two times a day (BID) | ORAL | Status: DC

## 2013-04-03 NOTE — Assessment & Plan Note (Signed)

## 2013-04-03 NOTE — Assessment & Plan Note (Signed)
Chronic  Dr Elease Hashimoto She declined statins  Labs

## 2013-04-03 NOTE — Progress Notes (Signed)
   Subjective:    HPI  The patient is here for a wellness exam. The patient has been doing well overall without major physical or psychological issues going on lately. F/u onvertigo episodes. BD exercises have helped  She saw Dr Ezzard Standing, ENT. F/u anxiety, palpitations, HTN. BP is nl at home. She saw Dr Elease Hashimoto. Taking Propranolol 1 a day prn: otherwise her HR is too low... BP 160/100 rechecked  BP Readings from Last 3 Encounters:  04/03/13 170/100  11/15/12 152/98  03/25/12 132/86   Wt Readings from Last 3 Encounters:  04/03/13 185 lb (83.915 kg)  11/15/12 185 lb 12.8 oz (84.278 kg)  03/25/12 179 lb 8 oz (81.421 kg)       Review of Systems  Constitutional: Negative for chills, activity change, appetite change, fatigue and unexpected weight change.  HENT: Negative for congestion, mouth sores and sinus pressure.   Eyes: Negative for visual disturbance.  Respiratory: Negative for cough and chest tightness.   Gastrointestinal: Negative for nausea and abdominal pain.  Genitourinary: Negative for frequency, difficulty urinating and vaginal pain.  Musculoskeletal: Negative for back pain and gait problem.  Skin: Negative for pallor and rash.  Neurological: Positive for dizziness (very mild now). Negative for tremors, weakness, numbness and headaches.  Psychiatric/Behavioral: Negative for suicidal ideas, confusion and sleep disturbance. The patient is nervous/anxious.        Objective:   Physical Exam  Constitutional: She appears well-developed. No distress.  HENT:  Head: Normocephalic.  Right Ear: External ear normal.  Left Ear: External ear normal.  Nose: Nose normal.  Mouth/Throat: Oropharynx is clear and moist.  Eyes: Conjunctivae are normal. Pupils are equal, round, and reactive to light. Right eye exhibits no discharge. Left eye exhibits no discharge.  Neck: Normal range of motion. Neck supple. No JVD present. No tracheal deviation present. No thyromegaly present.   Cardiovascular: Normal rate, regular rhythm and normal heart sounds.   Pulmonary/Chest: No stridor. No respiratory distress. She has no wheezes.  Abdominal: Soft. Bowel sounds are normal. She exhibits no distension and no mass. There is no tenderness. There is no rebound and no guarding.  Musculoskeletal: She exhibits no edema and no tenderness.  Lymphadenopathy:    She has no cervical adenopathy.  Neurological: She displays normal reflexes. No cranial nerve deficit. She exhibits normal muscle tone. Coordination normal.  Skin: No rash noted. No erythema.  Psychiatric: She has a normal mood and affect. Her behavior is normal. Judgment and thought content normal.    Lab Results  Component Value Date   WBC 6.4 03/25/2012   HGB 14.4 03/25/2012   HCT 44.3 03/25/2012   PLT 224.0 03/25/2012   GLUCOSE 98 03/25/2012   CHOL 269* 03/25/2012   TRIG 82.0 03/25/2012   HDL 102.50 03/25/2012   LDLDIRECT 152.4 03/25/2012   ALT 28 03/25/2012   AST 28 03/25/2012   NA 139 03/25/2012   K 4.9 03/25/2012   CL 102 03/25/2012   CREATININE 0.8 03/25/2012   BUN 13 03/25/2012   CO2 29 03/25/2012   TSH 2.24 03/25/2012         Assessment & Plan:

## 2013-04-03 NOTE — Assessment & Plan Note (Signed)
Re-start B12 Labs 

## 2013-04-03 NOTE — Assessment & Plan Note (Signed)
She can take propranolol 10 mg 1/2 po bid

## 2013-04-03 NOTE — Assessment & Plan Note (Signed)
Continue with current prescription therapy as reflected on the Med list.  

## 2013-04-03 NOTE — Patient Instructions (Signed)
Gluten free trial (no wheat products) for 4-6 weeks. OK to use gluten-free bread and gluten-free pasta.  Milk free trial (no milk, ice cream, cheese and yogurt) for 4-6 weeks. OK to use almond, coconut, rice or soy milk.  

## 2013-04-03 NOTE — Assessment & Plan Note (Signed)
Propranolol should be helpful

## 2013-05-06 ENCOUNTER — Other Ambulatory Visit: Payer: Self-pay | Admitting: Internal Medicine

## 2013-06-14 ENCOUNTER — Ambulatory Visit (INDEPENDENT_AMBULATORY_CARE_PROVIDER_SITE_OTHER): Payer: Medicare Other

## 2013-06-14 DIAGNOSIS — Z23 Encounter for immunization: Secondary | ICD-10-CM

## 2013-07-03 ENCOUNTER — Other Ambulatory Visit: Payer: Self-pay

## 2013-07-03 DIAGNOSIS — Z1231 Encounter for screening mammogram for malignant neoplasm of breast: Secondary | ICD-10-CM

## 2013-08-07 ENCOUNTER — Encounter: Payer: Self-pay | Admitting: Internal Medicine

## 2013-08-07 ENCOUNTER — Ambulatory Visit (INDEPENDENT_AMBULATORY_CARE_PROVIDER_SITE_OTHER): Payer: Medicare Other | Admitting: Internal Medicine

## 2013-08-07 VITALS — BP 148/96 | HR 80 | Temp 97.7°F | Resp 16 | Wt 188.0 lb

## 2013-08-07 DIAGNOSIS — J0120 Acute ethmoidal sinusitis, unspecified: Secondary | ICD-10-CM

## 2013-08-07 DIAGNOSIS — F411 Generalized anxiety disorder: Secondary | ICD-10-CM

## 2013-08-07 MED ORDER — CEFUROXIME AXETIL 500 MG PO TABS: ORAL_TABLET | ORAL | Status: DC

## 2013-08-07 NOTE — Patient Instructions (Signed)
Use over-the-counter  "cold" medicines  such as  "Afrin" nasal spray for nasal congestion as directed instead. Use" Delsym" or" Robitussin" cough syrup varietis for cough.  You can use plain "Tylenol" or "Advil" for fever, chills and achyness.  Please, make an appointment if you are not better or if you're worse.  

## 2013-08-07 NOTE — Progress Notes (Signed)
Pre visit review using our clinic review tool, if applicable. No additional management support is needed unless otherwise documented below in the visit note. 

## 2013-08-07 NOTE — Assessment & Plan Note (Signed)
Ceftin x10 d 

## 2013-08-08 ENCOUNTER — Telehealth: Payer: Self-pay | Admitting: Internal Medicine

## 2013-08-08 MED ORDER — AZITHROMYCIN 250 MG PO TABS: ORAL_TABLET | ORAL | Status: DC

## 2013-08-08 NOTE — Telephone Encounter (Signed)
Pt informed

## 2013-08-08 NOTE — Telephone Encounter (Signed)
Pls stop Ceftin Use OTC cortaid cream on lips, take Claritin Zpac emailed Thx

## 2013-08-08 NOTE — Telephone Encounter (Signed)
Pt was here yesterday.  Ceftin is causing her lips to be hot. Face and lips itch.  She wants to know is she should get a z-pack instead.  Pharmacy is  [facility] on [address].  What should she do?

## 2013-08-14 ENCOUNTER — Other Ambulatory Visit: Payer: Medicare Other

## 2013-08-14 ENCOUNTER — Ambulatory Visit
Admission: RE | Admit: 2013-08-14 | Discharge: 2013-08-14 | Disposition: A | Discharge status: Home / Self Care | Payer: Medicare Other | Source: Ambulatory Visit

## 2013-08-14 DIAGNOSIS — Z1231 Encounter for screening mammogram for malignant neoplasm of breast: Secondary | ICD-10-CM

## 2013-09-05 ENCOUNTER — Telehealth: Payer: Self-pay | Admitting: Cardiovascular Disease

## 2013-09-05 NOTE — Telephone Encounter (Signed)
I will forward to Dr Acie Fredrickson

## 2013-09-05 NOTE — Telephone Encounter (Signed)
Her primary doctor appears to be Dr. Alain Marion.  Dr. Sharlett Iles is a GI doctor.  There are many GI doctors in that practice who will take his place

## 2013-09-05 NOTE — Telephone Encounter (Signed)
New message    Dr Sharlett Iles at the [place] office is retiring in [place].  Who would Dr Acie Fredrickson recommed for a PCP in that practice?

## 2013-09-06 NOTE — Telephone Encounter (Signed)
No voice mailbox available/ will try later.

## 2013-09-07 NOTE — Telephone Encounter (Signed)
She was given the advice// this is actually for Laura Vasquez/ husband. Clearance will come for app  09/26/13 colonoscopy.

## 2013-10-10 ENCOUNTER — Other Ambulatory Visit (INDEPENDENT_AMBULATORY_CARE_PROVIDER_SITE_OTHER): Payer: Medicare Other

## 2013-10-10 ENCOUNTER — Encounter: Payer: Self-pay | Admitting: Internal Medicine

## 2013-10-10 ENCOUNTER — Ambulatory Visit (INDEPENDENT_AMBULATORY_CARE_PROVIDER_SITE_OTHER): Payer: Medicare Other | Admitting: Internal Medicine

## 2013-10-10 VITALS — BP 130/82 | HR 59 | Temp 98.0°F | Ht 64.0 in | Wt 184.5 lb

## 2013-10-10 DIAGNOSIS — R1032 Left lower quadrant pain: Secondary | ICD-10-CM

## 2013-10-10 DIAGNOSIS — E538 Deficiency of other specified B group vitamins: Secondary | ICD-10-CM

## 2013-10-10 DIAGNOSIS — I10 Essential (primary) hypertension: Secondary | ICD-10-CM

## 2013-10-10 DIAGNOSIS — J309 Allergic rhinitis, unspecified: Secondary | ICD-10-CM

## 2013-10-10 DIAGNOSIS — D582 Other hemoglobinopathies: Secondary | ICD-10-CM

## 2013-10-10 LAB — URINALYSIS
BILIRUBIN URINE: NEGATIVE
Hgb urine dipstick: NEGATIVE
Ketones, ur: NEGATIVE
Leukocytes, UA: NEGATIVE
Nitrite: NEGATIVE
Specific Gravity, Urine: 1.01 (ref 1.000–1.030)
Total Protein, Urine: NEGATIVE
URINE GLUCOSE: NEGATIVE
Urobilinogen, UA: 0.2 (ref 0.0–1.0)
pH: 7 (ref 5.0–8.0)

## 2013-10-10 LAB — BASIC METABOLIC PANEL
BUN: 12 mg/dL (ref 6–23)
CHLORIDE: 102 meq/L (ref 96–112)
CO2: 27 mEq/L (ref 19–32)
CREATININE: 0.8 mg/dL (ref 0.4–1.2)
Calcium: 10.2 mg/dL (ref 8.4–10.5)
GFR: 78.84 mL/min (ref >60.00)
Glucose, Bld: 96 mg/dL (ref 70–99)
Potassium: 5 mEq/L (ref 3.5–5.1)
Sodium: 139 mEq/L (ref 135–145)

## 2013-10-10 LAB — CBC WITH DIFFERENTIAL/PLATELET
Basophils Absolute: 0 10*3/uL (ref 0.0–0.1)
Basophils Relative: 0.3 % (ref 0.0–3.0)
EOS ABS: 0.1 10*3/uL (ref 0.0–0.7)
EOS PCT: 1.3 % (ref 0.0–5.0)
HCT: 46.8 % — ABNORMAL HIGH (ref 36.0–46.0)
HEMOGLOBIN: 15.6 g/dL — AB (ref 12.0–15.0)
Lymphocytes Relative: 24.7 % (ref 12.0–46.0)
Lymphs Abs: 2 10*3/uL (ref 0.7–4.0)
MCHC: 33.3 g/dL (ref 30.0–36.0)
MCV: 92.1 fl (ref 78.0–100.0)
MONO ABS: 0.5 10*3/uL (ref 0.1–1.0)
Monocytes Relative: 6.3 % (ref 3.0–12.0)
NEUTROS ABS: 5.4 10*3/uL (ref 1.4–7.7)
Neutrophils Relative %: 67.4 % (ref 43.0–77.0)
Platelets: 243 10*3/uL (ref 150.0–400.0)
RBC: 5.09 Mil/uL (ref 3.87–5.11)
RDW: 13.9 % (ref 11.5–14.6)
WBC: 8 10*3/uL (ref 4.5–10.5)

## 2013-10-10 LAB — HEPATIC FUNCTION PANEL
ALBUMIN: 4.7 g/dL (ref 3.5–5.2)
ALT: 34 U/L (ref 0–35)
AST: 32 U/L (ref 0–37)
Alkaline Phosphatase: 42 U/L (ref 39–117)
BILIRUBIN TOTAL: 1.1 mg/dL (ref 0.3–1.2)
Bilirubin, Direct: 0.1 mg/dL (ref 0.0–0.3)
Total Protein: 8.2 g/dL (ref 6.0–8.3)

## 2013-10-10 LAB — SEDIMENTATION RATE: Sed Rate: 8 mm/hr (ref 0–22)

## 2013-10-10 MED ORDER — OMEPRAZOLE 20 MG PO CPDR: 20.0000 mg | DELAYED_RELEASE_CAPSULE | Freq: Every day | ORAL | Status: DC

## 2013-10-10 MED ORDER — OMEPRAZOLE 20 MG PO CPDR: 20.0000 mg | DELAYED_RELEASE_CAPSULE | Freq: Two times a day (BID) | ORAL | Status: DC

## 2013-10-10 MED ORDER — LEVOFLOXACIN 500 MG PO TABS: 500.0000 mg | ORAL_TABLET | Freq: Every day | ORAL | Status: DC

## 2013-10-10 NOTE — Progress Notes (Signed)
Subjective:    Abdominal Pain This is a new problem. The current episode started more than 1 month ago. The onset quality is gradual. The problem occurs 2 to 4 times per day. The problem has been waxing and waning. The pain is located in the LLQ. The pain is moderate. The quality of the pain is dull. The abdominal pain does not radiate. Pertinent negatives include no fever, frequency, headaches, hematochezia, nausea or weight loss. The pain is aggravated by eating. Treatments tried: align. The treatment provided no relief. Prior diagnostic workup includes lower endoscopy. There is no history of colon cancer.     F/u on vertigo episodes. BD exercises have helped  She saw Dr Lucia Gaskins, ENT. F/u anxiety, palpitations, HTN. BP is nl at home. She saw Dr Acie Fredrickson. Taking Propranolol 1 a day prn: otherwise her HR is too low...   BP Readings from Last 3 Encounters:  10/10/13 130/82  08/07/13 148/96  04/03/13 160/100   Wt Readings from Last 3 Encounters:  10/10/13 184 lb 8 oz (83.689 kg)  08/07/13 188 lb (85.276 kg)  04/03/13 185 lb (83.915 kg)       Review of Systems  Constitutional: Negative for fever, chills, weight loss, activity change, appetite change, fatigue and unexpected weight change.  HENT: Negative for congestion, mouth sores and sinus pressure.   Eyes: Negative for visual disturbance.  Respiratory: Negative for cough and chest tightness.   Gastrointestinal: Positive for abdominal pain. Negative for nausea and hematochezia.  Genitourinary: Negative for frequency, difficulty urinating and vaginal pain.  Musculoskeletal: Negative for back pain and gait problem.  Skin: Negative for pallor and rash.  Neurological: Positive for dizziness (very mild now). Negative for tremors, weakness, numbness and headaches.  Psychiatric/Behavioral: Negative for suicidal ideas, confusion and sleep disturbance. The patient is nervous/anxious.        Objective:   Physical Exam  Constitutional:  She appears well-developed. No distress.  HENT:  Head: Normocephalic.  Right Ear: External ear normal.  Left Ear: External ear normal.  Nose: Nose normal.  Mouth/Throat: Oropharynx is clear and moist.  Eyes: Conjunctivae are normal. Pupils are equal, round, and reactive to light. Right eye exhibits no discharge. Left eye exhibits no discharge.  Neck: Normal range of motion. Neck supple. No JVD present. No tracheal deviation present. No thyromegaly present.  Cardiovascular: Normal rate, regular rhythm and normal heart sounds.   Pulmonary/Chest: No stridor. No respiratory distress. She has no wheezes.  Abdominal: Soft. Bowel sounds are normal. She exhibits no distension and no mass. There is no tenderness. There is no rebound and no guarding.  Musculoskeletal: She exhibits no edema and no tenderness.  Lymphadenopathy:    She has no cervical adenopathy.  Neurological: She displays normal reflexes. No cranial nerve deficit. She exhibits normal muscle tone. Coordination normal.  Skin: No rash noted. No erythema.  Psychiatric: She has a normal mood and affect. Her behavior is normal. Judgment and thought content normal.  LLQ is sensitive  Lab Results  Component Value Date   WBC 6.2 04/03/2013   HGB 15.3* 04/03/2013   HCT 45.0 04/03/2013   PLT 219.0 04/03/2013   GLUCOSE 102* 04/03/2013   CHOL 265* 04/03/2013   TRIG 93.0 04/03/2013   HDL 81.80 04/03/2013   LDLDIRECT 172.9 04/03/2013   ALT 30 04/03/2013   AST 28 04/03/2013   NA 140 04/03/2013   K 5.0 04/03/2013   CL 105 04/03/2013   CREATININE 0.7 04/03/2013   BUN 11 04/03/2013  CO2 27 04/03/2013   TSH 2.30 04/03/2013         Assessment & Plan:

## 2013-10-10 NOTE — Assessment & Plan Note (Signed)
Continue with current prescription therapy as reflected on the Med list.  

## 2013-10-10 NOTE — Patient Instructions (Signed)
Low residue diet x 10 days 

## 2013-10-10 NOTE — Assessment & Plan Note (Signed)
Labs CT abd PO Levaquin x 10 d Probiotic

## 2013-10-10 NOTE — Progress Notes (Signed)
Pre-visit discussion using our clinic review tool. No additional management support is needed unless otherwise documented below in the visit note.  

## 2013-10-10 NOTE — Assessment & Plan Note (Signed)
Continue with current prn prescription therapy as reflected on the Med list.  

## 2013-10-11 ENCOUNTER — Telehealth: Payer: Self-pay | Admitting: Internal Medicine

## 2013-10-11 DIAGNOSIS — D582 Other hemoglobinopathies: Secondary | ICD-10-CM | POA: Insufficient documentation

## 2013-10-11 NOTE — Assessment & Plan Note (Signed)
Mild R/o OSA  Repeat CBC. Sleep test suggested

## 2013-10-11 NOTE — Telephone Encounter (Signed)
Patient is returning a call about her lab results.

## 2013-10-13 ENCOUNTER — Ambulatory Visit (INDEPENDENT_AMBULATORY_CARE_PROVIDER_SITE_OTHER)
Admission: RE | Admit: 2013-10-13 | Discharge: 2013-10-13 | Disposition: A | Discharge status: Home / Self Care | Payer: Medicare Other | Source: Ambulatory Visit | Attending: Internal Medicine | Admitting: Internal Medicine

## 2013-10-13 DIAGNOSIS — R1032 Left lower quadrant pain: Secondary | ICD-10-CM

## 2013-10-13 MED ORDER — IOHEXOL 300 MG/ML  SOLN
100.0000 mL | Freq: Once | INTRAMUSCULAR | Status: AC | PRN
  Administered 2013-10-13: 100 mL via INTRAVENOUS

## 2013-10-13 NOTE — Telephone Encounter (Signed)
Noted OK Metamucil Take Levaquin w/food - ok to stop after 7 days Thx

## 2013-10-13 NOTE — Telephone Encounter (Signed)
Pt informed of recent labs and CT of abdomen/pelvis. She does not think her snoring is prominent enough for sleep study. She is coming for f/u in 4 weeks with PCP. She will discuss sleep study tests then.   Since starting Levaquin she hasnt been feeling well and has upset stomach. Doe she need to continue Levaquin? She has started low fat/residue diet. Since she is not eating any raw veggies, is it ok to use metamucil?

## 2013-10-13 NOTE — Telephone Encounter (Signed)
Pt informed of below.  

## 2013-11-08 ENCOUNTER — Ambulatory Visit (INDEPENDENT_AMBULATORY_CARE_PROVIDER_SITE_OTHER): Payer: Medicare Other | Admitting: Internal Medicine

## 2013-11-08 ENCOUNTER — Encounter: Payer: Self-pay | Admitting: Internal Medicine

## 2013-11-08 VITALS — BP 140/90 | HR 80 | Temp 98.2°F | Resp 16 | Wt 185.0 lb

## 2013-11-08 DIAGNOSIS — D582 Other hemoglobinopathies: Secondary | ICD-10-CM

## 2013-11-08 DIAGNOSIS — R0609 Other forms of dyspnea: Secondary | ICD-10-CM

## 2013-11-08 DIAGNOSIS — I10 Essential (primary) hypertension: Secondary | ICD-10-CM

## 2013-11-08 DIAGNOSIS — R0989 Other specified symptoms and signs involving the circulatory and respiratory systems: Secondary | ICD-10-CM

## 2013-11-08 DIAGNOSIS — R0683 Snoring: Secondary | ICD-10-CM

## 2013-11-08 DIAGNOSIS — F411 Generalized anxiety disorder: Secondary | ICD-10-CM

## 2013-11-08 DIAGNOSIS — K5730 Diverticulosis of large intestine without perforation or abscess without bleeding: Secondary | ICD-10-CM

## 2013-11-08 NOTE — Assessment & Plan Note (Signed)
Chronic. 

## 2013-11-08 NOTE — Progress Notes (Signed)
Subjective:   F/u Abdominal Pain This is a recurrent problem. The current episode started more than 1 month ago. The onset quality is gradual. The problem occurs 2 to 4 times per day. The problem has been resolved. The pain is located in the LLQ. The patient is experiencing no pain. The quality of the pain is dull. The abdominal pain does not radiate. Pertinent negatives include no fever, frequency, headaches, hematochezia, nausea or weight loss. The pain is aggravated by eating. Treatments tried: align. The treatment provided no relief. Prior diagnostic workup includes lower endoscopy. There is no history of colon cancer.     F/u on vertigo episodes. BD exercises have helped  She saw Dr Lucia Gaskins, ENT.   F/u anxiety, palpitations, HTN. BP is nl at home. She saw Dr Acie Fredrickson. Taking Propranolol 1 a day prn: otherwise her HR is too low...   BP Readings from Last 3 Encounters:  11/08/13 140/90  10/10/13 130/82  08/07/13 148/96   Wt Readings from Last 3 Encounters:  11/08/13 185 lb (83.915 kg)  10/10/13 184 lb 8 oz (83.689 kg)  08/07/13 188 lb (85.276 kg)       Review of Systems  Constitutional: Negative for fever, chills, weight loss, activity change, appetite change, fatigue and unexpected weight change.  HENT: Negative for congestion, mouth sores and sinus pressure.   Eyes: Negative for visual disturbance.  Respiratory: Negative for cough and chest tightness.   Gastrointestinal: Positive for abdominal pain. Negative for nausea and hematochezia.  Genitourinary: Negative for frequency, difficulty urinating and vaginal pain.  Musculoskeletal: Negative for back pain and gait problem.  Skin: Negative for pallor and rash.  Neurological: Positive for dizziness (very mild now). Negative for tremors, weakness, numbness and headaches.  Psychiatric/Behavioral: Negative for suicidal ideas, confusion and sleep disturbance. The patient is nervous/anxious.        Objective:   Physical Exam   Constitutional: She appears well-developed. No distress.  HENT:  Head: Normocephalic.  Right Ear: External ear normal.  Left Ear: External ear normal.  Nose: Nose normal.  Mouth/Throat: Oropharynx is clear and moist.  Eyes: Conjunctivae are normal. Pupils are equal, round, and reactive to light. Right eye exhibits no discharge. Left eye exhibits no discharge.  Neck: Normal range of motion. Neck supple. No JVD present. No tracheal deviation present. No thyromegaly present.  Cardiovascular: Normal rate, regular rhythm and normal heart sounds.   Pulmonary/Chest: No stridor. No respiratory distress. She has no wheezes.  Abdominal: Soft. Bowel sounds are normal. She exhibits no distension and no mass. There is no tenderness. There is no rebound and no guarding.  Musculoskeletal: She exhibits no edema and no tenderness.  Lymphadenopathy:    She has no cervical adenopathy.  Neurological: She displays normal reflexes. No cranial nerve deficit. She exhibits normal muscle tone. Coordination normal.  Skin: No rash noted. No erythema.  Psychiatric: She has a normal mood and affect. Her behavior is normal. Judgment and thought content normal.  LLQ is ok  Lab Results  Component Value Date   WBC 8.0 10/10/2013   HGB 15.6* 10/10/2013   HCT 46.8* 10/10/2013   PLT 243.0 10/10/2013   GLUCOSE 96 10/10/2013   CHOL 265* 04/03/2013   TRIG 93.0 04/03/2013   HDL 81.80 04/03/2013   LDLDIRECT 172.9 04/03/2013   ALT 34 10/10/2013   AST 32 10/10/2013   NA 139 10/10/2013   K 5.0 10/10/2013   CL 102 10/10/2013   CREATININE 0.8 10/10/2013   BUN 12  10/10/2013   CO2 27 10/10/2013   TSH 2.30 04/03/2013         Assessment & Plan:

## 2013-11-08 NOTE — Progress Notes (Signed)
Pre visit review using our clinic review tool, if applicable. No additional management support is needed unless otherwise documented below in the visit note. 

## 2013-11-08 NOTE — Assessment & Plan Note (Signed)
3/15 Mild Pt denies OSA Declined sleep test

## 2013-11-08 NOTE — Assessment & Plan Note (Signed)
BP is nl at home 

## 2013-11-08 NOTE — Assessment & Plan Note (Signed)
Doing well 

## 2013-11-08 NOTE — Assessment & Plan Note (Signed)
Pt declined a sleep test 

## 2013-11-09 ENCOUNTER — Telehealth: Payer: Self-pay | Admitting: Internal Medicine

## 2013-11-09 NOTE — Telephone Encounter (Signed)
emmi mailed to patient °

## 2013-12-18 ENCOUNTER — Other Ambulatory Visit: Payer: Self-pay | Admitting: Internal Medicine

## 2014-01-17 ENCOUNTER — Encounter: Payer: Self-pay | Admitting: Internal Medicine

## 2014-01-19 ENCOUNTER — Ambulatory Visit (INDEPENDENT_AMBULATORY_CARE_PROVIDER_SITE_OTHER): Payer: Medicare Other | Admitting: Cardiovascular Disease

## 2014-01-19 ENCOUNTER — Encounter: Payer: Self-pay | Admitting: Cardiovascular Disease

## 2014-01-19 VITALS — BP 142/100 | HR 64 | Ht 64.0 in | Wt 178.2 lb

## 2014-01-19 DIAGNOSIS — R002 Palpitations: Secondary | ICD-10-CM

## 2014-01-19 DIAGNOSIS — E785 Hyperlipidemia, unspecified: Secondary | ICD-10-CM

## 2014-01-19 DIAGNOSIS — I10 Essential (primary) hypertension: Secondary | ICD-10-CM

## 2014-01-19 LAB — LIPID PANEL
CHOLESTEROL: 272 mg/dL — AB (ref 0–200)
HDL: 101.9 mg/dL (ref >39.00)
LDL Cholesterol: 158 mg/dL — ABNORMAL HIGH (ref 0–99)
NonHDL: 170.1
Total CHOL/HDL Ratio: 3
Triglycerides: 60 mg/dL (ref 0.0–149.0)
VLDL: 12 mg/dL (ref 0.0–40.0)

## 2014-01-19 LAB — HEPATIC FUNCTION PANEL
ALBUMIN: 4.6 g/dL (ref 3.5–5.2)
ALK PHOS: 41 U/L (ref 39–117)
ALT: 34 U/L (ref 0–35)
AST: 31 U/L (ref 0–37)
Bilirubin, Direct: 0.2 mg/dL (ref 0.0–0.3)
TOTAL PROTEIN: 7.6 g/dL (ref 6.0–8.3)
Total Bilirubin: 1 mg/dL (ref 0.2–1.2)

## 2014-01-19 LAB — BASIC METABOLIC PANEL
BUN: 12 mg/dL (ref 6–23)
CALCIUM: 10.3 mg/dL (ref 8.4–10.5)
CO2: 29 meq/L (ref 19–32)
Chloride: 103 mEq/L (ref 96–112)
Creatinine, Ser: 0.8 mg/dL (ref 0.4–1.2)
GFR: 79.97 mL/min (ref >60.00)
GLUCOSE: 101 mg/dL — AB (ref 70–99)
POTASSIUM: 4 meq/L (ref 3.5–5.1)
Sodium: 140 mEq/L (ref 135–145)

## 2014-01-19 NOTE — Assessment & Plan Note (Addendum)
She has lots of anxiety.  Occurs at random times through the day.    Will place another 30 day monitor.  I've encouraged her to take propranolol as needed.

## 2014-01-19 NOTE — Progress Notes (Signed)
Laura Vasquez Date of Birth  16-Jul-1946 [name]    [name], [name]  [address]    [name], [name]  [address] [phone]  Fax  [phone]  [phone]  Fax [phone]  Problem list: 1. Hypercholesterolemia 2. Fibromyalgia 3. Anxiety 4. Mild mitral regurgitation 5. Palpitations   History of Present Illness:  Laura Vasquez is a 68 year old female who I follow mostly for palpitations. She also has a history of hyperlipidemia, fibromyalgia, mild mitral regurgitation. She measures her blood pressure and heart rate on right basis. She's quite worried by the fact that these readings are somewhat variable. I've reassured her that these are normal readings.  November 15, 2012:  She is ding well.  BP is high here - is more normal at home.  Denies any chest pain.  Occasionally short of breath - not exercising regularly - looking forward to getting back to the gym.    January 19, 2014:  [name] is doing ok.  She continues to have some palpitations.  She now recognizes 2 different types of palpitations -  Past event monitors have shown sinus tach, PACs,  She has lots of anxiety issue.   She has palpitations frequently at night.  These sound like PVCs.   Has had some diverticulosis - CT scan looked ok.  She has high cholesterol.  Has not wanted to start a statin.  BP is a bit high today.  All her readings are in the normal range.  She gets anxious when she comes to see me. She drinks wine occasionally.       Current Outpatient Prescriptions on File Prior to Visit  Medication Sig Dispense Refill  . aspirin EC 81 MG tablet Take 81 mg by mouth daily.      . calcium carbonate (TUMS - DOSED IN MG ELEMENTAL CALCIUM) 500 MG chewable tablet Chew 1 tablet by mouth as needed.       . cholecalciferol (VITAMIN D) 1000 UNITS tablet Take 2,000 Units by mouth daily.       . COD LIVER OIL PO Take by mouth daily.      . cyanocobalamin  500 MCG tablet Take 500 mcg by mouth daily.      . diazepam (VALIUM) 5 MG tablet take 1/2 to 1 tablet by mouth twice a day if needed  60 tablet  5  . MAGNESIUM PO Take 250 mg by mouth.       . meclizine (ANTIVERT) 25 MG tablet Take 25 mg by mouth as needed.       . Omega-3 Fatty Acids (FISH OIL PO) Take 1 each by mouth daily.        Marland Kitchen omeprazole (PRILOSEC) 20 MG capsule Take 1 capsule (20 mg total) by mouth daily.  30 capsule  11  . propranolol (INDERAL) 10 MG tablet Take 5-10 mg by mouth 2 (two) times daily.       No current facility-administered medications on file prior to visit.    Allergies  Allergen Reactions  . Amoxicillin-Pot Clavulanate   . Amoxicillin-Pot Clavulanate   . Ceftin [Cefuroxime Axetil]     Lips burning  . Codeine   . Naproxen   . Sulfonamide Derivatives     Past Medical History  Diagnosis Date  . Anxiety   . Hypertension   . Allergy   . GERD (gastroesophageal reflux disease)   . Hyperlipidemia   .  Fibromyalgia   . Mitral regurgitation   . Palpitations   . IBS (irritable bowel syndrome)   . Vitamin B12 deficiency   . Hyperplastic colon polyp   . Peptic stricture of esophagus   . Erosive esophagitis   . Osteoarthritis   . Fatty liver   . Arrhythmia     Past Surgical History  Procedure Laterality Date  . Cholecystectomy    . Tubal ligation    . Appendectomy    . Abdominal hysterectomy      History  Smoking status  . Former Smoker  . Quit date: 08/18/1991  Smokeless tobacco  . Never Used    History  Alcohol Use No    Comment: 1-2 glasses     Family History  Problem Relation Age of Onset  . Hypertension Mother   . Ovarian cancer Mother   . Hypertension Father   . Ovarian cancer Sister   . Fibromyalgia Sister     x 2  . Colon cancer Neg Hx   . Diabetes Sister     Reviw of Systems:  Reviewed in the HPI.  All other systems are negative.  Physical Exam: Blood pressure 142/100, pulse 64, height 5\' 4"  (1.626 m), weight 178 lb  3.2 oz (80.831 kg).  BP readings are normal at home.   General: Well developed, well nourished, in no acute distress.  Head: Normocephalic, atraumatic, sclera non-icteric, mucus membranes are moist,   Neck: Supple. Carotids are 2 + without bruits. No JVD  Lungs: Clear bilaterally to auscultation.  Heart: regular rate.  normal  S1 S2. No murmurs, gallops or rubs. Has occasional premature beats.  Abdomen: Soft, non-tender, non-distended with normal bowel sounds. No hepatomegaly. No rebound/guarding. No masses.  Msk:  Strength and tone are normal  Extremities: No clubbing or cyanosis. No edema.  Distal pedal pulses are 2+ and equal bilaterally.  Neuro: Alert and oriented X 3. Moves all extremities spontaneously.  Psych:  Responds to questions appropriately with a normal affect.  ECG: January 19, 2014:  NSR at 42.  TWI in V3,V4.  Assessment / Plan:

## 2014-01-19 NOTE — Patient Instructions (Signed)
Your physician has recommended that you wear an event monitor. Event monitors are medical devices that record the heart's electrical activity. Doctors most often [place] these monitors to diagnose arrhythmias. Arrhythmias are problems with the speed or rhythm of the heartbeat. The monitor is a small, portable device. You can wear one while you do your normal daily activities. This is usually used to diagnose what is causing palpitations/syncope (passing out).  Your physician recommends that you continue on your current medications as directed. Please refer to the Current Medication list given to you today.  Your physician wants you to follow-up in: 1 year with Dr. Acie Fredrickson.  You will receive a reminder letter in the mail two months in advance. If you don't receive a letter, please call our office to schedule the follow-up appointment.

## 2014-01-19 NOTE — Assessment & Plan Note (Signed)
BP is labile.  She also gets anxious when she comes to the doctor.

## 2014-01-24 ENCOUNTER — Encounter (INDEPENDENT_AMBULATORY_CARE_PROVIDER_SITE_OTHER): Payer: Medicare Other

## 2014-01-24 ENCOUNTER — Encounter: Payer: Self-pay | Admitting: *Deleted

## 2014-01-24 DIAGNOSIS — R002 Palpitations: Secondary | ICD-10-CM

## 2014-01-24 NOTE — Progress Notes (Signed)
Patient ID: Laura Vasquez, female   DOB: 1946-08-07, 68 y.o.   MRN: [id number] E-Cardio verite 30 day cardiac event monitor applied to patient.

## 2014-03-02 ENCOUNTER — Telehealth: Payer: Self-pay | Admitting: Nurse Practitioner

## 2014-03-02 NOTE — Telephone Encounter (Signed)
I agree with trying propranolol on a regular basis.  She may try various combinations.

## 2014-03-02 NOTE — Telephone Encounter (Signed)
Spoke with patient to report eCardio monitor results per Dr. Acie Fredrickson:  NSR, sinus tachycardia, has PACs noted on other tracings.  I advised patient of no change in treatment and to continue current medications; patient verbalized understanding.  Patient states she is very symptomatic with the palpitations (PACs) and questioned the use of Propanolol; states she has been taking this medication for 2 years.  States Dr. Alain Marion advised recently that she try taking the Propanolol 5 mg BID on a regular basis. Patient wants to know what we think of this advice.  I advised patient that this would allow the medication to get into her system and potentially ward off future events.  Patient states she monitors her BP and pulse regularly.  I advised patient to continue to monitor these as she begins to take the Propanolol regularly and that I am sending message to Dr. Acie Fredrickson for his advice.  Patient states she does occasionally take 1/2 Valium to decrease anxiety and wants to make certain this is safe to continue with the regular use of the Propanolol.

## 2014-03-02 NOTE — Telephone Encounter (Signed)
Also, patient reports she called yesterday at 0830 and left a message for me to call her on her cell.  I advised patient that I never received that message.

## 2014-03-13 ENCOUNTER — Ambulatory Visit: Payer: Medicare Other | Admitting: Internal Medicine

## 2014-04-01 NOTE — Assessment & Plan Note (Signed)
Continue with current prn prescription therapy as reflected on the Med list.  

## 2014-04-01 NOTE — Progress Notes (Signed)
   Subjective:    HPI  C/o sinusitis sx's x 1 wk. F/u anxiety, palpitations, HTN. BP is nl at home. She saw Dr Acie Fredrickson. Taking Propranolol 1 a day prn: otherwise her HR is too low... BP 160/100 rechecked  BP Readings from Last 3 Encounters:  01/19/14 142/100  11/08/13 140/90  10/10/13 130/82   Wt Readings from Last 3 Encounters:  01/19/14 178 lb 3.2 oz (80.831 kg)  11/08/13 185 lb (83.915 kg)  10/10/13 184 lb 8 oz (83.689 kg)       Review of Systems  Constitutional: Negative for chills, activity change, appetite change, fatigue and unexpected weight change.  HENT: Negative for congestion, mouth sores and sinus pressure.   Eyes: Negative for visual disturbance.  Respiratory: Negative for cough and chest tightness.   Gastrointestinal: Negative for nausea and abdominal pain.  Genitourinary: Negative for frequency, difficulty urinating and vaginal pain.  Musculoskeletal: Negative for back pain and gait problem.  Skin: Negative for pallor and rash.  Neurological: Positive for dizziness (very mild now). Negative for tremors, weakness, numbness and headaches.  Psychiatric/Behavioral: Negative for suicidal ideas, confusion and sleep disturbance. The patient is nervous/anxious.        Objective:   Physical Exam  Constitutional: She appears well-developed. No distress.  HENT:  Head: Normocephalic.  Right Ear: External ear normal.  Left Ear: External ear normal.  Nose: Nose normal.  Mouth/Throat: Oropharynx is clear and moist.  Eyes: Conjunctivae are normal. Pupils are equal, round, and reactive to light. Right eye exhibits no discharge. Left eye exhibits no discharge.  Neck: Normal range of motion. Neck supple. No JVD present. No tracheal deviation present. No thyromegaly present.  Cardiovascular: Normal rate, regular rhythm and normal heart sounds.   Pulmonary/Chest: No stridor. No respiratory distress. She has no wheezes.  Abdominal: Soft. Bowel sounds are normal. She  exhibits no distension and no mass. There is no tenderness. There is no rebound and no guarding.  Musculoskeletal: She exhibits no edema and no tenderness.  Lymphadenopathy:    She has no cervical adenopathy.  Neurological: She displays normal reflexes. No cranial nerve deficit. She exhibits normal muscle tone. Coordination normal.  Skin: No rash noted. No erythema.  Psychiatric: She has a normal mood and affect. Her behavior is normal. Judgment and thought content normal.    Lab Results  Component Value Date   WBC 8.0 10/10/2013   HGB 15.6* 10/10/2013   HCT 46.8* 10/10/2013   PLT 243.0 10/10/2013   GLUCOSE 101* 01/19/2014   CHOL 272* 01/19/2014   TRIG 60.0 01/19/2014   HDL 101.90 01/19/2014   LDLDIRECT 172.9 04/03/2013   LDLCALC 158* 01/19/2014   ALT 34 01/19/2014   AST 31 01/19/2014   NA 140 01/19/2014   K 4.0 01/19/2014   CL 103 01/19/2014   CREATININE 0.8 01/19/2014   BUN 12 01/19/2014   CO2 29 01/19/2014   TSH 2.30 04/03/2013         Assessment & Plan:

## 2014-04-04 ENCOUNTER — Encounter: Payer: Self-pay | Admitting: Internal Medicine

## 2014-04-04 ENCOUNTER — Other Ambulatory Visit (INDEPENDENT_AMBULATORY_CARE_PROVIDER_SITE_OTHER): Payer: Medicare Other

## 2014-04-04 ENCOUNTER — Ambulatory Visit (INDEPENDENT_AMBULATORY_CARE_PROVIDER_SITE_OTHER): Payer: Medicare Other | Admitting: Internal Medicine

## 2014-04-04 VITALS — BP 172/100 | HR 76 | Temp 98.3°F | Resp 16 | Ht 64.0 in | Wt 181.0 lb

## 2014-04-04 DIAGNOSIS — F411 Generalized anxiety disorder: Secondary | ICD-10-CM

## 2014-04-04 DIAGNOSIS — Z0000 Encounter for general adult medical examination without abnormal findings: Secondary | ICD-10-CM

## 2014-04-04 DIAGNOSIS — R002 Palpitations: Secondary | ICD-10-CM

## 2014-04-04 DIAGNOSIS — I10 Essential (primary) hypertension: Secondary | ICD-10-CM

## 2014-04-04 DIAGNOSIS — I519 Heart disease, unspecified: Secondary | ICD-10-CM

## 2014-04-04 DIAGNOSIS — E669 Obesity, unspecified: Secondary | ICD-10-CM

## 2014-04-04 DIAGNOSIS — E538 Deficiency of other specified B group vitamins: Secondary | ICD-10-CM

## 2014-04-04 DIAGNOSIS — R000 Tachycardia, unspecified: Secondary | ICD-10-CM

## 2014-04-04 LAB — HEPATIC FUNCTION PANEL
ALBUMIN: 4.5 g/dL (ref 3.5–5.2)
ALK PHOS: 39 U/L (ref 39–117)
ALT: 29 U/L (ref 0–35)
AST: 33 U/L (ref 0–37)
BILIRUBIN TOTAL: 1.2 mg/dL (ref 0.2–1.2)
Bilirubin, Direct: 0.1 mg/dL (ref 0.0–0.3)
Total Protein: 8.2 g/dL (ref 6.0–8.3)

## 2014-04-04 LAB — BASIC METABOLIC PANEL
BUN: 9 mg/dL (ref 6–23)
CO2: 28 mEq/L (ref 19–32)
Calcium: 10.1 mg/dL (ref 8.4–10.5)
Chloride: 104 mEq/L (ref 96–112)
Creatinine, Ser: 0.8 mg/dL (ref 0.4–1.2)
GFR: 81.16 mL/min (ref >60.00)
Glucose, Bld: 102 mg/dL — ABNORMAL HIGH (ref 70–99)
POTASSIUM: 5 meq/L (ref 3.5–5.1)
SODIUM: 141 meq/L (ref 135–145)

## 2014-04-04 LAB — CBC WITH DIFFERENTIAL/PLATELET
BASOS ABS: 0 10*3/uL (ref 0.0–0.1)
Basophils Relative: 0 % (ref 0.0–3.0)
EOS ABS: 0.1 10*3/uL (ref 0.0–0.7)
Eosinophils Relative: 1.1 % (ref 0.0–5.0)
HEMATOCRIT: 45.3 % (ref 36.0–46.0)
Hemoglobin: 15.2 g/dL — ABNORMAL HIGH (ref 12.0–15.0)
LYMPHS ABS: 1.6 10*3/uL (ref 0.7–4.0)
Lymphocytes Relative: 22.5 % (ref 12.0–46.0)
MCHC: 33.6 g/dL (ref 30.0–36.0)
MCV: 90.3 fl (ref 78.0–100.0)
MONO ABS: 0.4 10*3/uL (ref 0.1–1.0)
Monocytes Relative: 5.7 % (ref 3.0–12.0)
NEUTROS PCT: 70.7 % (ref 43.0–77.0)
Neutro Abs: 5 10*3/uL (ref 1.4–7.7)
PLATELETS: 245 10*3/uL (ref 150.0–400.0)
RBC: 5.02 Mil/uL (ref 3.87–5.11)
RDW: 14.1 % (ref 11.5–15.5)
WBC: 7.1 10*3/uL (ref 4.0–10.5)

## 2014-04-04 LAB — LIPID PANEL
Cholesterol: 286 mg/dL — ABNORMAL HIGH (ref 0–200)
HDL: 96.1 mg/dL (ref >39.00)
LDL Cholesterol: 166 mg/dL — ABNORMAL HIGH (ref 0–99)
NONHDL: 189.9
Total CHOL/HDL Ratio: 3
Triglycerides: 122 mg/dL (ref 0.0–149.0)
VLDL: 24.4 mg/dL (ref 0.0–40.0)

## 2014-04-04 LAB — URINALYSIS
Bilirubin Urine: NEGATIVE
Hgb urine dipstick: NEGATIVE
Ketones, ur: NEGATIVE
Leukocytes, UA: NEGATIVE
Nitrite: NEGATIVE
PH: 7.5 (ref 5.0–8.0)
Specific Gravity, Urine: 1.01 (ref 1.000–1.030)
Total Protein, Urine: NEGATIVE
URINE GLUCOSE: NEGATIVE
UROBILINOGEN UA: 0.2 (ref 0.0–1.0)

## 2014-04-04 LAB — TSH: TSH: 1.83 u[IU]/mL (ref 0.35–4.50)

## 2014-04-04 NOTE — Assessment & Plan Note (Signed)
Continue with current prescription therapy as reflected on the Med list.  

## 2014-04-04 NOTE — Assessment & Plan Note (Signed)
Wt Readings from Last 3 Encounters:  04/04/14 181 lb (82.101 kg)  01/19/14 178 lb 3.2 oz (80.831 kg)  11/08/13 185 lb (83.915 kg)

## 2014-04-04 NOTE — Assessment & Plan Note (Signed)

## 2014-04-04 NOTE — Progress Notes (Signed)
Pre visit review using our clinic review tool, if applicable. No additional management support is needed unless otherwise documented below in the visit note. 

## 2014-04-04 NOTE — Progress Notes (Signed)
   Subjective:    HPI  The patient is here for a wellness exam. The patient has been doing well overall without major physical or psychological issues going on lately.   F/u anxiety, palpitations, HTN. BP is nl at home.   She is seeing Dr Acie Fredrickson. Taking Propranolol 1 a day prn: otherwise her HR is too low... BP 160/100 rechecked. BP is good at home.  BP Readings from Last 3 Encounters:  04/04/14 172/100  01/19/14 142/100  11/08/13 140/90   Wt Readings from Last 3 Encounters:  04/04/14 181 lb (82.101 kg)  01/19/14 178 lb 3.2 oz (80.831 kg)  11/08/13 185 lb (83.915 kg)     Review of Systems  Constitutional: Negative for chills, activity change, appetite change, fatigue and unexpected weight change.  HENT: Negative for congestion, mouth sores and sinus pressure.   Eyes: Negative for visual disturbance.  Respiratory: Negative for cough and chest tightness.   Gastrointestinal: Negative for nausea and abdominal pain.  Genitourinary: Negative for frequency, difficulty urinating and vaginal pain.  Musculoskeletal: Negative for back pain and gait problem.  Skin: Negative for pallor and rash.  Neurological: Positive for dizziness (very mild now). Negative for tremors, weakness, numbness and headaches.  Psychiatric/Behavioral: Negative for suicidal ideas, confusion and sleep disturbance. The patient is nervous/anxious.        Objective:   Physical Exam  Constitutional: She appears well-developed. No distress.  HENT:  Head: Normocephalic.  Right Ear: External ear normal.  Left Ear: External ear normal.  Nose: Nose normal.  Mouth/Throat: Oropharynx is clear and moist.  Eyes: Conjunctivae are normal. Pupils are equal, round, and reactive to light. Right eye exhibits no discharge. Left eye exhibits no discharge.  Neck: Normal range of motion. Neck supple. No JVD present. No tracheal deviation present. No thyromegaly present.  Cardiovascular: Normal rate, regular rhythm and normal  heart sounds.   Pulmonary/Chest: No stridor. No respiratory distress. She has no wheezes.  Abdominal: Soft. Bowel sounds are normal. She exhibits no distension and no mass. There is no tenderness. There is no rebound and no guarding.  Musculoskeletal: She exhibits no edema and no tenderness.  Lymphadenopathy:    She has no cervical adenopathy.  Neurological: She displays normal reflexes. No cranial nerve deficit. She exhibits normal muscle tone. Coordination normal.  Skin: No rash noted. No erythema.  Psychiatric: She has a normal mood and affect. Her behavior is normal. Judgment and thought content normal.    Lab Results  Component Value Date   WBC 8.0 10/10/2013   HGB 15.6* 10/10/2013   HCT 46.8* 10/10/2013   PLT 243.0 10/10/2013   GLUCOSE 101* 01/19/2014   CHOL 272* 01/19/2014   TRIG 60.0 01/19/2014   HDL 101.90 01/19/2014   LDLDIRECT 172.9 04/03/2013   LDLCALC 158* 01/19/2014   ALT 34 01/19/2014   AST 31 01/19/2014   NA 140 01/19/2014   K 4.0 01/19/2014   CL 103 01/19/2014   CREATININE 0.8 01/19/2014   BUN 12 01/19/2014   CO2 29 01/19/2014   TSH 2.30 04/03/2013         Assessment & Plan:

## 2014-04-04 NOTE — Assessment & Plan Note (Signed)
Recurrent ST - mild, anxiety Dr Nahser 

## 2014-04-04 NOTE — Assessment & Plan Note (Signed)
Potential benefits of a long term benzodiazepines  use as well as potential risks  and complications were explained to the patient and were aknowledged. Continue with current Diazepam prn prescription therapy as reflected on the Med list.

## 2014-04-04 NOTE — Assessment & Plan Note (Addendum)
Recurrent ST - mild, anxiety Dr Nahser 

## 2014-04-06 ENCOUNTER — Encounter: Payer: Self-pay | Admitting: *Deleted

## 2014-04-14 ENCOUNTER — Other Ambulatory Visit: Payer: Self-pay | Admitting: Internal Medicine

## 2014-04-17 ENCOUNTER — Other Ambulatory Visit: Payer: Self-pay | Admitting: Internal Medicine

## 2014-04-19 ENCOUNTER — Other Ambulatory Visit: Payer: Self-pay | Admitting: *Deleted

## 2014-04-19 MED ORDER — OMEPRAZOLE 20 MG PO CPDR: DELAYED_RELEASE_CAPSULE | ORAL | Status: DC

## 2014-04-19 NOTE — Telephone Encounter (Signed)
Pt left msg on triage requesting call back concerning her prescription. Called pt back she states its a big mess up with her rx omeprazole. [facility] keep telling her that md denied the omeprazole. Verified chart inform pt that md had received 2 electronic request 1 was approved & 1 was denied. Pt is wanting to come to the office to pick up a hard copy to take to the pharmacy. She states [facility] never have the same pharmacist on duty & every time she call she speak with some one different. Inform pt will print script & leave for pick-up...[name]

## 2014-04-30 ENCOUNTER — Other Ambulatory Visit: Payer: Self-pay | Admitting: Gynecology

## 2014-04-30 DIAGNOSIS — M8580 Other specified disorders of bone density and structure, unspecified site: Secondary | ICD-10-CM

## 2014-05-02 ENCOUNTER — Other Ambulatory Visit: Payer: Self-pay | Admitting: Internal Medicine

## 2014-06-21 ENCOUNTER — Ambulatory Visit: Payer: Medicare Other

## 2014-06-26 ENCOUNTER — Ambulatory Visit: Payer: Medicare Other

## 2014-06-26 ENCOUNTER — Ambulatory Visit (INDEPENDENT_AMBULATORY_CARE_PROVIDER_SITE_OTHER): Payer: Medicare Other

## 2014-06-26 DIAGNOSIS — Z23 Encounter for immunization: Secondary | ICD-10-CM

## 2014-07-08 ENCOUNTER — Other Ambulatory Visit: Payer: Self-pay | Admitting: Internal Medicine

## 2014-07-16 MED ORDER — DIAZEPAM 5 MG PO TABS: 2.5000 mg | ORAL_TABLET | Freq: Two times a day (BID) | ORAL | Status: DC | PRN

## 2014-07-16 NOTE — Addendum Note (Signed)
Addended by: Lowella Dandy on: 07/16/2014 12:01 PM   Modules accepted: Orders

## 2014-07-31 ENCOUNTER — Telehealth: Payer: Self-pay | Admitting: *Deleted

## 2014-07-31 NOTE — Telephone Encounter (Signed)
Pt has been summonsed to appear on a jury on 09/13/14. She c/o diverticulosis flare ups, elevated BP and irregular HR. She is bringing jury summons and requesting an excuse letter.

## 2014-07-31 NOTE — Telephone Encounter (Signed)
pls write a letter - need juror # Thx

## 2014-08-01 NOTE — Telephone Encounter (Signed)
Pt brought juror # generated letter. Gave to pt.Laura KitchenJohny Chess

## 2014-08-01 NOTE — Telephone Encounter (Signed)
Called pt inform her need juror # pt is on her route here will complete letter once she get here...Laura Vasquez

## 2014-08-17 IMAGING — CT CT ABD-PELV W/ CM
2 of 4 series · 16 of 46 positions shown, 18 images · IV contrast (Omnipaque 300)
Comparison: None available

CLINICAL DATA: Lower abdominal pain and nausea.

EXAM:
CT ABDOMEN AND PELVIS WITH CONTRAST
TECHNIQUE: Multidetector CT imaging of the abdomen and pelvis was performed
using the standard protocol following bolus administration of
intravenous contrast.
CONTRAST:  100mL OMNIPAQUE IOHEXOL 300 MG/ML  SOLN

[Series 2: abd/ pel 5mm · axial · 0.73mm/px · z∈[-440,-20]mm · 13 of 94 slices shown, 15 images]
[im 5/94  soft-tissue]
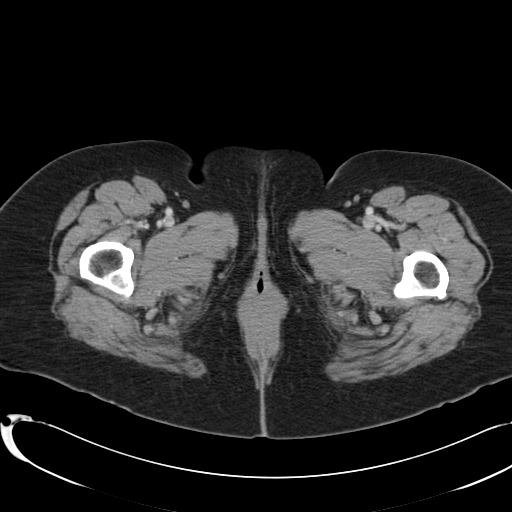
[im 5/94  bone]
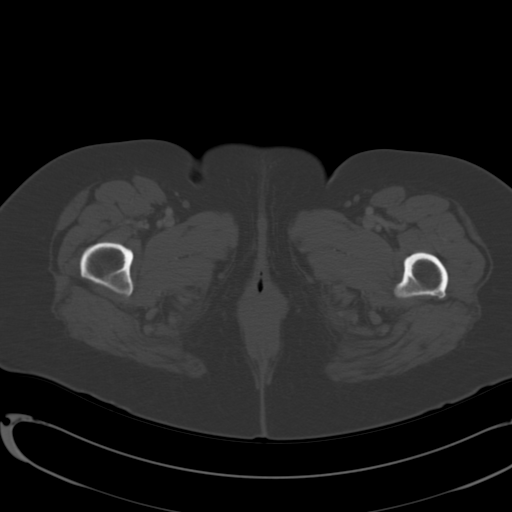
[im 14/94  soft-tissue]
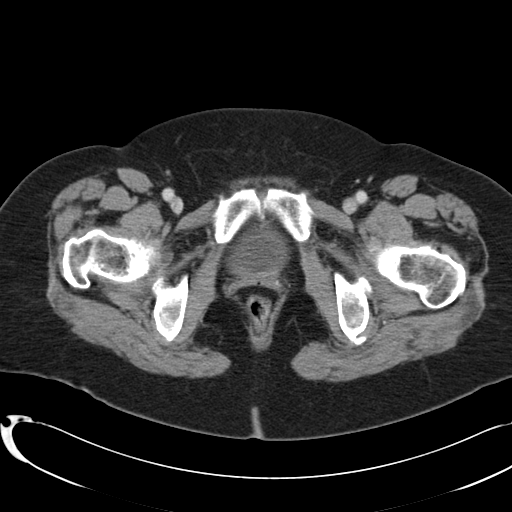
[im 18/94  soft-tissue]
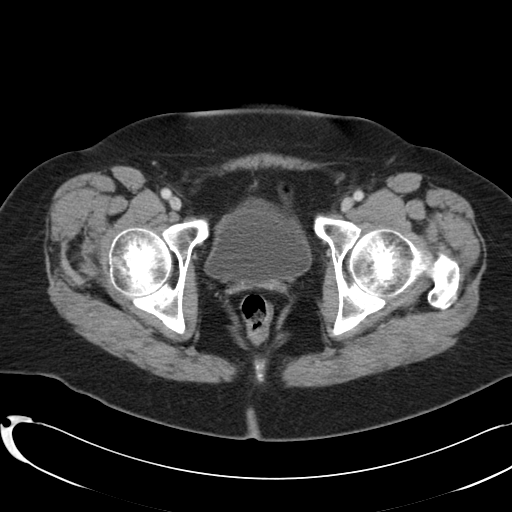
[im 27/94  soft-tissue]
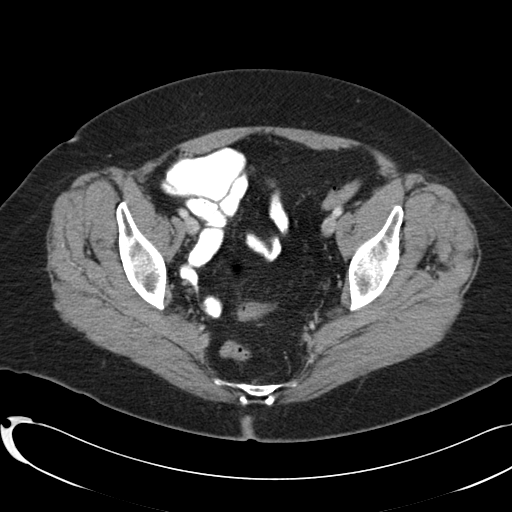
[im 32/94  soft-tissue]
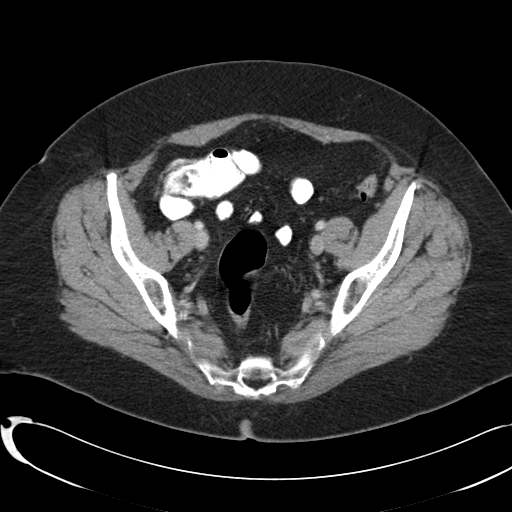
[im 40/94  soft-tissue]
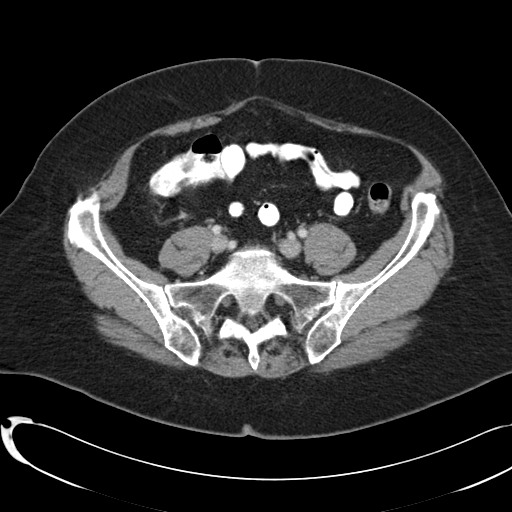
[im 49/94  soft-tissue]
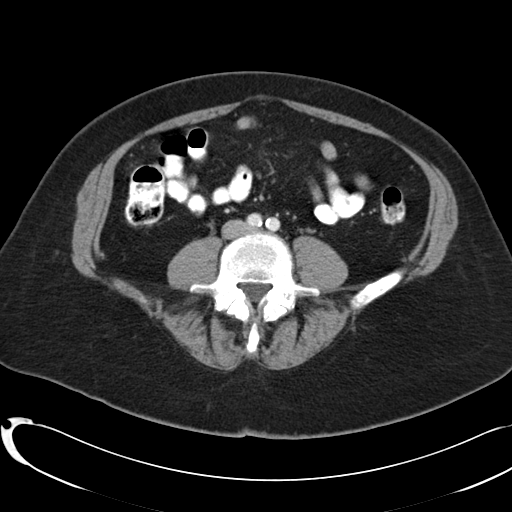
[im 54/94  soft-tissue]
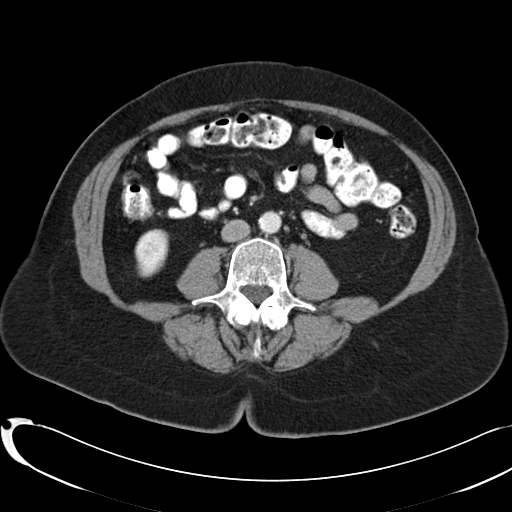
[im 63/94  soft-tissue]
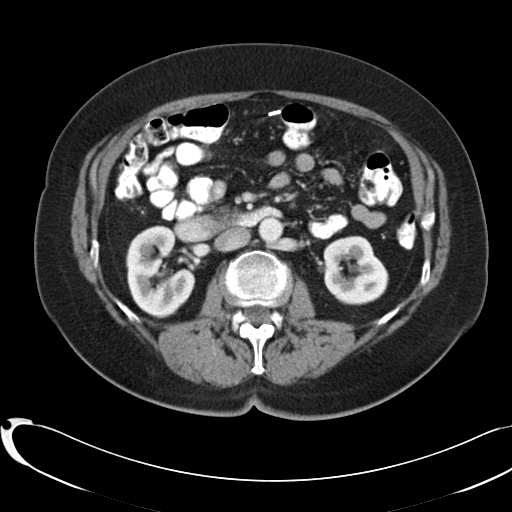
[im 63/94  bone]
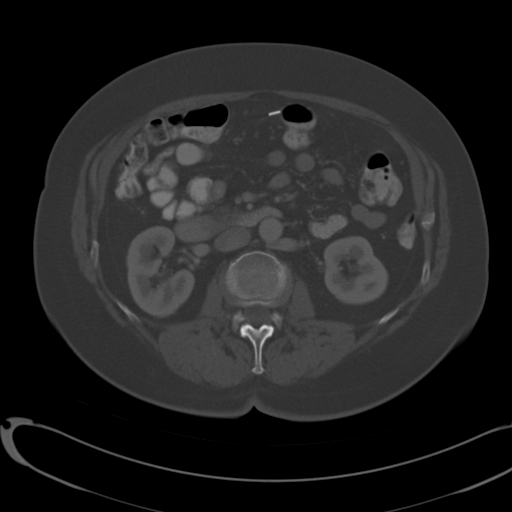
[im 67/94  soft-tissue]
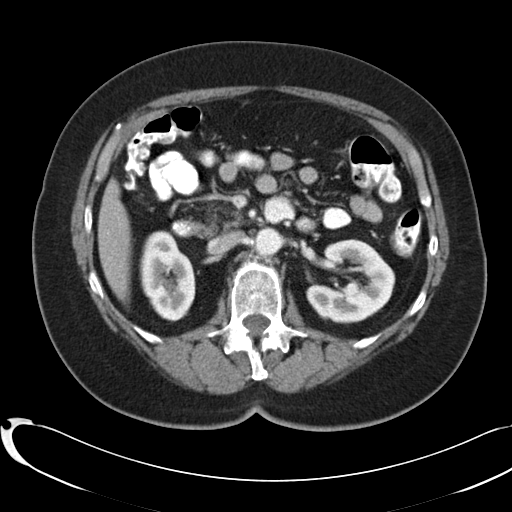
[im 76/94  soft-tissue]
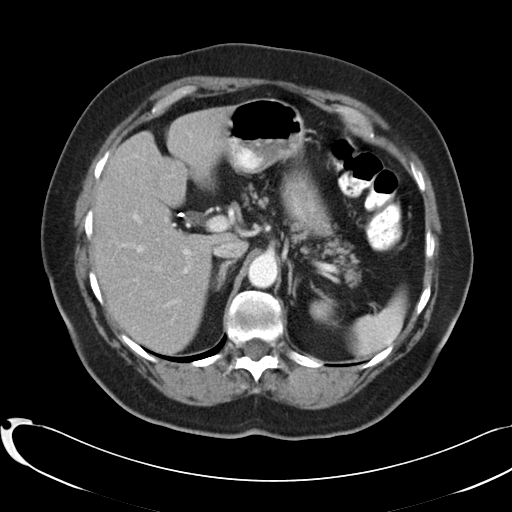
[im 80/94  soft-tissue]
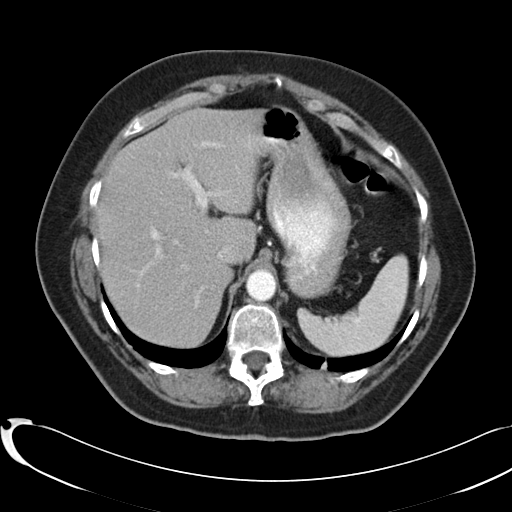
[im 89/94  soft-tissue]
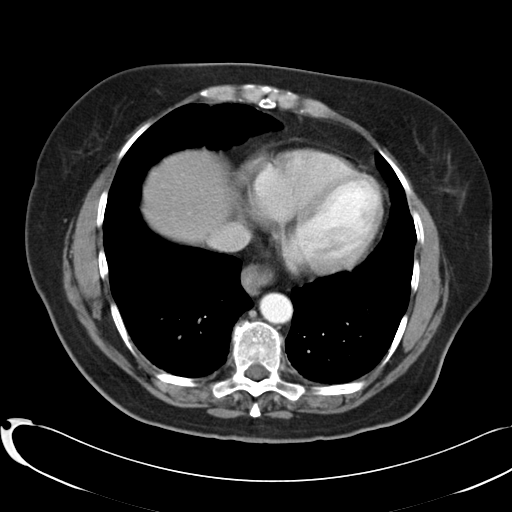

[Series 602: <mpr range> · coronal · 0.94mm/px · 3 of 125 slices shown]
[im 42/125  soft-tissue]
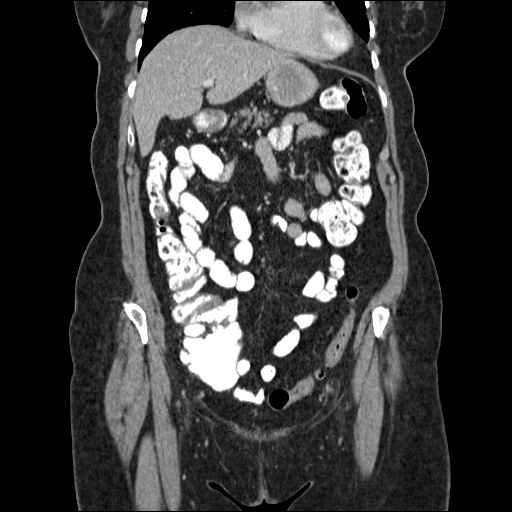
[im 56/125  soft-tissue]
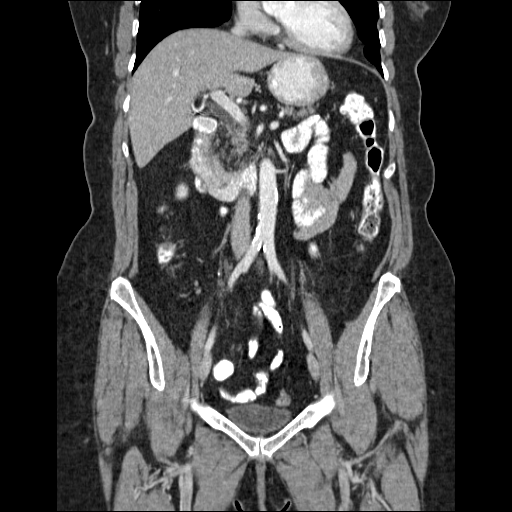
[im 69/125  soft-tissue]
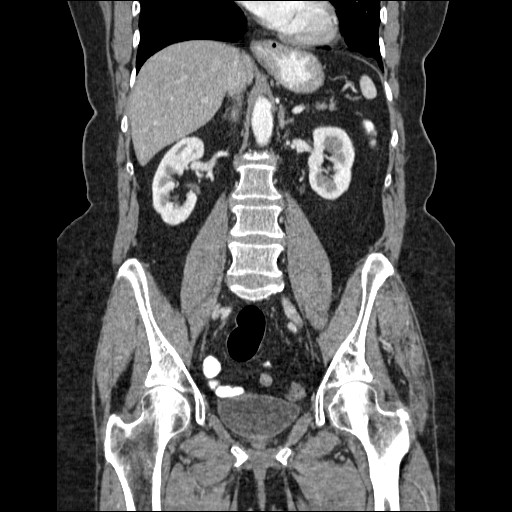

[16 of 46 positions shown; findings below may reference images not displayed]

FINDINGS: The lung bases are clear. Left basilar scarring changes are noted.
The heart is normal in size. No pericardial effusion. There is a
moderate-sized hiatal hernia.

The liver is unremarkable except for mild diffuse fatty
infiltration. No focal hepatic lesions or intrahepatic biliary
dilatation. The gallbladder is surgically absent. Mild associated
common bile duct dilatation. The pancreas demonstrates fatty changes
but no inflammation or mass. The spleen is normal in size. No focal
lesions. The adrenal glands and kidneys are unremarkable.

The stomach, duodenum, small bowel and colon are unremarkable. No
inflammatory changes, mass lesions or obstructive findings. Moderate
sigmoid diverticulosis. No mesenteric or retroperitoneal mass or
adenopathy. Small scattered mesenteric and retroperitoneal lymph
nodes but no mass or adenopathy. The aorta is normal in caliber.
Scattered atherosclerotic calcifications. The branch vessels are
patent. The major venous structures are patent. A circumaortic left
renal vein is noted incidentally.

The uterus is surgically absent. The ovaries are not identified. No
pelvic mass, adenopathy or free pelvic fluid collections. The
bladder is normal. No inguinal mass or adenopathy.

The bony structures are unremarkable. No lytic or sclerotic bone
lesions.
IMPRESSION: Unremarkable abdominal/pelvic CT scan. No acute abdominal/ pelvic
findings, mass lesions or adenopathy.

Mild diffuse fatty infiltration of the liver.

Moderate-sized hiatal hernia.

Status postcholecystectomy with mild associated common bile duct
dilatation.

Status post hysterectomy.

## 2014-08-21 ENCOUNTER — Ambulatory Visit
Admission: RE | Admit: 2014-08-21 | Discharge: 2014-08-21 | Disposition: A | Discharge status: Home / Self Care | Payer: Medicare Other | Source: Ambulatory Visit | Attending: Gynecology | Admitting: Gynecology

## 2014-08-21 DIAGNOSIS — M8580 Other specified disorders of bone density and structure, unspecified site: Secondary | ICD-10-CM

## 2014-09-20 ENCOUNTER — Other Ambulatory Visit: Payer: Self-pay

## 2014-09-20 DIAGNOSIS — Z1231 Encounter for screening mammogram for malignant neoplasm of breast: Secondary | ICD-10-CM

## 2014-10-01 ENCOUNTER — Ambulatory Visit: Payer: Medicare Other

## 2014-10-08 ENCOUNTER — Ambulatory Visit
Admission: RE | Admit: 2014-10-08 | Discharge: 2014-10-08 | Disposition: A | Discharge status: Home / Self Care | Payer: Medicare Other | Source: Ambulatory Visit

## 2014-10-08 DIAGNOSIS — Z1231 Encounter for screening mammogram for malignant neoplasm of breast: Secondary | ICD-10-CM

## 2014-10-10 ENCOUNTER — Encounter: Payer: Self-pay | Admitting: Internal Medicine

## 2014-10-10 ENCOUNTER — Ambulatory Visit (INDEPENDENT_AMBULATORY_CARE_PROVIDER_SITE_OTHER): Payer: Medicare Other | Admitting: Internal Medicine

## 2014-10-10 VITALS — BP 152/110 | HR 96 | Temp 98.1°F | Resp 14 | Ht 64.0 in | Wt 185.2 lb

## 2014-10-10 DIAGNOSIS — J069 Acute upper respiratory infection, unspecified: Secondary | ICD-10-CM

## 2014-10-10 MED ORDER — DOXYCYCLINE HYCLATE 100 MG PO TABS: 100.0000 mg | ORAL_TABLET | Freq: Two times a day (BID) | ORAL | Status: DC

## 2014-10-10 NOTE — Progress Notes (Signed)
   Subjective:    Patient ID: Laura Vasquez, female    DOB: [birth year], 69 y.o.   MRN: [id number]  HPI  Her symptoms began 10/06/14 as nonproductive cough and copious rhinitis. She also had sneezing and some itchy, watery eyes.  She's had intermittent pain in the frontal and maxillary sinus areas as well as pain in the teeth.  She's been using a Neti pot with some improvement  She does see an ENT doctor; there is a question of diminutive otic canals.  She quit smoking in 1993. Flu shot is up-to-date.  Review of Systems No fever, chills, or sweats. She has been rushing this am;this typically does raise her blood pressure.BP @ home averages 129/65.    Objective:   Physical Exam  Pertinent or positive findings include: She appears much younger than her stated age. There is erythema of the right nare greater than the left.  General appearance:Adequately nourished; no acute distress or increased work of breathing is present.  No  lymphadenopathy about the head, neck, or axilla noted.  Eyes: No conjunctival inflammation or lid edema is present. There is no scleral icterus. Ears:  External ear exam shows no significant lesions or deformities.  Otoscopic examination reveals clear canals, tympanic membranes are intact bilaterally without bulging, retraction, inflammation or discharge. Nose:  External nasal examination shows no deformity or inflammation. No septal dislocation or deviation.No obstruction to airflow.  Oral exam: Dental hygiene is good; lips and gums are healthy appearing.There is no oropharyngeal erythema or exudate noted.  Neck:  No deformities, thyromegaly, masses, or tenderness noted.   Supple with full range of motion without pain.  Heart:  Normal rate and regular rhythm. S1 and S2 normal without gallop, murmur, click, rub or other extra sounds.  Lungs:Chest clear to auscultation; no wheezes, rhonchi,rales ,or rubs present. Extremities:  No cyanosis, edema, or clubbing  noted   Skin: Warm & dry w/o jaundice or tenting.       Assessment & Plan:  #1 upper respiratory tract infection with rhinitis; possible allergic component  Plan: See orders and recommendations

## 2014-10-10 NOTE — Progress Notes (Signed)
Pre visit review using our clinic review tool, if applicable. No additional management support is needed unless otherwise documented below in the visit note. 

## 2014-10-10 NOTE — Patient Instructions (Addendum)
Plain Mucinex (NOT D) for thick secretions ;force NON dairy fluids .   Nasal cleansing in the shower as discussed with lather of mild shampoo.After 10 seconds wash off lather while  exhaling through nostrils. Make sure that all residual soap is removed to prevent irritation.  Flonase OR Nasacort AQ 1 spray in each nostril twice a day as needed. Use the "crossover" technique into opposite nostril spraying toward opposite ear @ 45 degree angle, not straight up into nostril.  Plain Allegra (NOT D )  160 daily , Loratidine 10 mg , OR Zyrtec 10 mg @ bedtime  as needed for itchy eyes & sneezing.  Zicam Melts or Zinc lozenges as per package label for sore throat .  Complementary options to boost immunity include  vitamin C 2000 mg daily; & Echinacea for 4-7 days.  Fill the  prescription for antibiotic if fever, discolored nasal or chest secretions or significant pain above & below eyes appear in the next 48-72 hours.  Minimal Blood Pressure Goal= AVERAGE < 140/90;  Ideal is an AVERAGE < 135/85. This AVERAGE should be calculated from @ least 5-7 BP readings taken @ different times of day on different days of week. You should not respond to isolated BP readings , but rather the AVERAGE for that week .Please bring your  blood pressure cuff to office visits to verify that it is reliable.It  can also be checked against the blood pressure device at the pharmacy. Finger or wrist cuffs are not dependable; an arm cuff is.

## 2014-11-22 ENCOUNTER — Telehealth: Payer: Self-pay | Admitting: Internal Medicine

## 2014-11-29 ENCOUNTER — Telehealth: Payer: Self-pay | Admitting: Internal Medicine

## 2014-11-29 NOTE — Telephone Encounter (Signed)
Pt called in about a PA on her omeprazole (PRILOSEC) 20 MG capsule [[id number]], She wants to know the status of it and if you received it?     Best number ----[phone]

## 2014-12-03 ENCOUNTER — Other Ambulatory Visit: Payer: Self-pay | Admitting: Internal Medicine

## 2014-12-05 NOTE — Telephone Encounter (Signed)
I tried to initiate PA via cover my meds. I called pt to get her updated PA info. She states the Omeprazole PA was approved. I advised her to check with pharmacy and see if her Rx is ready for p/u.  I advised her to call us back if anything further is needed.

## 2015-03-25 ENCOUNTER — Telehealth: Payer: Self-pay

## 2015-03-25 NOTE — Telephone Encounter (Signed)
Spoke to pt about scheduling AWV with [facility];   Pt is not interested b/c during her last visit (10/10/2014) she was asked all of the AWV questions. Pt was upset that those questions were asked during that visit since the visit was for sinusitis.

## 2015-03-26 ENCOUNTER — Ambulatory Visit (INDEPENDENT_AMBULATORY_CARE_PROVIDER_SITE_OTHER): Payer: Medicare Other | Admitting: Internal Medicine

## 2015-03-26 ENCOUNTER — Encounter: Payer: Self-pay | Admitting: Internal Medicine

## 2015-03-26 VITALS — BP 170/82 | HR 73 | Wt 181.0 lb

## 2015-03-26 DIAGNOSIS — R42 Dizziness and giddiness: Secondary | ICD-10-CM | POA: Diagnosis not present

## 2015-03-26 DIAGNOSIS — E785 Hyperlipidemia, unspecified: Secondary | ICD-10-CM

## 2015-03-26 DIAGNOSIS — E559 Vitamin D deficiency, unspecified: Secondary | ICD-10-CM | POA: Diagnosis not present

## 2015-03-26 DIAGNOSIS — I10 Essential (primary) hypertension: Secondary | ICD-10-CM | POA: Diagnosis not present

## 2015-03-26 DIAGNOSIS — E538 Deficiency of other specified B group vitamins: Secondary | ICD-10-CM | POA: Diagnosis not present

## 2015-03-26 DIAGNOSIS — Z0000 Encounter for general adult medical examination without abnormal findings: Secondary | ICD-10-CM

## 2015-03-26 NOTE — Assessment & Plan Note (Signed)
Benign Positional Vertigo symptoms  Laura Vasquez - Daroff exercise several times a day as dirrected.

## 2015-03-26 NOTE — Assessment & Plan Note (Signed)
Chronic  BP is nl at home. SBP at home is 100-170, mostly WNL Pt declined additional Rx On Amlodipine

## 2015-03-26 NOTE — Assessment & Plan Note (Signed)
On Vit B12 

## 2015-03-26 NOTE — Progress Notes (Signed)
Pre visit review using our clinic review tool, if applicable. No additional management support is needed unless otherwise documented below in the visit note. 

## 2015-03-26 NOTE — Progress Notes (Signed)
Subjective:  Patient ID: Laura Vasquez, female    DOB: [birth year]  Age: 69 y.o. MRN: [id number]  CC: No chief complaint on file.   HPI Laura Vasquez [facility] presents for c/o vertigo at times - off and on x years (h/o BPV). C/o nausea w/vertigo. C/o elev BP at home. C/o B hip pain and feet pain. SBP at home is 100-170, mostly WNL  Outpatient Prescriptions Prior to Visit  Medication Sig Dispense Refill  . aspirin EC 81 MG tablet Take 81 mg by mouth daily.    . calcium carbonate (TUMS - DOSED IN MG ELEMENTAL CALCIUM) 500 MG chewable tablet Chew 1 tablet by mouth as needed.     . cholecalciferol (VITAMIN D) 1000 UNITS tablet Take 2,000 Units by mouth daily.     . COD LIVER OIL PO Take by mouth daily.    . cyanocobalamin 500 MCG tablet Take 500 mcg by mouth daily.    . diazepam (VALIUM) 5 MG tablet Take 0.5-1 tablets (2.5-5 mg total) by mouth every 12 (twelve) hours as needed for anxiety. 60 tablet 5  . MAGNESIUM PO Take 400 mg by mouth.     . meclizine (ANTIVERT) 25 MG tablet Take 25 mg by mouth as needed.     . Omega-3 Fatty Acids (FISH OIL PO) Take 1-2 each by mouth daily.     Marland Kitchen omeprazole (PRILOSEC) 20 MG capsule take 1 capsule by mouth twice a day (Patient taking differently: take 1 capsule by mouth once daily) 60 capsule 5  . propranolol (INDERAL) 10 MG tablet Take 10 mg by mouth daily.     Marland Kitchen doxycycline (VIBRA-TABS) 100 MG tablet Take 1 tablet (100 mg total) by mouth 2 (two) times daily. 20 tablet 0   No facility-administered medications prior to visit.    ROS Review of Systems  Constitutional: Negative for chills, activity change, appetite change, fatigue and unexpected weight change.  HENT: Negative for congestion, mouth sores and sinus pressure.   Eyes: Negative for visual disturbance.  Respiratory: Negative for cough and chest tightness.   Gastrointestinal: Negative for nausea and abdominal pain.  Genitourinary: Negative for frequency, difficulty urinating and vaginal pain.    Musculoskeletal: Positive for arthralgias. Negative for back pain and gait problem.  Skin: Negative for pallor and rash.  Neurological: Negative for dizziness, tremors, weakness, numbness and headaches.  Psychiatric/Behavioral: Negative for confusion and sleep disturbance. The patient is nervous/anxious.   obese  Objective:  BP 170/82 mmHg  Pulse 73  Wt 181 lb (82.101 kg)  SpO2 96%  BP Readings from Last 3 Encounters:  03/26/15 170/82  10/10/14 152/110  04/04/14 172/100    Wt Readings from Last 3 Encounters:  03/26/15 181 lb (82.101 kg)  10/10/14 185 lb 4 oz (84.029 kg)  04/04/14 181 lb (82.101 kg)    Physical Exam  Constitutional: She appears well-developed. No distress.  HENT:  Head: Normocephalic.  Right Ear: External ear normal.  Left Ear: External ear normal.  Nose: Nose normal.  Mouth/Throat: Oropharynx is clear and moist.  Eyes: Conjunctivae are normal. Pupils are equal, round, and reactive to light. Right eye exhibits no discharge. Left eye exhibits no discharge.  Neck: Normal range of motion. Neck supple. No JVD present. No tracheal deviation present. No thyromegaly present.  Cardiovascular: Normal rate, regular rhythm and normal heart sounds.   Pulmonary/Chest: No stridor. No respiratory distress. She has no wheezes.  Abdominal: Soft. Bowel sounds are normal. She exhibits no distension and no mass.  There is no tenderness. There is no rebound and no guarding.  Musculoskeletal: She exhibits tenderness. She exhibits no edema.  Lymphadenopathy:    She has no cervical adenopathy.  Neurological: She displays normal reflexes. No cranial nerve deficit. She exhibits normal muscle tone. Coordination normal.  Skin: No rash noted. No erythema.  Psychiatric: She has a normal mood and affect. Her behavior is normal. Judgment and thought content normal.  thickened oval places on arches B feet  Lab Results  Component Value Date   WBC 7.1 04/04/2014   HGB 15.2* 04/04/2014    HCT 45.3 04/04/2014   PLT 245.0 04/04/2014   GLUCOSE 102* 04/04/2014   CHOL 286* 04/04/2014   TRIG 122.0 04/04/2014   HDL 96.10 04/04/2014   LDLDIRECT 172.9 04/03/2013   LDLCALC 166* 04/04/2014   ALT 29 04/04/2014   AST 33 04/04/2014   NA 141 04/04/2014   K 5.0 04/04/2014   CL 104 04/04/2014   CREATININE 0.8 04/04/2014   BUN 9 04/04/2014   CO2 28 04/04/2014   TSH 1.83 04/04/2014    Mm Digital Screening Bilateral  10/09/2014   CLINICAL DATA:  Screening.  EXAM: DIGITAL SCREENING BILATERAL MAMMOGRAM WITH CAD  COMPARISON:  Previous exam(s).  ACR Breast Density Category b: There are scattered areas of fibroglandular density.  FINDINGS: There are no findings suspicious for malignancy. Images were processed with CAD.  IMPRESSION: No mammographic evidence of malignancy. A result letter of this screening mammogram will be mailed directly to the patient.  RECOMMENDATION: Screening mammogram in one year. (Code:SM-B-01Y)  BI-RADS CATEGORY  1: Negative.   Electronically Signed   By: Nolon Nations M.D.   On: 10/09/2014 15:16    Assessment & Plan:   Diagnoses and all orders for this visit:  Essential hypertension Orders: -     Basic metabolic panel; Future -     CBC with Differential/Platelet; Future -     Hepatic function panel; Future -     Lipid panel; Future -     TSH; Future -     Vitamin B12; Future -     Urinalysis; Future -     Vit D  25 hydroxy (rtn osteoporosis monitoring); Future -     Sedimentation rate; Future  B12 deficiency Orders: -     Basic metabolic panel; Future -     CBC with Differential/Platelet; Future -     Hepatic function panel; Future -     Lipid panel; Future -     TSH; Future -     Vitamin B12; Future -     Urinalysis; Future -     Vit D  25 hydroxy (rtn osteoporosis monitoring); Future -     Sedimentation rate; Future  Vertigo Orders: -     Basic metabolic panel; Future -     CBC with Differential/Platelet; Future -     Hepatic function  panel; Future -     Lipid panel; Future -     TSH; Future -     Vitamin B12; Future -     Urinalysis; Future -     Vit D  25 hydroxy (rtn osteoporosis monitoring); Future -     Sedimentation rate; Future  Vitamin D deficiency Orders: -     Basic metabolic panel; Future -     CBC with Differential/Platelet; Future -     Hepatic function panel; Future -     Lipid panel; Future -     TSH; Future -  Vitamin B12; Future -     Urinalysis; Future -     Vit D  25 hydroxy (rtn osteoporosis monitoring); Future -     Sedimentation rate; Future  Well adult exam Orders: -     Basic metabolic panel; Future -     CBC with Differential/Platelet; Future -     Hepatic function panel; Future -     Lipid panel; Future -     TSH; Future -     Vitamin B12; Future -     Urinalysis; Future -     Vit D  25 hydroxy (rtn osteoporosis monitoring); Future -     Sedimentation rate; Future  Dyslipidemia Orders: -     Basic metabolic panel; Future -     CBC with Differential/Platelet; Future -     Hepatic function panel; Future -     Lipid panel; Future -     TSH; Future -     Vitamin B12; Future -     Urinalysis; Future -     Vit D  25 hydroxy (rtn osteoporosis monitoring); Future -     Sedimentation rate; Future  I have discontinued Laura Vasquez's doxycycline. I am also having her maintain her Omega-3 Fatty Acids (FISH OIL PO), cholecalciferol, MAGNESIUM PO, calcium carbonate, aspirin EC, meclizine, cyanocobalamin, propranolol, COD LIVER OIL PO, diazepam, omeprazole, and B Complex Vitamins (B COMPLEX PO).  Meds ordered this encounter  Medications  . B Complex Vitamins (B COMPLEX PO)    Sig: Take 1 tablet by mouth daily.     Follow-up: No Follow-up on file.  Walker Kehr, MD

## 2015-04-05 ENCOUNTER — Other Ambulatory Visit (INDEPENDENT_AMBULATORY_CARE_PROVIDER_SITE_OTHER): Payer: Medicare Other

## 2015-04-05 DIAGNOSIS — E559 Vitamin D deficiency, unspecified: Secondary | ICD-10-CM | POA: Diagnosis not present

## 2015-04-05 DIAGNOSIS — I10 Essential (primary) hypertension: Secondary | ICD-10-CM

## 2015-04-05 DIAGNOSIS — R42 Dizziness and giddiness: Secondary | ICD-10-CM

## 2015-04-05 DIAGNOSIS — E538 Deficiency of other specified B group vitamins: Secondary | ICD-10-CM | POA: Diagnosis not present

## 2015-04-05 DIAGNOSIS — Z0000 Encounter for general adult medical examination without abnormal findings: Secondary | ICD-10-CM

## 2015-04-05 DIAGNOSIS — E785 Hyperlipidemia, unspecified: Secondary | ICD-10-CM

## 2015-04-05 LAB — URINALYSIS, ROUTINE W REFLEX MICROSCOPIC
Bilirubin Urine: NEGATIVE
Hgb urine dipstick: NEGATIVE
Ketones, ur: NEGATIVE
NITRITE: NEGATIVE
PH: 8.5 — AB (ref 5.0–8.0)
Specific Gravity, Urine: 1.01 (ref 1.000–1.030)
TOTAL PROTEIN, URINE-UPE24: NEGATIVE
Urine Glucose: NEGATIVE
Urobilinogen, UA: 0.2 (ref 0.0–1.0)

## 2015-04-05 LAB — HEPATIC FUNCTION PANEL
ALBUMIN: 4.5 g/dL (ref 3.5–5.2)
ALT: 27 U/L (ref 0–35)
AST: 28 U/L (ref 0–37)
Alkaline Phosphatase: 39 U/L (ref 39–117)
Bilirubin, Direct: 0.2 mg/dL (ref 0.0–0.3)
Total Bilirubin: 0.8 mg/dL (ref 0.2–1.2)
Total Protein: 7.6 g/dL (ref 6.0–8.3)

## 2015-04-05 LAB — CBC WITH DIFFERENTIAL/PLATELET
Basophils Absolute: 0 10*3/uL (ref 0.0–0.1)
Basophils Relative: 0.3 % (ref 0.0–3.0)
EOS PCT: 1.7 % (ref 0.0–5.0)
Eosinophils Absolute: 0.1 10*3/uL (ref 0.0–0.7)
HCT: 45.3 % (ref 36.0–46.0)
Hemoglobin: 15.2 g/dL — ABNORMAL HIGH (ref 12.0–15.0)
LYMPHS ABS: 1.9 10*3/uL (ref 0.7–4.0)
Lymphocytes Relative: 38.3 % (ref 12.0–46.0)
MCHC: 33.5 g/dL (ref 30.0–36.0)
MCV: 90.8 fl (ref 78.0–100.0)
MONOS PCT: 8.4 % (ref 3.0–12.0)
Monocytes Absolute: 0.4 10*3/uL (ref 0.1–1.0)
NEUTROS ABS: 2.6 10*3/uL (ref 1.4–7.7)
Neutrophils Relative %: 51.3 % (ref 43.0–77.0)
PLATELETS: 227 10*3/uL (ref 150.0–400.0)
RBC: 4.99 Mil/uL (ref 3.87–5.11)
RDW: 14.1 % (ref 11.5–15.5)
WBC: 5 10*3/uL (ref 4.0–10.5)

## 2015-04-05 LAB — LIPID PANEL
Cholesterol: 261 mg/dL — ABNORMAL HIGH (ref 0–200)
HDL: 92.3 mg/dL (ref >39.00)
LDL Cholesterol: 155 mg/dL — ABNORMAL HIGH (ref 0–99)
NonHDL: 168.34
TRIGLYCERIDES: 68 mg/dL (ref 0.0–149.0)
Total CHOL/HDL Ratio: 3
VLDL: 13.6 mg/dL (ref 0.0–40.0)

## 2015-04-05 LAB — TSH: TSH: 2.68 u[IU]/mL (ref 0.35–4.50)

## 2015-04-05 LAB — BASIC METABOLIC PANEL
BUN: 13 mg/dL (ref 6–23)
CALCIUM: 9.7 mg/dL (ref 8.4–10.5)
CO2: 28 meq/L (ref 19–32)
Chloride: 104 mEq/L (ref 96–112)
Creatinine, Ser: 0.84 mg/dL (ref 0.40–1.20)
GFR: 71 mL/min (ref >60.00)
Glucose, Bld: 96 mg/dL (ref 70–99)
POTASSIUM: 4.6 meq/L (ref 3.5–5.1)
Sodium: 140 mEq/L (ref 135–145)

## 2015-04-05 LAB — VITAMIN D 25 HYDROXY (VIT D DEFICIENCY, FRACTURES): VITD: 48.99 ng/mL (ref 30.00–100.00)

## 2015-04-05 LAB — VITAMIN B12: Vitamin B-12: 858 pg/mL (ref 211–911)

## 2015-04-05 LAB — SEDIMENTATION RATE: SED RATE: 9 mm/h (ref 0–22)

## 2015-04-10 ENCOUNTER — Ambulatory Visit (INDEPENDENT_AMBULATORY_CARE_PROVIDER_SITE_OTHER): Payer: Medicare Other | Admitting: Internal Medicine

## 2015-04-10 ENCOUNTER — Encounter: Payer: Self-pay | Admitting: Internal Medicine

## 2015-04-10 VITALS — BP 160/90 | HR 72 | Ht 64.0 in | Wt 180.0 lb

## 2015-04-10 DIAGNOSIS — D582 Other hemoglobinopathies: Secondary | ICD-10-CM

## 2015-04-10 DIAGNOSIS — R000 Tachycardia, unspecified: Secondary | ICD-10-CM | POA: Diagnosis not present

## 2015-04-10 DIAGNOSIS — Z0000 Encounter for general adult medical examination without abnormal findings: Secondary | ICD-10-CM | POA: Diagnosis not present

## 2015-04-10 NOTE — Progress Notes (Signed)
Pre visit review using our clinic review tool, if applicable. No additional management support is needed unless otherwise documented below in the visit note. 

## 2015-04-10 NOTE — Assessment & Plan Note (Signed)
Mild Denies OSA

## 2015-04-10 NOTE — Progress Notes (Signed)
Subjective:  Patient ID: Laura Vasquez, female    DOB: [birth year]  Age: 69 y.o. MRN: [id number]  CC: No chief complaint on file.   HPI Laura Vasquez [facility] presents for a well exam. C/o stress  Outpatient Prescriptions Prior to Visit  Medication Sig Dispense Refill  . aspirin EC 81 MG tablet Take 81 mg by mouth daily.    . B Complex Vitamins (B COMPLEX PO) Take 1 tablet by mouth daily.    . calcium carbonate (TUMS - DOSED IN MG ELEMENTAL CALCIUM) 500 MG chewable tablet Chew 1 tablet by mouth as needed.     . cholecalciferol (VITAMIN D) 1000 UNITS tablet Take 2,000 Units by mouth daily.     . COD LIVER OIL PO Take by mouth daily.    . cyanocobalamin 500 MCG tablet Take 500 mcg by mouth daily.    . diazepam (VALIUM) 5 MG tablet Take 0.5-1 tablets (2.5-5 mg total) by mouth every 12 (twelve) hours as needed for anxiety. 60 tablet 5  . MAGNESIUM PO Take 800 mg by mouth.     . meclizine (ANTIVERT) 25 MG tablet Take 25 mg by mouth as needed.     . Omega-3 Fatty Acids (FISH OIL PO) Take 1-2 each by mouth daily.     Marland Kitchen omeprazole (PRILOSEC) 20 MG capsule take 1 capsule by mouth twice a day (Patient taking differently: take 1 capsule by mouth once daily) 60 capsule 5  . propranolol (INDERAL) 10 MG tablet Take 10 mg by mouth daily.      No facility-administered medications prior to visit.    ROS Review of Systems  Constitutional: Negative for chills, activity change, appetite change, fatigue and unexpected weight change.  HENT: Negative for congestion, mouth sores and sinus pressure.   Eyes: Negative for visual disturbance.  Respiratory: Negative for cough, chest tightness, shortness of breath and wheezing.   Gastrointestinal: Negative for nausea and abdominal pain.  Genitourinary: Negative for frequency, difficulty urinating and vaginal pain.  Musculoskeletal: Negative for back pain and gait problem.  Skin: Negative for pallor and rash.  Neurological: Negative for dizziness, tremors, weakness,  numbness and headaches.  Psychiatric/Behavioral: Negative for suicidal ideas, confusion, sleep disturbance and dysphoric mood. The patient is nervous/anxious.   BP is nl at home  Objective:  BP 160/90 mmHg  Pulse 72  Ht 5\' 4"  (1.626 m)  Wt 180 lb (81.647 kg)  BMI 30.88 kg/m2  SpO2 95%  BP Readings from Last 3 Encounters:  04/10/15 160/90  03/26/15 170/82  10/10/14 152/110    Wt Readings from Last 3 Encounters:  04/10/15 180 lb (81.647 kg)  03/26/15 181 lb (82.101 kg)  10/10/14 185 lb 4 oz (84.029 kg)    Physical Exam  Constitutional: She appears well-developed. No distress.  HENT:  Head: Normocephalic.  Right Ear: External ear normal.  Left Ear: External ear normal.  Nose: Nose normal.  Mouth/Throat: Oropharynx is clear and moist.  Eyes: Conjunctivae are normal. Pupils are equal, round, and reactive to light. Right eye exhibits no discharge. Left eye exhibits no discharge.  Neck: Normal range of motion. Neck supple. No JVD present. No tracheal deviation present. No thyromegaly present.  Cardiovascular: Normal rate, regular rhythm and normal heart sounds.  Exam reveals no gallop and no friction rub.   No murmur heard. Pulmonary/Chest: No stridor. No respiratory distress. She has no wheezes.  Abdominal: Soft. Bowel sounds are normal. She exhibits no distension and no mass. There is no tenderness. There  is no rebound and no guarding.  Genitourinary: No vaginal discharge found.  Musculoskeletal: She exhibits no edema or tenderness.  Lymphadenopathy:    She has no cervical adenopathy.  Neurological: She displays normal reflexes. No cranial nerve deficit. She exhibits normal muscle tone. Coordination normal.  Skin: No rash noted. No erythema.  Psychiatric: She has a normal mood and affect. Her behavior is normal. Judgment and thought content normal.    Lab Results  Component Value Date   WBC 5.0 04/05/2015   HGB 15.2* 04/05/2015   HCT 45.3 04/05/2015   PLT 227.0  04/05/2015   GLUCOSE 96 04/05/2015   CHOL 261* 04/05/2015   TRIG 68.0 04/05/2015   HDL 92.30 04/05/2015   LDLDIRECT 172.9 04/03/2013   LDLCALC 155* 04/05/2015   ALT 27 04/05/2015   AST 28 04/05/2015   NA 140 04/05/2015   K 4.6 04/05/2015   CL 104 04/05/2015   CREATININE 0.84 04/05/2015   BUN 13 04/05/2015   CO2 28 04/05/2015   TSH 2.68 04/05/2015    Mm Digital Screening Bilateral  10/09/2014   CLINICAL DATA:  Screening.  EXAM: DIGITAL SCREENING BILATERAL MAMMOGRAM WITH CAD  COMPARISON:  Previous exam(s).  ACR Breast Density Category b: There are scattered areas of fibroglandular density.  FINDINGS: There are no findings suspicious for malignancy. Images were processed with CAD.  IMPRESSION: No mammographic evidence of malignancy. A result letter of this screening mammogram will be mailed directly to the patient.  RECOMMENDATION: Screening mammogram in one year. (Code:SM-B-01Y)  BI-RADS CATEGORY  1: Negative.   Electronically Signed   By: Nolon Nations M.D.   On: 10/09/2014 15:16    Assessment & Plan:   There are no diagnoses linked to this encounter. I am having [name] maintain her Omega-3 Fatty Acids (FISH OIL PO), cholecalciferol, MAGNESIUM PO, calcium carbonate, aspirin EC, meclizine, cyanocobalamin, propranolol, COD LIVER OIL PO, diazepam, omeprazole, and B Complex Vitamins (B COMPLEX PO).  No orders of the defined types were placed in this encounter.     Follow-up: No Follow-up on file.  Walker Kehr, MD

## 2015-04-10 NOTE — Assessment & Plan Note (Addendum)
Here for medicare wellness/physical  Diet: heart healthy  Physical activity: not sedentary  Depression/mood screen: negative  Hearing: intact to whispered voice  Visual acuity: grossly normal, performs annual eye exam  ADLs: capable  Fall risk: low to none  Home safety: good  Cognitive evaluation: intact to orientation, naming, recall and repetition  EOL planning: adv directives, full code/ I agree  I have personally reviewed and have noted  1. The patient's medical, surgical and social history  2. Their use of alcohol, tobacco or illicit drugs  3. Their current medications and supplements  4. The patient's functional ability including ADL's, fall risks, home safety risks and hearing or visual impairment.  5. Diet and physical activities  6. Evidence for depression or mood disorders 7. The roster of all physicians providing medical care to patient - is listed in the Snapshot section of the chart and reviewed today.    Today patient counseled on age appropriate routine health concerns for screening and prevention, each reviewed and up to date or declined. Immunizations reviewed and up to date or declined. Labs ordered and reviewed. Risk factors for depression reviewed and negative. Hearing function and visual acuity are intact. ADLs screened and addressed as needed. Functional ability and level of safety reviewed and appropriate. Education, counseling and referrals performed based on assessed risks today. Patient provided with a copy of personalized plan for preventive services.   Colon due 2022 Prevnar Refused flu shot

## 2015-04-10 NOTE — Patient Instructions (Signed)
Preventive Care for Adults A healthy lifestyle and preventive care can promote health and wellness. Preventive health guidelines for women include the following key practices.  A routine yearly physical is a good way to check with your health care provider about your health and preventive screening. It is a chance to share any concerns and updates on your health and to receive a thorough exam.  Visit your dentist for a routine exam and preventive care every 6 months. Brush your teeth twice a day and floss once a day. Good oral hygiene prevents tooth decay and gum disease.  The frequency of eye exams is based on your age, health, family medical history, use of contact lenses, and other factors. Follow your health care provider's recommendations for frequency of eye exams.  Eat a healthy diet. Foods like vegetables, fruits, whole grains, low-fat dairy products, and lean protein foods contain the nutrients you need without too many calories. Decrease your intake of foods high in solid fats, added sugars, and salt. Eat the right amount of calories for you.Get information about a proper diet from your health care provider, if necessary.  Regular physical exercise is one of the most important things you can do for your health. Most adults should get at least 150 minutes of moderate-intensity exercise (any activity that increases your heart rate and causes you to sweat) each week. In addition, most adults need muscle-strengthening exercises on 2 or more days a week.  Maintain a healthy weight. The body mass index (BMI) is a screening tool to identify possible weight problems. It provides an estimate of body fat based on height and weight. Your health care provider can find your BMI and can help you achieve or maintain a healthy weight.For adults 20 years and older:  A BMI below 18.5 is considered underweight.  A BMI of 18.5 to 24.9 is normal.  A BMI of 25 to 29.9 is considered overweight.  A BMI of  30 and above is considered obese.  Maintain normal blood lipids and cholesterol levels by exercising and minimizing your intake of saturated fat. Eat a balanced diet with plenty of fruit and vegetables. Blood tests for lipids and cholesterol should begin at age 76 and be repeated every 5 years. If your lipid or cholesterol levels are high, you are over 50, or you are at high risk for heart disease, you may need your cholesterol levels checked more frequently.Ongoing high lipid and cholesterol levels should be treated with medicines if diet and exercise are not working.  If you smoke, find out from your health care provider how to quit. If you do not use tobacco, do not start.  Lung cancer screening is recommended for adults aged 22-80 years who are at high risk for developing lung cancer because of a history of smoking. A yearly low-dose CT scan of the lungs is recommended for people who have at least a 30-pack-year history of smoking and are a current smoker or have quit within the past 15 years. A pack year of smoking is smoking an average of 1 pack of cigarettes a day for 1 year (for example: 1 pack a day for 30 years or 2 packs a day for 15 years). Yearly screening should continue until the smoker has stopped smoking for at least 15 years. Yearly screening should be stopped for people who develop a health problem that would prevent them from having lung cancer treatment.  If you are pregnant, do not drink alcohol. If you are breastfeeding,  be very cautious about drinking alcohol. If you are not pregnant and choose to drink alcohol, do not have more than 1 drink per day. One drink is considered to be 12 ounces (355 mL) of beer, 5 ounces (148 mL) of wine, or 1.5 ounces (44 mL) of liquor.  Avoid use of street drugs. Do not share needles with anyone. Ask for help if you need support or instructions about stopping the use of drugs.  High blood pressure causes heart disease and increases the risk of  stroke. Your blood pressure should be checked at least every 1 to 2 years. Ongoing high blood pressure should be treated with medicines if weight loss and exercise do not work.  If you are 3-86 years old, ask your health care provider if you should take aspirin to prevent strokes.  Diabetes screening involves taking a blood sample to check your fasting blood sugar level. This should be done once every 3 years, after age 67, if you are within normal weight and without risk factors for diabetes. Testing should be considered at a younger age or be carried out more frequently if you are overweight and have at least 1 risk factor for diabetes.  Breast cancer screening is essential preventive care for women. You should practice "breast self-awareness." This means understanding the normal appearance and feel of your breasts and may include breast self-examination. Any changes detected, no matter how small, should be reported to a health care provider. Women in their 8s and 30s should have a clinical breast exam (CBE) by a health care provider as part of a regular health exam every 1 to 3 years. After age 70, women should have a CBE every year. Starting at age 25, women should consider having a mammogram (breast X-ray test) every year. Women who have a family history of breast cancer should talk to their health care provider about genetic screening. Women at a high risk of breast cancer should talk to their health care providers about having an MRI and a mammogram every year.  Breast cancer gene (BRCA)-related cancer risk assessment is recommended for women who have family members with BRCA-related cancers. BRCA-related cancers include breast, ovarian, tubal, and peritoneal cancers. Having family members with these cancers may be associated with an increased risk for harmful changes (mutations) in the breast cancer genes BRCA1 and BRCA2. Results of the assessment will determine the need for genetic counseling and  BRCA1 and BRCA2 testing.  Routine pelvic exams to screen for cancer are no longer recommended for nonpregnant women who are considered low risk for cancer of the pelvic organs (ovaries, uterus, and vagina) and who do not have symptoms. Ask your health care provider if a screening pelvic exam is right for you.  If you have had past treatment for cervical cancer or a condition that could lead to cancer, you need Pap tests and screening for cancer for at least 20 years after your treatment. If Pap tests have been discontinued, your risk factors (such as having a new sexual partner) need to be reassessed to determine if screening should be resumed. Some women have medical problems that increase the chance of getting cervical cancer. In these cases, your health care provider may recommend more frequent screening and Pap tests.  The HPV test is an additional test that may be used for cervical cancer screening. The HPV test looks for the virus that can cause the cell changes on the cervix. The cells collected during the Pap test can be  tested for HPV. The HPV test could be used to screen women aged 30 years and older, and should be used in women of any age who have unclear Pap test results. After the age of 30, women should have HPV testing at the same frequency as a Pap test.  Colorectal cancer can be detected and often prevented. Most routine colorectal cancer screening begins at the age of 50 years and continues through age 75 years. However, your health care provider may recommend screening at an earlier age if you have risk factors for colon cancer. On a yearly basis, your health care provider may provide home test kits to check for hidden blood in the stool. Use of a small camera at the end of a tube, to directly examine the colon (sigmoidoscopy or colonoscopy), can detect the earliest forms of colorectal cancer. Talk to your health care provider about this at age 50, when routine screening begins. Direct  exam of the colon should be repeated every 5-10 years through age 75 years, unless early forms of pre-cancerous polyps or small growths are found.  People who are at an increased risk for hepatitis B should be screened for this virus. You are considered at high risk for hepatitis B if:  You were born in a country where hepatitis B occurs often. Talk with your health care provider about which countries are considered high risk.  Your parents were born in a high-risk country and you have not received a shot to protect against hepatitis B (hepatitis B vaccine).  You have HIV or AIDS.  You use needles to inject street drugs.  You live with, or have sex with, someone who has hepatitis B.  You get hemodialysis treatment.  You take certain medicines for conditions like cancer, organ transplantation, and autoimmune conditions.  Hepatitis C blood testing is recommended for all people born from 1945 through 1965 and any individual with known risks for hepatitis C.  Practice safe sex. Use condoms and avoid high-risk sexual practices to reduce the spread of sexually transmitted infections (STIs). STIs include gonorrhea, chlamydia, syphilis, trichomonas, herpes, HPV, and human immunodeficiency virus (HIV). Herpes, HIV, and HPV are viral illnesses that have no cure. They can result in disability, cancer, and death.  You should be screened for sexually transmitted illnesses (STIs) including gonorrhea and chlamydia if:  You are sexually active and are younger than 24 years.  You are older than 24 years and your health care provider tells you that you are at risk for this type of infection.  Your sexual activity has changed since you were last screened and you are at an increased risk for chlamydia or gonorrhea. Ask your health care provider if you are at risk.  If you are at risk of being infected with HIV, it is recommended that you take a prescription medicine daily to prevent HIV infection. This is  called preexposure prophylaxis (PrEP). You are considered at risk if:  You are a heterosexual woman, are sexually active, and are at increased risk for HIV infection.  You take drugs by injection.  You are sexually active with a partner who has HIV.  Talk with your health care provider about whether you are at high risk of being infected with HIV. If you choose to begin PrEP, you should first be tested for HIV. You should then be tested every 3 months for as long as you are taking PrEP.  Osteoporosis is a disease in which the bones lose minerals and strength   with aging. This can result in serious bone fractures or breaks. The risk of osteoporosis can be identified using a bone density scan. Women ages 65 years and over and women at risk for fractures or osteoporosis should discuss screening with their health care providers. Ask your health care provider whether you should take a calcium supplement or vitamin D to reduce the rate of osteoporosis.  Menopause can be associated with physical symptoms and risks. Hormone replacement therapy is available to decrease symptoms and risks. You should talk to your health care provider about whether hormone replacement therapy is right for you.  Use sunscreen. Apply sunscreen liberally and repeatedly throughout the day. You should seek shade when your shadow is shorter than you. Protect yourself by wearing long sleeves, pants, a wide-brimmed hat, and sunglasses year round, whenever you are outdoors.  Once a month, do a whole body skin exam, using a mirror to look at the skin on your back. Tell your health care provider of new moles, moles that have irregular borders, moles that are larger than a pencil eraser, or moles that have changed in shape or color.  Stay current with required vaccines (immunizations).  Influenza vaccine. All adults should be immunized every year.  Tetanus, diphtheria, and acellular pertussis (Td, Tdap) vaccine. Pregnant women should  receive 1 dose of Tdap vaccine during each pregnancy. The dose should be obtained regardless of the length of time since the last dose. Immunization is preferred during the 27th-36th week of gestation. An adult who has not previously received Tdap or who does not know her vaccine status should receive 1 dose of Tdap. This initial dose should be followed by tetanus and diphtheria toxoids (Td) booster doses every 10 years. Adults with an unknown or incomplete history of completing a 3-dose immunization series with Td-containing vaccines should begin or complete a primary immunization series including a Tdap dose. Adults should receive a Td booster every 10 years.  Varicella vaccine. An adult without evidence of immunity to varicella should receive 2 doses or a second dose if she has previously received 1 dose. Pregnant females who do not have evidence of immunity should receive the first dose after pregnancy. This first dose should be obtained before leaving the health care facility. The second dose should be obtained 4-8 weeks after the first dose.  Human papillomavirus (HPV) vaccine. Females aged 13-26 years who have not received the vaccine previously should obtain the 3-dose series. The vaccine is not recommended for use in pregnant females. However, pregnancy testing is not needed before receiving a dose. If a female is found to be pregnant after receiving a dose, no treatment is needed. In that case, the remaining doses should be delayed until after the pregnancy. Immunization is recommended for any person with an immunocompromised condition through the age of 26 years if she did not get any or all doses earlier. During the 3-dose series, the second dose should be obtained 4-8 weeks after the first dose. The third dose should be obtained 24 weeks after the first dose and 16 weeks after the second dose.  Zoster vaccine. One dose is recommended for adults aged 60 years or older unless certain conditions are  present.  Measles, mumps, and rubella (MMR) vaccine. Adults born before 1957 generally are considered immune to measles and mumps. Adults born in 1957 or later should have 1 or more doses of MMR vaccine unless there is a contraindication to the vaccine or there is laboratory evidence of immunity to   each of the three diseases. A routine second dose of MMR vaccine should be obtained at least 28 days after the first dose for students attending postsecondary schools, health care workers, or international travelers. People who received inactivated measles vaccine or an unknown type of measles vaccine during 1963-1967 should receive 2 doses of MMR vaccine. People who received inactivated mumps vaccine or an unknown type of mumps vaccine before 1979 and are at high risk for mumps infection should consider immunization with 2 doses of MMR vaccine. For females of childbearing age, rubella immunity should be determined. If there is no evidence of immunity, females who are not pregnant should be vaccinated. If there is no evidence of immunity, females who are pregnant should delay immunization until after pregnancy. Unvaccinated health care workers born before 1957 who lack laboratory evidence of measles, mumps, or rubella immunity or laboratory confirmation of disease should consider measles and mumps immunization with 2 doses of MMR vaccine or rubella immunization with 1 dose of MMR vaccine.  Pneumococcal 13-valent conjugate (PCV13) vaccine. When indicated, a person who is uncertain of her immunization history and has no record of immunization should receive the PCV13 vaccine. An adult aged 19 years or older who has certain medical conditions and has not been previously immunized should receive 1 dose of PCV13 vaccine. This PCV13 should be followed with a dose of pneumococcal polysaccharide (PPSV23) vaccine. The PPSV23 vaccine dose should be obtained at least 8 weeks after the dose of PCV13 vaccine. An adult aged 19  years or older who has certain medical conditions and previously received 1 or more doses of PPSV23 vaccine should receive 1 dose of PCV13. The PCV13 vaccine dose should be obtained 1 or more years after the last PPSV23 vaccine dose.  Pneumococcal polysaccharide (PPSV23) vaccine. When PCV13 is also indicated, PCV13 should be obtained first. All adults aged 65 years and older should be immunized. An adult younger than age 65 years who has certain medical conditions should be immunized. Any person who resides in a nursing home or long-term care facility should be immunized. An adult smoker should be immunized. People with an immunocompromised condition and certain other conditions should receive both PCV13 and PPSV23 vaccines. People with human immunodeficiency virus (HIV) infection should be immunized as soon as possible after diagnosis. Immunization during chemotherapy or radiation therapy should be avoided. Routine use of PPSV23 vaccine is not recommended for American Indians, [place] Natives, or people younger than 65 years unless there are medical conditions that require PPSV23 vaccine. When indicated, people who have unknown immunization and have no record of immunization should receive PPSV23 vaccine. One-time revaccination 5 years after the first dose of PPSV23 is recommended for people aged 19-64 years who have chronic kidney failure, nephrotic syndrome, asplenia, or immunocompromised conditions. People who received 1-2 doses of PPSV23 before age 65 years should receive another dose of PPSV23 vaccine at age 65 years or later if at least 5 years have passed since the previous dose. Doses of PPSV23 are not needed for people immunized with PPSV23 at or after age 65 years.  Meningococcal vaccine. Adults with asplenia or persistent complement component deficiencies should receive 2 doses of quadrivalent meningococcal conjugate (MenACWY-D) vaccine. The doses should be obtained at least 2 months apart.  Microbiologists working with certain meningococcal bacteria, military recruits, people at risk during an outbreak, and people who travel to or live in countries with a high rate of meningitis should be immunized. A first-year college student up through age   21 years who is living in a residence hall should receive a dose if she did not receive a dose on or after her 16th birthday. Adults who have certain high-risk conditions should receive one or more doses of vaccine.  Hepatitis A vaccine. Adults who wish to be protected from this disease, have certain high-risk conditions, work with hepatitis A-infected animals, work in hepatitis A research labs, or travel to or work in countries with a high rate of hepatitis A should be immunized. Adults who were previously unvaccinated and who anticipate close contact with an international adoptee during the first 60 days after arrival in the [place] from a country with a high rate of hepatitis A should be immunized.  Hepatitis B vaccine. Adults who wish to be protected from this disease, have certain high-risk conditions, may be exposed to blood or other infectious body fluids, are household contacts or sex partners of hepatitis B positive people, are clients or workers in certain care facilities, or travel to or work in countries with a high rate of hepatitis B should be immunized.  Haemophilus influenzae type b (Hib) vaccine. A previously unvaccinated person with asplenia or sickle cell disease or having a scheduled splenectomy should receive 1 dose of Hib vaccine. Regardless of previous immunization, a recipient of a hematopoietic stem cell transplant should receive a 3-dose series 6-12 months after her successful transplant. Hib vaccine is not recommended for adults with HIV infection. Preventive Services / Frequency Ages 64 to 68 years  Blood pressure check.** / Every 1 to 2 years.  Lipid and cholesterol check.** / Every 5 years beginning at age  22.  Clinical breast exam.** / Every 3 years for women in their 88s and 53s.  BRCA-related cancer risk assessment.** / For women who have family members with a BRCA-related cancer (breast, ovarian, tubal, or peritoneal cancers).  Pap test.** / Every 2 years from ages 90 through 51. Every 3 years starting at age 21 through age 56 or 3 with a history of 3 consecutive normal Pap tests.  HPV screening.** / Every 3 years from ages 24 through ages 1 to 46 with a history of 3 consecutive normal Pap tests.  Hepatitis C blood test.** / For any individual with known risks for hepatitis C.  Skin self-exam. / Monthly.  Influenza vaccine. / Every year.  Tetanus, diphtheria, and acellular pertussis (Tdap, Td) vaccine.** / Consult your health care provider. Pregnant women should receive 1 dose of Tdap vaccine during each pregnancy. 1 dose of Td every 10 years.  Varicella vaccine.** / Consult your health care provider. Pregnant females who do not have evidence of immunity should receive the first dose after pregnancy.  HPV vaccine. / 3 doses over 6 months, if 72 and younger. The vaccine is not recommended for use in pregnant females. However, pregnancy testing is not needed before receiving a dose.  Measles, mumps, rubella (MMR) vaccine.** / You need at least 1 dose of MMR if you were born in 1957 or later. You may also need a 2nd dose. For females of childbearing age, rubella immunity should be determined. If there is no evidence of immunity, females who are not pregnant should be vaccinated. If there is no evidence of immunity, females who are pregnant should delay immunization until after pregnancy.  Pneumococcal 13-valent conjugate (PCV13) vaccine.** / Consult your health care provider.  Pneumococcal polysaccharide (PPSV23) vaccine.** / 1 to 2 doses if you smoke cigarettes or if you have certain conditions.  Meningococcal vaccine.** /  1 dose if you are age 19 to 21 years and a first-year college  student living in a residence hall, or have one of several medical conditions, you need to get vaccinated against meningococcal disease. You may also need additional booster doses.  Hepatitis A vaccine.** / Consult your health care provider.  Hepatitis B vaccine.** / Consult your health care provider.  Haemophilus influenzae type b (Hib) vaccine.** / Consult your health care provider. Ages 40 to 64 years  Blood pressure check.** / Every 1 to 2 years.  Lipid and cholesterol check.** / Every 5 years beginning at age 20 years.  Lung cancer screening. / Every year if you are aged 55-80 years and have a 30-pack-year history of smoking and currently smoke or have quit within the past 15 years. Yearly screening is stopped once you have quit smoking for at least 15 years or develop a health problem that would prevent you from having lung cancer treatment.  Clinical breast exam.** / Every year after age 40 years.  BRCA-related cancer risk assessment.** / For women who have family members with a BRCA-related cancer (breast, ovarian, tubal, or peritoneal cancers).  Mammogram.** / Every year beginning at age 40 years and continuing for as long as you are in good health. Consult with your health care provider.  Pap test.** / Every 3 years starting at age 30 years through age 65 or 70 years with a history of 3 consecutive normal Pap tests.  HPV screening.** / Every 3 years from ages 30 years through ages 65 to 70 years with a history of 3 consecutive normal Pap tests.  Fecal occult blood test (FOBT) of stool. / Every year beginning at age 50 years and continuing until age 75 years. You may not need to do this test if you get a colonoscopy every 10 years.  Flexible sigmoidoscopy or colonoscopy.** / Every 5 years for a flexible sigmoidoscopy or every 10 years for a colonoscopy beginning at age 50 years and continuing until age 75 years.  Hepatitis C blood test.** / For all people born from 1945 through  1965 and any individual with known risks for hepatitis C.  Skin self-exam. / Monthly.  Influenza vaccine. / Every year.  Tetanus, diphtheria, and acellular pertussis (Tdap/Td) vaccine.** / Consult your health care provider. Pregnant women should receive 1 dose of Tdap vaccine during each pregnancy. 1 dose of Td every 10 years.  Varicella vaccine.** / Consult your health care provider. Pregnant females who do not have evidence of immunity should receive the first dose after pregnancy.  Zoster vaccine.** / 1 dose for adults aged 60 years or older.  Measles, mumps, rubella (MMR) vaccine.** / You need at least 1 dose of MMR if you were born in 1957 or later. You may also need a 2nd dose. For females of childbearing age, rubella immunity should be determined. If there is no evidence of immunity, females who are not pregnant should be vaccinated. If there is no evidence of immunity, females who are pregnant should delay immunization until after pregnancy.  Pneumococcal 13-valent conjugate (PCV13) vaccine.** / Consult your health care provider.  Pneumococcal polysaccharide (PPSV23) vaccine.** / 1 to 2 doses if you smoke cigarettes or if you have certain conditions.  Meningococcal vaccine.** / Consult your health care provider.  Hepatitis A vaccine.** / Consult your health care provider.  Hepatitis B vaccine.** / Consult your health care provider.  Haemophilus influenzae type b (Hib) vaccine.** / Consult your health care provider. Ages 65   years and over  Blood pressure check.** / Every 1 to 2 years.  Lipid and cholesterol check.** / Every 5 years beginning at age 22 years.  Lung cancer screening. / Every year if you are aged 73-80 years and have a 30-pack-year history of smoking and currently smoke or have quit within the past 15 years. Yearly screening is stopped once you have quit smoking for at least 15 years or develop a health problem that would prevent you from having lung cancer  treatment.  Clinical breast exam.** / Every year after age 4 years.  BRCA-related cancer risk assessment.** / For women who have family members with a BRCA-related cancer (breast, ovarian, tubal, or peritoneal cancers).  Mammogram.** / Every year beginning at age 40 years and continuing for as long as you are in good health. Consult with your health care provider.  Pap test.** / Every 3 years starting at age 9 years through age 34 or 91 years with 3 consecutive normal Pap tests. Testing can be stopped between 65 and 70 years with 3 consecutive normal Pap tests and no abnormal Pap or HPV tests in the past 10 years.  HPV screening.** / Every 3 years from ages 57 years through ages 64 or 45 years with a history of 3 consecutive normal Pap tests. Testing can be stopped between 65 and 70 years with 3 consecutive normal Pap tests and no abnormal Pap or HPV tests in the past 10 years.  Fecal occult blood test (FOBT) of stool. / Every year beginning at age 15 years and continuing until age 17 years. You may not need to do this test if you get a colonoscopy every 10 years.  Flexible sigmoidoscopy or colonoscopy.** / Every 5 years for a flexible sigmoidoscopy or every 10 years for a colonoscopy beginning at age 86 years and continuing until age 71 years.  Hepatitis C blood test.** / For all people born from [birth year] through 1967 and any individual with known risks for hepatitis C.  Osteoporosis screening.** / A one-time screening for women ages 83 years and over and women at risk for fractures or osteoporosis.  Skin self-exam. / Monthly.  Influenza vaccine. / Every year.  Tetanus, diphtheria, and acellular pertussis (Tdap/Td) vaccine.** / 1 dose of Td every 10 years.  Varicella vaccine.** / Consult your health care provider.  Zoster vaccine.** / 1 dose for adults aged 61 years or older.  Pneumococcal 13-valent conjugate (PCV13) vaccine.** / Consult your health care provider.  Pneumococcal  polysaccharide (PPSV23) vaccine.** / 1 dose for all adults aged 28 years and older.  Meningococcal vaccine.** / Consult your health care provider.  Hepatitis A vaccine.** / Consult your health care provider.  Hepatitis B vaccine.** / Consult your health care provider.  Haemophilus influenzae type b (Hib) vaccine.** / Consult your health care provider. ** Family history and personal history of risk and conditions may change your health care provider's recommendations. Document Released: 09/29/2001 Document Revised: 12/18/2013 Document Reviewed: 12/29/2010 [facility] Patient Information 2015 [place], [place]. This information is not intended to replace advice given to you by your health care provider. Make sure you discuss any questions you have with your health care provider.

## 2015-04-10 NOTE — Assessment & Plan Note (Signed)
Recurrent ST - mild, anxiety Dr Acie Fredrickson

## 2015-04-18 ENCOUNTER — Telehealth: Payer: Self-pay | Admitting: Internal Medicine

## 2015-04-18 DIAGNOSIS — R300 Dysuria: Secondary | ICD-10-CM

## 2015-04-18 NOTE — Telephone Encounter (Signed)
Please put labs in

## 2015-04-18 NOTE — Telephone Encounter (Signed)
Order for UA entered

## 2015-04-18 NOTE — Telephone Encounter (Signed)
Patient has called back in regards.  States that her phone is almost out of battery and she will be out and about.  States to try to call back as soon as possible with a response.

## 2015-04-18 NOTE — Telephone Encounter (Signed)
Ok UA Thx 

## 2015-04-18 NOTE — Telephone Encounter (Signed)
Patient ask if she can come in and give another specimen, she is experiencing some burning when she pee, please advise

## 2015-04-19 ENCOUNTER — Telehealth: Payer: Self-pay

## 2015-04-19 ENCOUNTER — Other Ambulatory Visit: Payer: Medicare Other

## 2015-04-19 DIAGNOSIS — R300 Dysuria: Secondary | ICD-10-CM

## 2015-04-19 NOTE — Telephone Encounter (Signed)
Patient came in to the office at [facility] requesting that someone talk with her about her telephone called placed yesterday 9/1 concerning her recent labs (wanting someone to take a second look at labs) and stated she is now experiencing burning sensation when urinating---patient states that she was told by patricia/front office yesterday that dr Alain Marion had placed an order for urine culture, patient could come to lab to give specimen and we would know results by 5pm on 9/1 (someone would call the patient back)---but after patient came in yesterday to the lab and was told by lab that urine culture would take several days (they send lab off to be cultured), patient was very upset and came up to our office this morning wanting to talk with someone about why she was given that information yesterday---i explained to patient that if that was the information given to her by patricia yesterday, that information was not correct, cultures require a few days to collect info about urine sample--but we could do a urinalysis now in the office to see quick results and then know where to go from there---i did UA and also sent urine to lab for culture per patient request--UA did not show any abnormalities, Plotnikov advised to have patient take Azo and Cortisone Cream OTC to alleviate burning and after culture findings come back, we could advise with other meds if necessary---if culture is negative and patient does not get better with OTC meds, patient needs urology referral per plotnikov----patient needs to be called with urine culture results and asked at that time if she is feeling better---Laura Vasquez will be following results and will be calling patient back with that info---also results will be mailed to patient per patient request

## 2015-04-20 LAB — URINALYSIS W MICROSCOPIC + REFLEX CULTURE
BACTERIA UA: NONE SEEN [HPF]
BILIRUBIN URINE: NEGATIVE
CRYSTALS: NONE SEEN [HPF]
Casts: NONE SEEN [LPF]
Glucose, UA: NEGATIVE
HGB URINE DIPSTICK: NEGATIVE
Ketones, ur: NEGATIVE
Leukocytes, UA: NEGATIVE
Nitrite: NEGATIVE
PROTEIN: NEGATIVE
RBC / HPF: NONE SEEN RBC/HPF (ref <=2)
SQUAMOUS EPITHELIAL / LPF: NONE SEEN [HPF] (ref <=5)
Specific Gravity, Urine: 1.002 (ref 1.001–1.035)
WBC, UA: NONE SEEN WBC/HPF (ref <=5)
YEAST: NONE SEEN [HPF]
pH: 7 (ref 5.0–8.0)

## 2015-05-24 ENCOUNTER — Ambulatory Visit (INDEPENDENT_AMBULATORY_CARE_PROVIDER_SITE_OTHER): Payer: Medicare Other

## 2015-05-24 DIAGNOSIS — Z23 Encounter for immunization: Secondary | ICD-10-CM

## 2015-06-03 ENCOUNTER — Ambulatory Visit (INDEPENDENT_AMBULATORY_CARE_PROVIDER_SITE_OTHER): Payer: Medicare Other | Admitting: Internal Medicine

## 2015-06-03 ENCOUNTER — Encounter: Payer: Self-pay | Admitting: Internal Medicine

## 2015-06-03 VITALS — BP 154/96 | HR 96 | Temp 98.1°F | Resp 16 | Wt 177.0 lb

## 2015-06-03 DIAGNOSIS — M25561 Pain in right knee: Secondary | ICD-10-CM | POA: Diagnosis not present

## 2015-06-03 DIAGNOSIS — S88111A Complete traumatic amputation at level between knee and ankle, right lower leg, initial encounter: Secondary | ICD-10-CM

## 2015-06-03 NOTE — Patient Instructions (Signed)
Use an anti-inflammatory cream such as Aspercreme or Zostrix cream twice a day to the affected area as needed. In lieu of this warm moist compresses or  hot water bottle can be used. Do not apply ice any longer .Please take enteric-coated aspirin 81 mg daily after each meal if having knee pain. Consider glucosamine sulfate 1500 mg daily for joint symptoms. Take this daily  for 3 months and then leave it off for 2 months. This will rehydrate the cartilages.

## 2015-06-03 NOTE — Progress Notes (Signed)
   Subjective:    Patient ID: Laura Vasquez, female    DOB: 07-Feb-1946, 69 y.o.   MRN: [id number]  HPI She was in the checkout line at the grocery store 06/01/15 when she slipped on a crushed grape. She landed on the palm of her right hand and the right knee. Ice was applied immediately and continued until yesterday afternoon.  She had no cardiac or neurologic prodrome prior to the fall.  She continues to have some pain in the right knee. When she puts pressure on the right palm she has discomfort in upper right arm near the shoulder.   Review of Systems  Denied were any change in heart rhythm or rate prior to the event. There was no associated chest pain or shortness of breath .  Also specifically denied prior to the episode were headache, limb weakness, tingling, or numbness. No seizure activity noted.     Objective:   Physical Exam Pertinent or positive findings include: There's full range of motion of the right wrist, elbow, and shoulder. She can extend the R arm fully above the head. There may be minimal effusion of the right knee. There is slight crepitus of both knees. She has DIP osteoarthritic changes of the hand. She is able to lie flat and sit up without help.  General appearance :adequately nourished; in no distress.  Eyes: No conjunctival inflammation or scleral icterus is present.  Heart:  Normal rate and regular rhythm. S1 and S2 normal without gallop, murmur, click, rub or other extra sounds    Lungs:Chest clear to auscultation; no wheezes, rhonchi,rales ,or rubs present.No increased work of breathing.   Abdomen: bowel sounds normal, soft and non-tender without masses, organomegaly or hernias noted.  No guarding or rebound. No flank tenderness to percussion.  Vascular : all pulses equal ; no bruits present.  Skin:Warm & dry.  Intact without suspicious lesions or rashes ; no tenting or jaundice   Lymphatic: No lymphadenopathy is noted about the head, neck, axilla.     Neuro: Strength, tone normal.     Assessment & Plan:  #1 right knee injury; possible slight effusion  #2 hand injury without evidence of fracture   Plan: See after visit summary. Pain control options are limited by her intolerance to codeine and sulfa.

## 2015-06-03 NOTE — Progress Notes (Signed)
Pre visit review using our clinic review tool, if applicable. No additional management support is needed unless otherwise documented below in the visit note. 

## 2015-06-11 ENCOUNTER — Other Ambulatory Visit (INDEPENDENT_AMBULATORY_CARE_PROVIDER_SITE_OTHER): Payer: Medicare Other

## 2015-06-11 ENCOUNTER — Ambulatory Visit (INDEPENDENT_AMBULATORY_CARE_PROVIDER_SITE_OTHER): Payer: Medicare Other | Admitting: Family Medicine

## 2015-06-11 ENCOUNTER — Encounter: Payer: Self-pay | Admitting: Family Medicine

## 2015-06-11 VITALS — BP 138/88 | HR 91 | Ht 64.0 in | Wt 177.0 lb

## 2015-06-11 DIAGNOSIS — M25561 Pain in right knee: Secondary | ICD-10-CM | POA: Diagnosis not present

## 2015-06-11 DIAGNOSIS — S8001XA Contusion of right knee, initial encounter: Secondary | ICD-10-CM | POA: Diagnosis not present

## 2015-06-11 DIAGNOSIS — S8000XA Contusion of unspecified knee, initial encounter: Secondary | ICD-10-CM | POA: Insufficient documentation

## 2015-06-11 NOTE — Progress Notes (Signed)
[facility] [address] [place], [place] [address] Phone: [phone] Subjective:    I'm seeing this patient by the request  of:  Walker Kehr, MD   CC: Knee pain after fall  [id number] JISELLE SHEU is a 69 y.o. female coming in with complaint of knee pain after fall. This is her right knee. Patient fell October 15 at a grocery store. Patient fell directly onto the anterior aspect of her knee when she slipped on grapes. Patient states that she had pain immediately was brought able to ambulate. Patient states that it was very sore and was somewhat stiff. Did see Dr. Linna Darner originally. Patient was diagnosed with a possible slight effusion of the knee second due to the swelling that was seen at that time. Was told to do more if he can follow-up with me. Patient states since that time the knee pain has seemed to be improving. Still has some mild stiffness. Still has a new tingling sensation on the anterior aspect of the knee. No locking, or giving out on her. Denies any radiation down the legs or any numbness. Patient able to do daily activities. Hasn't by avoid direct impact on the knee at this time. Rates the severity of pain is 2 out of 10.  Past Medical History  Diagnosis Date  . Anxiety   . Hypertension   . Allergy   . GERD (gastroesophageal reflux disease)   . Hyperlipidemia   . Fibromyalgia   . Mitral regurgitation   . Palpitations   . IBS (irritable bowel syndrome)   . Vitamin B12 deficiency   . Hyperplastic colon polyp   . Peptic stricture of esophagus   . Erosive esophagitis   . Osteoarthritis   . Fatty liver   . Arrhythmia    Past Surgical History  Procedure Laterality Date  . Cholecystectomy    . Tubal ligation    . Appendectomy    . Abdominal hysterectomy     Social History  Substance Use Topics  . Smoking status: Former Smoker    Quit date: 08/18/1991  . Smokeless tobacco: Never Used  . Alcohol Use: No     Comment: 1-2  glasses    Allergies  Allergen Reactions  . Amoxicillin-Pot Clavulanate   . Amoxicillin-Pot Clavulanate   . Ceftin [Cefuroxime Axetil]     Lips burning  . Codeine   . Naproxen   . Sulfonamide Derivatives    Family History  Problem Relation Age of Onset  . Hypertension Mother   . Ovarian cancer Mother   . Hypertension Father   . Ovarian cancer Sister   . Fibromyalgia Sister     x 2  . Colon cancer Neg Hx   . Diabetes Sister         Past medical history, social, surgical and family history all reviewed in electronic medical record.   Review of Systems: No headache, visual changes, nausea, vomiting, diarrhea, constipation, dizziness, abdominal pain, skin rash, fevers, chills, night sweats, weight loss, swollen lymph nodes, body aches, joint swelling, muscle aches, chest pain, shortness of breath, mood changes.   Objective Blood pressure 138/88, pulse 91, height 5\' 4"  (1.626 m), weight 177 lb (80.287 kg), SpO2 96 %.  General: No apparent distress alert and oriented x3 mood and affect normal, dressed appropriately.  HEENT: Pupils equal, extraocular movements intact  Respiratory: Patient's speak in full sentences and does not appear short of breath  Cardiovascular: No lower extremity edema, non tender,  no erythema  Skin: Warm dry intact with no signs of infection or rash on extremities or on axial skeleton.  Abdomen: Soft nontender  Neuro: Cranial nerves II through XII are intact, neurovascularly intact in all extremities with 2+ DTRs and 2+ pulses.  Lymph: No lymphadenopathy of posterior or anterior cervical chain or axillae bilaterally.  Gait normal with good balance and coordination.  MSK:  Non tender with full range of motion and good stability and symmetric strength and tone of shoulders, elbows, wrist, hip, and ankles bilaterally.  Knee: Right Patient does have what appears to have a resolving contusion on the anterior aspect of the knee. No effusion noted  today. Palpation normal with no warmth, joint line tenderness, patellar tenderness, or condyle tenderness. ROM full in flexion and extension and lower leg rotation. Ligaments with solid consistent endpoints including ACL, PCL, LCL, MCL. Negative Mcmurray's, Apley's, and Thessalonian tests. Non painful patellar compression. Patellar glide without crepitus. Patellar and quadriceps tendons unremarkable. Hamstring and quadriceps strength is normal.  Contralateral knee unremarkable  MSK US performed of: Right knee This study was ordered, performed, and interpreted by Charlann Boxer D.O.  Knee: All structures visualized. Anteromedial, anterolateral, posteromedial, and posterolateral menisci unremarkable without tearing, fraying, effusion, or displacement. Minimal degenerative changes noted. Moderate during of the patellofemoral joint Patellar Tendon unremarkable on long and transverse views without effusion. Over the patella itself there is an area that appears to have some mild thickening of bone that is consistent with more of a contusion but no true cortical defect noted. No abnormality of prepatellar bursa. LCL and MCL unremarkable on long and transverse views. No abnormality of origin of medial or lateral head of the gastrocnemius.  IMPRESSION:  Patellar contusion   Impression and Recommendations:     This case required medical decision making of moderate complexity.

## 2015-06-11 NOTE — Assessment & Plan Note (Signed)
Patient does have a contusion of the patella that slightly from the fall. We discussed with patient is so likely resolve over the course the next 2-4 weeks. Patient is going to try over-the-counter topical medication as well as given a trial of an anti-inflammatory medication. Patient will do and icing protocol. Avoid direct impact in the area. No residual hematoma noted today. No internal derangement of the knee. His lungs patient continues to improve she can follow-up on an as-needed basis. Prognosis is very good.

## 2015-06-11 NOTE — Progress Notes (Signed)
Pre visit review using our clinic review tool, if applicable. No additional management support is needed unless otherwise documented below in the visit note. 

## 2015-06-11 NOTE — Patient Instructions (Signed)
Bone contusion to the knee.  Arnica lotion 2 times a day Ice 20 minutes 2 times daily. Usually after activity and before bed. pennsaid pinkie amount topically 2 times daily as needed.  Avoid direct impact on the knee for another 2 weeks.  Should be gone in 2 weeks otherwise see me again .

## 2015-07-01 ENCOUNTER — Encounter: Payer: Self-pay | Admitting: Family Medicine

## 2015-07-01 ENCOUNTER — Other Ambulatory Visit: Payer: Self-pay | Admitting: Internal Medicine

## 2015-07-01 ENCOUNTER — Ambulatory Visit (INDEPENDENT_AMBULATORY_CARE_PROVIDER_SITE_OTHER): Payer: Medicare Other | Admitting: Family Medicine

## 2015-07-01 VITALS — BP 156/86 | HR 91 | Ht 64.0 in | Wt 180.0 lb

## 2015-07-01 DIAGNOSIS — S8001XD Contusion of right knee, subsequent encounter: Secondary | ICD-10-CM | POA: Diagnosis not present

## 2015-07-01 NOTE — Progress Notes (Signed)
[facility] [address] [place], [place] [address] Phone: [phone] Subjective:    I'm seeing this patient by the request  of:  Walker Kehr, MD   CC: Knee pain after fall  [id number] Laura Vasquez is a 69 y.o. female coming in with complaint of knee pain after fall. This is her right knee. Patient fell October 15 at a grocery store.  Patient was found to have more of a patellar contusion. Patient was doing conservative therapy including icing, topical anti-inflammatories, icing. Patient was to do some range of motion exercises. Patient states she is approximately 80% better. Patient states that the pain has decreased significantly. Patient states that she still has some mild discomfort with walking a lot. Still on the anterior aspect of the knee. No locking or giving out on her. Has had to stop one episode when she went to the store in walking on hard surfaces is causing increasing pain.  Past Medical History  Diagnosis Date  . Anxiety   . Hypertension   . Allergy   . GERD (gastroesophageal reflux disease)   . Hyperlipidemia   . Fibromyalgia   . Mitral regurgitation   . Palpitations   . IBS (irritable bowel syndrome)   . Vitamin B12 deficiency   . Hyperplastic colon polyp   . Peptic stricture of esophagus   . Erosive esophagitis   . Osteoarthritis   . Fatty liver   . Arrhythmia    Past Surgical History  Procedure Laterality Date  . Cholecystectomy    . Tubal ligation    . Appendectomy    . Abdominal hysterectomy     Social History  Substance Use Topics  . Smoking status: Former Smoker    Quit date: 08/18/1991  . Smokeless tobacco: Never Used  . Alcohol Use: No     Comment: 1-2 glasses    Allergies  Allergen Reactions  . Amoxicillin-Pot Clavulanate   . Amoxicillin-Pot Clavulanate   . Ceftin [Cefuroxime Axetil]     Lips burning  . Codeine   . Naproxen   . Sulfonamide Derivatives    Family History  Problem Relation Age  of Onset  . Hypertension Mother   . Ovarian cancer Mother   . Hypertension Father   . Ovarian cancer Sister   . Fibromyalgia Sister     x 2  . Colon cancer Neg Hx   . Diabetes Sister         Past medical history, social, surgical and family history all reviewed in electronic medical record.   Review of Systems: No headache, visual changes, nausea, vomiting, diarrhea, constipation, dizziness, abdominal pain, skin rash, fevers, chills, night sweats, weight loss, swollen lymph nodes, body aches, joint swelling, muscle aches, chest pain, shortness of breath, mood changes.   Objective Blood pressure 156/86, pulse 91, height 5\' 4"  (1.626 m), weight 180 lb (81.647 kg), SpO2 96 %.  General: No apparent distress alert and oriented x3 mood and affect normal, dressed appropriately.  HEENT: Pupils equal, extraocular movements intact  Respiratory: Patient's speak in full sentences and does not appear short of breath  Cardiovascular: No lower extremity edema, non tender, no erythema  Skin: Warm dry intact with no signs of infection or rash on extremities or on axial skeleton.  Abdomen: Soft nontender  Neuro: Cranial nerves II through XII are intact, neurovascularly intact in all extremities with 2+ DTRs and 2+ pulses.  Lymph: No lymphadenopathy of posterior or anterior cervical chain or axillae  bilaterally.  Gait normal with good balance and coordination.  MSK:  Non tender with full range of motion and good stability and symmetric strength and tone of shoulders, elbows, wrist, hip, and ankles bilaterally.  Knee: Right No effusion noted. Bruising seen previously is completely resolved. Palpation normal with no warmth, joint line tenderness, patellar tenderness, or condyle tenderness. ROM full in flexion and extension and lower leg rotation. Ligaments with solid consistent endpoints including ACL, PCL, LCL, MCL. Negative Mcmurray's, Apley's, and Thessalonian tests. Non painful patellar  compression. Patellar glide without crepitus. Patellar and quadriceps tendons unremarkable. Hamstring and quadriceps strength is normal.  Contralateral knee unremarkable  MSK US performed of: Right knee This study was ordered, performed, and interpreted by Charlann Boxer D.O.  Knee: All structures visualized. Anteromedial, anterolateral, posteromedial, and posterolateral menisci unremarkable without tearing, fraying, effusion, or displacement. Minimal degenerative changes noted. Moderate during of the patellofemoral joint Patellar Tendon unremarkable on long and transverse views without effusion. Over the patella has minimal thickening of the bones the left. No overlying hypoechoic changes. No significant increase in Doppler flow. No abnormality of prepatellar bursa. LCL and MCL unremarkable on long and transverse views. No abnormality of origin of medial or lateral head of the gastrocnemius.  IMPRESSION:  Patellar contusion nearly resolved   Impression and Recommendations:     This case required medical decision making of moderate complexity.

## 2015-07-01 NOTE — Progress Notes (Signed)
Pre visit review using our clinic review tool, if applicable. No additional management support is needed unless otherwise documented below in the visit note. 

## 2015-07-01 NOTE — Patient Instructions (Signed)
Good t see you Ice is your friend at night Stay active and increase activity Consider turmeric 500mg  twice daily Continue the vitamin D We will see you again in 4 weeks.  o

## 2015-07-01 NOTE — Assessment & Plan Note (Signed)
Patient is doing significantly better at this time. Patient does have some moderate underlying arthritic changes that are likely exacerbated from a fall. Patient's patellar contusion is improving and I think patient will continue to improve. I think patient should be pain-free within the next 2-3 weeks. Patient will start increasing her activity given an exercise prescription. Patient will come back and see me again in 4 weeks to hopefully be fully released.

## 2015-07-27 ENCOUNTER — Other Ambulatory Visit: Payer: Self-pay | Admitting: Internal Medicine

## 2015-07-29 ENCOUNTER — Encounter: Payer: Self-pay | Admitting: Family Medicine

## 2015-07-29 ENCOUNTER — Ambulatory Visit (INDEPENDENT_AMBULATORY_CARE_PROVIDER_SITE_OTHER): Payer: Medicare Other | Admitting: Family Medicine

## 2015-07-29 VITALS — BP 144/90 | HR 90 | Ht 64.0 in | Wt 181.0 lb

## 2015-07-29 DIAGNOSIS — S8001XD Contusion of right knee, subsequent encounter: Secondary | ICD-10-CM

## 2015-07-29 NOTE — Assessment & Plan Note (Signed)
Patient does have more of a contusion of the patella. Still does not seem to be completely healed. I do not feel that further workup is necessary. Differential also includes patellofemoral syndrome, underlying arthritis or posttraumatic arthritis, or possibly a small fracture of the patella tendon is not seen on the articular surface. Patient though is doing very well and this is not inhibiting her daily activities but continues to give her discomfort. Discussed with patient that this could take another 6-8 weeks to heal completely. Encourage patient to continue the same regimen. Patient given some exercises and range of motion exercises to do on a more daily basis. Patient will try these and come back and see me again in 4 weeks. We did discuss the possibility of formal physical therapy which patient declined. Continue vitamins. Discussed compression sleeve.  Spent  25 minutes with patient face-to-face and had greater than 50% of counseling including as described above in assessment and plan.

## 2015-07-29 NOTE — Progress Notes (Signed)
[facility] [place], [place] [address] Phone: [phone] Subjective:     CC: Knee pain after fall  [id number] Laura Vasquez is a 69 y.o. female coming in with complaint of knee pain after fall. This is her right knee. Patient fell October 15 at a grocery store.  Patient was found to have more of a patellar contusion. Patient has been doing fairly well with conservative therapy at this time. We discussed icing regimen and home exercises. Patient had been doing the icing but has not been doing the exercises regular. Still has some tightness when she tries to flex her needle away. Denies any instability but states that she continues to walk with a mild limp. Not stopping her from daily activities. Continues to remain active.  Past Medical History  Diagnosis Date  . Anxiety   . Hypertension   . Allergy   . GERD (gastroesophageal reflux disease)   . Hyperlipidemia   . Fibromyalgia   . Mitral regurgitation   . Palpitations   . IBS (irritable bowel syndrome)   . Vitamin B12 deficiency   . Hyperplastic colon polyp   . Peptic stricture of esophagus   . Erosive esophagitis   . Osteoarthritis   . Fatty liver   . Arrhythmia    Past Surgical History  Procedure Laterality Date  . Cholecystectomy    . Tubal ligation    . Appendectomy    . Abdominal hysterectomy     Social History  Substance Use Topics  . Smoking status: Former Smoker    Quit date: 08/18/1991  . Smokeless tobacco: Never Used  . Alcohol Use: No     Comment: 1-2 glasses    Allergies  Allergen Reactions  . Amoxicillin-Pot Clavulanate   . Amoxicillin-Pot Clavulanate   . Ceftin [Cefuroxime Axetil]     Lips burning  . Codeine   . Naproxen   . Sulfonamide Derivatives    Family History  Problem Relation Age of Onset  . Hypertension Mother   . Ovarian cancer Mother   . Hypertension Father   . Ovarian cancer Sister   . Fibromyalgia Sister     x 2  . Colon cancer  Neg Hx   . Diabetes Sister         Past medical history, social, surgical and family history all reviewed in electronic medical record.   Review of Systems: No headache, visual changes, nausea, vomiting, diarrhea, constipation, dizziness, abdominal pain, skin rash, fevers, chills, night sweats, weight loss, swollen lymph nodes, body aches, joint swelling, muscle aches, chest pain, shortness of breath, mood changes.   Objective Blood pressure 144/90, pulse 90, height 5\' 4"  (1.626 m), weight 181 lb (82.101 kg), SpO2 96 %.  General: No apparent distress alert and oriented x3 mood and affect normal, dressed appropriately.  HEENT: Pupils equal, extraocular movements intact  Respiratory: Patient's speak in full sentences and does not appear short of breath  Cardiovascular: No lower extremity edema, non tender, no erythema  Skin: Warm dry intact with no signs of infection or rash on extremities or on axial skeleton.  Abdomen: Soft nontender  Neuro: Cranial nerves II through XII are intact, neurovascularly intact in all extremities with 2+ DTRs and 2+ pulses.  Lymph: No lymphadenopathy of posterior or anterior cervical chain or axillae bilaterally.  Gait normal with good balance and coordination.  MSK:  Non tender with full range of motion and good stability and symmetric strength and tone of  shoulders, elbows, wrist, hip, and ankles bilaterally.  Knee: Right No effusion noted.  Palpation normal with no warmth, joint line tenderness, patellar tenderness, or condyle tenderness. ROM full in flexion and extension and lower leg rotation. Ligaments with solid consistent endpoints including ACL, PCL, LCL, MCL. Negative Mcmurray's, Apley's, and Thessalonian tests. Non painful patellar compression. Patellar glide without crepitus. Patellar and quadriceps tendons unremarkable. Hamstring and quadriceps strength is normal.  Contralateral knee unremarkable     Impression and Recommendations:      This case required medical decision making of moderate complexity.

## 2015-07-29 NOTE — Progress Notes (Signed)
Pre visit review using our clinic review tool, if applicable. No additional management support is needed unless otherwise documented below in the visit note. 

## 2015-07-29 NOTE — Patient Instructions (Signed)
Good to see you.  Ice 20 minutes 2 times daily. Usually after activity and before bed. Exercises 3 times a week. At most 1 time a day.  Copper sleeve from [facility] daily on the knee can help Continue with the pennsaid twice daily as needed Vitamin D I would continue See me again in 4 weeks.

## 2015-08-06 NOTE — Telephone Encounter (Signed)
error 

## 2015-08-14 ENCOUNTER — Ambulatory Visit (INDEPENDENT_AMBULATORY_CARE_PROVIDER_SITE_OTHER): Payer: Medicare Other | Admitting: Internal Medicine

## 2015-08-14 ENCOUNTER — Encounter: Payer: Self-pay | Admitting: Internal Medicine

## 2015-08-14 VITALS — BP 170/100 | HR 83 | Wt 179.0 lb

## 2015-08-14 DIAGNOSIS — D582 Other hemoglobinopathies: Secondary | ICD-10-CM

## 2015-08-14 DIAGNOSIS — R000 Tachycardia, unspecified: Secondary | ICD-10-CM | POA: Diagnosis not present

## 2015-08-14 DIAGNOSIS — F411 Generalized anxiety disorder: Secondary | ICD-10-CM | POA: Diagnosis not present

## 2015-08-14 DIAGNOSIS — N762 Acute vulvitis: Secondary | ICD-10-CM | POA: Insufficient documentation

## 2015-08-14 DIAGNOSIS — K5730 Diverticulosis of large intestine without perforation or abscess without bleeding: Secondary | ICD-10-CM

## 2015-08-14 MED ORDER — OMEPRAZOLE 20 MG PO CPDR: 20.0000 mg | DELAYED_RELEASE_CAPSULE | Freq: Every day | ORAL | Status: DC

## 2015-08-14 MED ORDER — CIPROFLOXACIN HCL 500 MG PO TABS: 500.0000 mg | ORAL_TABLET | Freq: Two times a day (BID) | ORAL | Status: DC

## 2015-08-14 MED ORDER — METRONIDAZOLE 500 MG PO TABS: 500.0000 mg | ORAL_TABLET | Freq: Three times a day (TID) | ORAL | Status: DC

## 2015-08-14 NOTE — Progress Notes (Signed)
Subjective:  Patient ID: Laura Vasquez, female    DOB: [birth year]  Age: 69 y.o. MRN: [id number]  CC: No chief complaint on file.   HPI Laura Vasquez presents for anxiety, palpitations f/u. 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 C/o LLQ pain on and off like what she gets w/diverticulitis  Outpatient Prescriptions Prior to Visit  Medication Sig Dispense Refill  . aspirin EC 81 MG tablet Take 81 mg by mouth daily.    . B Complex Vitamins (B COMPLEX PO) Take 1 tablet by mouth daily.    . calcium carbonate (TUMS - DOSED IN MG ELEMENTAL CALCIUM) 500 MG chewable tablet Chew 1 tablet by mouth as needed.     . cholecalciferol (VITAMIN D) 1000 UNITS tablet Take 2,000 Units by mouth daily.     . COD LIVER OIL PO Take by mouth daily.    . cyanocobalamin 500 MCG tablet Take 500 mcg by mouth daily.    . diazepam (VALIUM) 5 MG tablet Take 0.5-1 tablets (2.5-5 mg total) by mouth every 12 (twelve) hours as needed for anxiety. 60 tablet 5  . MAGNESIUM PO Take 800 mg by mouth.     . meclizine (ANTIVERT) 25 MG tablet Take 25 mg by mouth as needed.     . Omega-3 Fatty Acids (FISH OIL PO) Take 1-2 each by mouth daily.     . propranolol (INDERAL) 10 MG tablet take 1 tablet by mouth twice a day 60 tablet 6  . omeprazole (PRILOSEC) 20 MG capsule take 1 capsule by mouth twice a day (Patient taking differently: take 1 capsule by mouth once daily) 60 capsule 5  . propranolol (INDERAL) 10 MG tablet Take 10 mg by mouth daily.      No facility-administered medications prior to visit.    ROS Review of Systems  Constitutional: Negative for chills, activity change, appetite change, fatigue and unexpected weight change.  HENT: Negative for congestion, mouth sores and sinus pressure.   Eyes: Negative for visual disturbance.  Respiratory: Negative for cough and chest tightness.   Gastrointestinal: Negative for nausea, abdominal pain, diarrhea and  abdominal distention.  Genitourinary: Negative for frequency, vaginal bleeding, vaginal discharge, difficulty urinating and vaginal pain.  Musculoskeletal: Negative for back pain and gait problem.  Skin: Negative for pallor and rash.  Neurological: Negative for dizziness, tremors, weakness, numbness and headaches.  Psychiatric/Behavioral: Negative for suicidal ideas, confusion and sleep disturbance. The patient is nervous/anxious.     Objective:  BP 170/100 mmHg  Pulse 83  Wt 179 lb (81.194 kg)  SpO2 99%  BP Readings from Last 3 Encounters:  08/14/15 170/100  07/29/15 144/90  07/01/15 156/86    Wt Readings from Last 3 Encounters:  08/14/15 179 lb (81.194 kg)  07/29/15 181 lb (82.101 kg)  07/01/15 180 lb (81.647 kg)    Physical Exam  Constitutional: She appears well-developed. No distress.  HENT:  Head: Normocephalic.  Right Ear: External ear normal.  Left Ear: External ear normal.  Nose: Nose normal.  Mouth/Throat: Oropharynx is clear and moist.  Eyes: Conjunctivae are normal. Pupils are equal, round, and reactive to light. Right eye exhibits no discharge. Left eye exhibits no discharge.  Neck: Normal range of motion. Neck supple. No JVD present. No tracheal deviation present. No thyromegaly present.  Cardiovascular: Normal rate, regular rhythm and normal heart sounds.   Pulmonary/Chest: No stridor. No respiratory distress. She has no wheezes.  Abdominal: Soft.  Bowel sounds are normal. She exhibits no distension and no mass. There is no tenderness. There is no rebound and no guarding.  Musculoskeletal: She exhibits no edema or tenderness.  Lymphadenopathy:    She has no cervical adenopathy.  Neurological: She displays normal reflexes. No cranial nerve deficit. She exhibits normal muscle tone. Coordination normal.  Skin: No rash noted. No erythema.  Psychiatric: Her behavior is normal. Judgment and thought content normal.  anxious  Lab Results  Component Value Date    WBC 5.0 04/05/2015   HGB 15.2* 04/05/2015   HCT 45.3 04/05/2015   PLT 227.0 04/05/2015   GLUCOSE 96 04/05/2015   CHOL 261* 04/05/2015   TRIG 68.0 04/05/2015   HDL 92.30 04/05/2015   LDLDIRECT 172.9 04/03/2013   LDLCALC 155* 04/05/2015   ALT 27 04/05/2015   AST 28 04/05/2015   NA 140 04/05/2015   K 4.6 04/05/2015   CL 104 04/05/2015   CREATININE 0.84 04/05/2015   BUN 13 04/05/2015   CO2 28 04/05/2015   TSH 2.68 04/05/2015    Mm Digital Screening Bilateral  10/09/2014  CLINICAL DATA:  Screening. EXAM: DIGITAL SCREENING BILATERAL MAMMOGRAM WITH CAD COMPARISON:  Previous exam(s). ACR Breast Density Category b: There are scattered areas of fibroglandular density. FINDINGS: There are no findings suspicious for malignancy. Images were processed with CAD. IMPRESSION: No mammographic evidence of malignancy. A result letter of this screening mammogram will be mailed directly to the patient. RECOMMENDATION: Screening mammogram in one year. (Code:SM-B-01Y) BI-RADS CATEGORY  1: Negative. Electronically Signed   By: Laura Vasquez M.D.   On: 10/09/2014 15:16    Assessment & Plan:   Diagnoses and all orders for this visit:  Vulvitis  Generalized anxiety disorder  Elevated hemoglobin (HCC)  Tachycardia  Diverticulosis of large intestine without hemorrhage  Other orders -     omeprazole (PRILOSEC) 20 MG capsule; Take 1 capsule (20 mg total) by mouth daily. -     metroNIDAZOLE (FLAGYL) 500 MG tablet; Take 1 tablet (500 mg total) by mouth 3 (three) times daily. -     ciprofloxacin (CIPRO) 500 MG tablet; Take 1 tablet (500 mg total) by mouth 2 (two) times daily.  I have changed Laura Vasquez's omeprazole. I am also having her start on metroNIDAZOLE and ciprofloxacin. Additionally, I am having her maintain her Omega-3 Fatty Acids (FISH OIL PO), cholecalciferol, MAGNESIUM PO, calcium carbonate, aspirin EC, meclizine, cyanocobalamin, COD LIVER OIL PO, diazepam, B Complex Vitamins (B COMPLEX PO),  propranolol, and nystatin ointment.  Meds ordered this encounter  Medications  . omeprazole (PRILOSEC) 20 MG capsule    Sig: Take 1 capsule (20 mg total) by mouth daily.    Dispense:  30 capsule    Refill:  11  . nystatin ointment (MYCOSTATIN)    Sig: Reported on 08/14/2015    Refill:  0  . metroNIDAZOLE (FLAGYL) 500 MG tablet    Sig: Take 1 tablet (500 mg total) by mouth 3 (three) times daily.    Dispense:  30 tablet    Refill:  0  . ciprofloxacin (CIPRO) 500 MG tablet    Sig: Take 1 tablet (500 mg total) by mouth 2 (two) times daily.    Dispense:  20 tablet    Refill:  0     Follow-up: Return in about 6 months (around 02/12/2016) for Wellness Exam.  Walker Kehr, MD

## 2015-08-14 NOTE — Assessment & Plan Note (Signed)
Chronic Diazepam prn  Potential benefits of a long term benzodiazepines  use as well as potential risks  and complications were explained to the patient and were aknowledged. 

## 2015-08-14 NOTE — Assessment & Plan Note (Signed)
vulvitis dx'd by Dr Henley(s/p hysterectomy) - he was concerned of a colo-vaginal fistula 08/07/15 Pt has an appt w/Dr Germain Osgood

## 2015-08-14 NOTE — Assessment & Plan Note (Signed)
Labs

## 2015-08-14 NOTE — Progress Notes (Signed)
Pre visit review using our clinic review tool, if applicable. No additional management support is needed unless otherwise documented below in the visit note. 

## 2015-08-14 NOTE — Assessment & Plan Note (Signed)
Dr Acie Fredrickson Propranolol prn

## 2015-08-14 NOTE — Assessment & Plan Note (Signed)
Flagyl and Cipro if worse

## 2015-08-21 ENCOUNTER — Telehealth: Payer: Self-pay | Admitting: Internal Medicine

## 2015-08-21 NOTE — Telephone Encounter (Signed)
Correction: Pt is on Flagyl and cipro - d/c flagyl and cont Cipro Thx

## 2015-08-21 NOTE — Telephone Encounter (Signed)
Patient called to follow up. Advised that there is no answer at this point. Patient also wanted to add that she did take her first dose today and states that she fells it time sensitive.

## 2015-08-21 NOTE — Telephone Encounter (Signed)
After speaking to pt- she is unsure of which medication is causing her symptoms. I advised PCP of this. He advises pt to disregard his message below and continue Cipro and D/C Flagyl. Pt informed of this and to call us if her symptoms do not resolve after stopping Flagyl. Pt voiced understanding and agrees.

## 2015-08-21 NOTE — Telephone Encounter (Signed)
Pt call back stated she spoke with the pharmacy and they advise for her to continue to take this med until she hear from Dr. Camila Vasquez (that is what is doing right now). Please give her a call on what to do about this, she want to hear from Dr. Camila Vasquez.

## 2015-08-21 NOTE — Telephone Encounter (Signed)
Pt started taking her Cipro about 3 days ago and she is having some diarrhea, waves of nausea, little bit of stomach pains, a little light headed and bad dreams. Her bp is fine. She states she wants to keep taking it but wants to see if you think she can or if she should stop taking this.  She states she's called early this morning before she takes her doses for today to see what you want to do. She can be reached at [phone]

## 2015-08-21 NOTE — Telephone Encounter (Signed)
OK to d/c Cipro thx

## 2015-08-26 ENCOUNTER — Ambulatory Visit: Payer: Medicare Other | Admitting: Family Medicine

## 2015-08-30 ENCOUNTER — Encounter: Payer: Self-pay | Admitting: Family Medicine

## 2015-08-30 ENCOUNTER — Ambulatory Visit (INDEPENDENT_AMBULATORY_CARE_PROVIDER_SITE_OTHER): Payer: Medicare Other | Admitting: Family Medicine

## 2015-08-30 VITALS — BP 140/88 | HR 79 | Ht 64.0 in | Wt 182.0 lb

## 2015-08-30 DIAGNOSIS — M25561 Pain in right knee: Secondary | ICD-10-CM | POA: Diagnosis not present

## 2015-08-30 DIAGNOSIS — S8001XD Contusion of right knee, subsequent encounter: Secondary | ICD-10-CM | POA: Diagnosis not present

## 2015-08-30 NOTE — Progress Notes (Signed)
Pre visit review using our clinic review tool, if applicable. No additional management support is needed unless otherwise documented below in the visit note. 

## 2015-08-30 NOTE — Assessment & Plan Note (Signed)
Discussed with patient at great length. Patient does have more of a contusion of the knee that has been 12 weeks. We discussed that she should be nearly 100%. Patient is stating that she is probably closer to 80%. We discussed x-rays today. Patient will consider it but did not want to have them done today. This would allow [place] to know how much underlying arthritis is there. We also discussed the possibility of formal physical therapy or even an injection to decrease any intra-articular inflammation. Patient also declined both of these today. We went over patient's home exercises again. We discussed icing regimen. We discussed continuing with the vitamin supplementations. Patient would like to give it or more weeks before we try any of these other modalities with discussed previously.  Spent  25 minutes with patient face-to-face and had greater than 50% of counseling including as described above in assessment and plan.

## 2015-08-30 NOTE — Progress Notes (Signed)
[facility] [place], [place] [address] Phone: [phone] Subjective:     CC: Knee pain after fall follow-up  [id number] Laura Vasquez is a 70 y.o. female coming in with complaint of knee pain after fall. This is her right knee. Patient fell October 15 at a grocery store.  Patient was found to have more of a patellar contusion. Has continue conservative therapy since then. Patient has been noticing some mild increase in discomfort when going up or down hills but otherwise states that she does not have any pain. Has a sharp ache when she is sitting for long amount of time. Patient denies any swelling, any radiation of pain. States that she continues to improve she would state but very slowly overall. Patient is now over 12 weeks out from the injury. Patient has been doing the home exercises she states religiously.  Past Medical History  Diagnosis Date  . Anxiety   . Hypertension   . Allergy   . GERD (gastroesophageal reflux disease)   . Hyperlipidemia   . Fibromyalgia   . Mitral regurgitation   . Palpitations   . IBS (irritable bowel syndrome)   . Vitamin B12 deficiency   . Hyperplastic colon polyp   . Peptic stricture of esophagus   . Erosive esophagitis   . Osteoarthritis   . Fatty liver   . Arrhythmia    Past Surgical History  Procedure Laterality Date  . Cholecystectomy    . Tubal ligation    . Appendectomy    . Abdominal hysterectomy     Social History  Substance Use Topics  . Smoking status: Former Smoker    Quit date: 08/18/1991  . Smokeless tobacco: Never Used  . Alcohol Use: No     Comment: 1-2 glasses    Allergies  Allergen Reactions  . Amoxicillin-Pot Clavulanate   . Amoxicillin-Pot Clavulanate   . Ceftin [Cefuroxime Axetil]     Lips burning  . Codeine   . Naproxen   . Sulfonamide Derivatives    Family History  Problem Relation Age of Onset  . Hypertension Mother   . Ovarian cancer Mother   .  Hypertension Father   . Ovarian cancer Sister   . Fibromyalgia Sister     x 2  . Colon cancer Neg Hx   . Diabetes Sister         Past medical history, social, surgical and family history all reviewed in electronic medical record.   Review of Systems: No headache, visual changes, nausea, vomiting, diarrhea, constipation, dizziness, abdominal pain, skin rash, fevers, chills, night sweats, weight loss, swollen lymph nodes, body aches, joint swelling, muscle aches, chest pain, shortness of breath, mood changes.   Objective Blood pressure 140/88, pulse 79, height 5\' 4"  (1.626 m), weight 182 lb (82.555 kg), SpO2 97 %.  General: No apparent distress alert and oriented x3 mood and affect normal, dressed appropriately.  HEENT: Pupils equal, extraocular movements intact  Respiratory: Patient's speak in full sentences and does not appear short of breath  Cardiovascular: No lower extremity edema, non tender, no erythema  Skin: Warm dry intact with no signs of infection or rash on extremities or on axial skeleton.  Abdomen: Soft nontender  Neuro: Cranial nerves II through XII are intact, neurovascularly intact in all extremities with 2+ DTRs and 2+ pulses.  Lymph: No lymphadenopathy of posterior or anterior cervical chain or axillae bilaterally.  Gait normal with good balance and coordination.  MSK:  Non tender with full range of motion and good stability and symmetric strength and tone of shoulders, elbows, wrist, hip, and ankles bilaterally.  Knee: Right No effusion noted.  Very minimal tenderness over the patellar but nonspecific. ROM full in flexion and extension and lower leg rotation. Ligaments with solid consistent endpoints including ACL, PCL, LCL, MCL. Negative Mcmurray's, Apley's, and Thessalonian tests. Non painful patellar compression. Patellar glide without crepitus. Patellar and quadriceps tendons unremarkable. Hamstring and quadriceps strength is normal.  Contralateral knee  unremarkable     Impression and Recommendations:     This case required medical decision making of moderate complexity.

## 2015-08-30 NOTE — Patient Instructions (Addendum)
Good to see you Happy New Year! Ice is your friend Can try heat before activity  Try to walk on flat surfaces See me again in 4 weeks and if not better we will do xray and consider either PT or injection.

## 2015-09-11 ENCOUNTER — Encounter: Payer: Self-pay | Admitting: *Deleted

## 2015-09-27 ENCOUNTER — Telehealth: Payer: Self-pay | Admitting: Internal Medicine

## 2015-09-27 ENCOUNTER — Ambulatory Visit (INDEPENDENT_AMBULATORY_CARE_PROVIDER_SITE_OTHER): Payer: Medicare Other | Admitting: Internal Medicine

## 2015-09-27 ENCOUNTER — Encounter: Payer: Self-pay | Admitting: Internal Medicine

## 2015-09-27 VITALS — BP 170/90 | HR 84 | Ht 63.25 in | Wt 181.5 lb

## 2015-09-27 DIAGNOSIS — K5730 Diverticulosis of large intestine without perforation or abscess without bleeding: Secondary | ICD-10-CM

## 2015-09-27 DIAGNOSIS — R1032 Left lower quadrant pain: Secondary | ICD-10-CM | POA: Diagnosis not present

## 2015-09-27 DIAGNOSIS — K219 Gastro-esophageal reflux disease without esophagitis: Secondary | ICD-10-CM

## 2015-09-27 DIAGNOSIS — K589 Irritable bowel syndrome without diarrhea: Secondary | ICD-10-CM

## 2015-09-27 MED ORDER — GLYCOPYRROLATE 2 MG PO TABS: 2.0000 mg | ORAL_TABLET | Freq: Two times a day (BID) | ORAL | Status: DC

## 2015-09-27 MED ORDER — CILIDINIUM-CHLORDIAZEPOXIDE 2.5-5 MG PO CAPS: 1.0000 | ORAL_CAPSULE | Freq: Three times a day (TID) | ORAL | Status: DC | PRN

## 2015-09-27 NOTE — Telephone Encounter (Signed)
I have spoken to patient who states that she really would prefer not to take the librax since it is in the benzodiazepine family and she has read it can become addictive. She would like another alternative. After speaking with Dr Hilarie Fredrickson, he explained that we could try patient on robinul forte twice daily although Librax is the best option for her IBS symptoms and the robinul will not likely be as effective. Patient would like to try the robinul. Rx sent to pharmacy. Patient verbalizes understanding.

## 2015-09-27 NOTE — Telephone Encounter (Signed)
This should be generic Would recommend pricing at [facility] and/or [place] This is likely the best IBS med for her

## 2015-09-27 NOTE — Progress Notes (Signed)
Patient ID: Laura Vasquez, female   DOB: 1946/02/10, 70 y.o.   MRN: [id number] HPI: Laura Vasquez is a 70 year old female with a past medical history of IBS, diverticulosis, GERD, fibromyalgia, hyperlipidemia, hypertension, anxiety who seen in consultation at the request of Dr. Alain Marion to discuss left lower quadrant pain. She is here alone today. She was previously followed by Dr. Velora Heckler and then saw Dr. Sharlett Iles in 2012 and had upper endoscopy and colonoscopy. She has not been seen in the office in over 4 years.  Today she reports that she has been having days of dull left lower quadrant abdominal pain which doesn't relate to bowel movements. This relates to stress and anxiety. She reports regular bowel movements which are often formed and brown and then at other times she'll have soft less formed bowel movements which she states are "harder to clean". She uses Metamucil 2 tablespoons on occasion and with this sheis mild increase in gas but it does seem to make her bowel movements more regular. She has days with absolutely no abdominal pain and other days the pain can be somewhat debilitating. She has not seen blood or melena in her stool. No recent upper abdominal complaint including no issues with heartburn, dysphagia or odynophagia. She does use omeprazole 20 mg daily. She denies change in appetite or early satiety.  She has been seeing GYN recently Dr. Ulanda Edison. She states that she's had some urinary frequency and mild irritation and uncomfortableness after urination particularly in the vulvar area. She was given a cream for this without much help. She states Dr. Ulanda Edison so a "brownish discharge" and told her this was "indistinguishable from stool". This was there on a pelvic examination several months ago but not very recently. She has not had issue with urinary tract infection. She is not having vaginal discharge that she notices. No vaginal bleeding except after her last vaginal exam which resolved. She  states Dr. Ulanda Edison mentioned the possibility of a colovaginal fistula and she wonders if this needs to be checked into.   She she is status post hysterectomy and cholecystectomy. Routine labs were performed in August 2016 which revealed a normal CMP, white count was 5.0 hemoglobin 15.2, MCV 90.8, platelet count normal. TSH normal.  EGD performed 06/24/2011 showed 2 cm hiatal hernia and a benign stricture at the GE junction. Otherwise normal exam. Colonoscopy 06/24/2011 -- diverticulosis mild in the left colon. Diminutive rectal polyp removed found to be hyperplastic. 10 year recall recommended  Past Medical History  Diagnosis Date  . Anxiety   . Hypertension   . Allergy   . GERD (gastroesophageal reflux disease)   . Hyperlipidemia   . Fibromyalgia   . Mitral regurgitation   . Palpitations   . IBS (irritable bowel syndrome)   . Vitamin B12 deficiency   . Hyperplastic colon polyp   . Peptic stricture of esophagus   . Erosive esophagitis   . Osteoarthritis   . Fatty liver   . Arrhythmia   . Diverticulosis   . Hiatal hernia     Past Surgical History  Procedure Laterality Date  . Cholecystectomy    . Tubal ligation    . Appendectomy    . Abdominal hysterectomy    . Rectocele repair      Outpatient Prescriptions Prior to Visit  Medication Sig Dispense Refill  . aspirin EC 81 MG tablet Take 81 mg by mouth daily.    . B Complex Vitamins (B COMPLEX PO) Take 1 tablet by mouth  daily.    . calcium carbonate (TUMS - DOSED IN MG ELEMENTAL CALCIUM) 500 MG chewable tablet Chew 1 tablet by mouth as needed.     . cholecalciferol (VITAMIN D) 1000 UNITS tablet Take 2,000 Units by mouth daily.     . cyanocobalamin 500 MCG tablet Take 500 mcg by mouth daily.    . diazepam (VALIUM) 5 MG tablet Take 0.5-1 tablets (2.5-5 mg total) by mouth every 12 (twelve) hours as needed for anxiety. 60 tablet 5  . MAGNESIUM PO Take 800 mg by mouth.     . meclizine (ANTIVERT) 25 MG tablet Take 25 mg by mouth  as needed.     . nystatin ointment (MYCOSTATIN) Reported on 08/14/2015 AS NEEDED  0  . Omega-3 Fatty Acids (FISH OIL PO) Take 1-2 each by mouth daily.     Marland Kitchen omeprazole (PRILOSEC) 20 MG capsule Take 1 capsule (20 mg total) by mouth daily. 30 capsule 11  . COD LIVER OIL PO Take by mouth daily.    . propranolol (INDERAL) 10 MG tablet take 1 tablet by mouth twice a day (Patient taking differently: take 1/2 tablet by mouth twice a day) 60 tablet 6   No facility-administered medications prior to visit.    Allergies  Allergen Reactions  . Amoxicillin-Pot Clavulanate   . Ceftin [Cefuroxime Axetil]     Lips burning  . Codeine   . Naproxen   . Sulfonamide Derivatives     Family History  Problem Relation Age of Onset  . Hypertension Mother   . Ovarian cancer Mother   . Hypertension Father   . Fibromyalgia Sister     x 2  . Colon cancer Neg Hx   . Diabetes Sister     Social History  Substance Use Topics  . Smoking status: Former Smoker    Quit date: 08/18/1991  . Smokeless tobacco: Never Used  . Alcohol Use: 0.0 oz/week    0 Standard drinks or equivalent per week     Comment: 1-2 glasses     ROS: As per history of present illness, otherwise negative  BP 170/90 mmHg  Pulse 84  Ht 5' 3.25" (1.607 m)  Wt 181 lb 8 oz (82.328 kg)  BMI 31.88 kg/m2 Constitutional: Well-developed and well-nourished. No distress. HEENT: Normocephalic and atraumatic.  Conjunctivae are normal.  No scleral icterus. Neck: Neck supple. Trachea midline. Cardiovascular: Normal rate, regular rhythm and intact distal pulses. No M/R/G Pulmonary/chest: Effort normal and breath sounds normal. No wheezing, rales or rhonchi. Abdominal: Soft, nontender, nondistended. Bowel sounds active throughout. There are no masses palpable. No hepatosplenomegaly. Extremities: no clubbing, cyanosis, or edema Neurological: Alert and oriented to person place and time. Skin: Skin is warm and dry. Psychiatric: Normal mood and  affect. Behavior is normal.  RELEVANT LABS AND IMAGING: CBC    Component Value Date/Time   WBC 5.0 04/05/2015 0828   RBC 4.99 04/05/2015 0828   HGB 15.2* 04/05/2015 0828   HCT 45.3 04/05/2015 0828   PLT 227.0 04/05/2015 0828   MCV 90.8 04/05/2015 0828   MCH 30.2 12/27/2011 0351   MCHC 33.5 04/05/2015 0828   RDW 14.1 04/05/2015 0828   LYMPHSABS 1.9 04/05/2015 0828   MONOABS 0.4 04/05/2015 0828   EOSABS 0.1 04/05/2015 0828   BASOSABS 0.0 04/05/2015 0828    CMP     Component Value Date/Time   NA 140 04/05/2015 0828   K 4.6 04/05/2015 0828   CL 104 04/05/2015 0828   CO2 28 04/05/2015  [id number]   GLUCOSE 96 04/05/2015 0828   BUN 13 04/05/2015 0828   CREATININE 0.84 04/05/2015 0828   CALCIUM 9.7 04/05/2015 0828   PROT 7.6 04/05/2015 0828   ALBUMIN 4.5 04/05/2015 0828   AST 28 04/05/2015 0828   ALT 27 04/05/2015 0828   ALKPHOS 39 04/05/2015 0828   BILITOT 0.8 04/05/2015 0828   GFRNONAA 73* 12/27/2011 0351   GFRAA 85* 12/27/2011 0351    ASSESSMENT/PLAN: 70 year old female with a past medical history of IBS, diverticulosis, GERD, fibromyalgia, hyperlipidemia, hypertension, anxiety who seen in consultation at the request of Dr. Alain Marion to discuss left lower quadrant pain.  1. IBS with LLQ pain -- her symptoms of left lower quadrant discomfort which come and go and are driven by stress and anxiety are very consistent with IBS. We had a very long discussion today regarding irritable bowel, diverticulosis, and diverticulitis. Her symptoms are not consistent with diverticulitis though she is aware diverticulitis is possible. Should she developed sharp left-sided abdominal pain associated with nausea, vomiting, fever or chills she is asked to notify me immediately. She voices understanding. I would like to start her on Benefiber 1 tablespoon daily rather than Metamucil. This should help regulate bowel movements. I'm also going to prescribe Librax 1 tablet 3 times a day on an as-needed  basis only. This should help with lower abdominal pain associated with IBS and also likely be anxiety component which drives the pain. We discussed the risks, benefits and alternatives to Librax. Follow-up in 3-4 months  2. GERD -- stable. No alarm symptoms or history of Barrett's. Continue omeprazole 20 mg daily  3. CRC screening -- up-to-date last colonoscopy 2012. She inquired about FOBT testing which she states was not recommended by Dr. Shellee Milo or Dr. Ulanda Edison.  I agree with this recommendation given that she is already on a colonoscopy protocol for screening.  4.  Vaginal and vulvar irritation -- given her lack of frequent UTI and lack of vaginal discharge I feel colovaginal fistula is very unlikely. She has also not had frequent attacks of diverticulitis making complication such as fistula unlikely. She may have atrophic vaginitis or vulvitis related to postmenopausal state. I encouraged her to discuss this further with GYN.    [id number] [address], [address] [place], [place] [address]

## 2015-09-27 NOTE — Telephone Encounter (Signed)
Dr Pyrtle-please advise.... 

## 2015-09-27 NOTE — Patient Instructions (Signed)
We have sent the following medications to your pharmacy for you to pick up at your convenience: Librax 1 tablet three times daily as needed  Please purchase the following medications over the counter and take as directed: Benefiber 1 tablespoon daily  Please follow up with Dr Norman Herrlich in 3-4 months.

## 2015-10-04 ENCOUNTER — Encounter: Payer: Self-pay | Admitting: Family Medicine

## 2015-10-04 ENCOUNTER — Ambulatory Visit (INDEPENDENT_AMBULATORY_CARE_PROVIDER_SITE_OTHER)
Admission: RE | Admit: 2015-10-04 | Discharge: 2015-10-04 | Disposition: A | Discharge status: Home / Self Care | Payer: Medicare Other | Source: Ambulatory Visit | Attending: Family Medicine | Admitting: Family Medicine

## 2015-10-04 ENCOUNTER — Ambulatory Visit (INDEPENDENT_AMBULATORY_CARE_PROVIDER_SITE_OTHER): Payer: Medicare Other | Admitting: Family Medicine

## 2015-10-04 VITALS — BP 146/84 | HR 73 | Wt 180.0 lb

## 2015-10-04 DIAGNOSIS — M1711 Unilateral primary osteoarthritis, right knee: Secondary | ICD-10-CM | POA: Diagnosis not present

## 2015-10-04 DIAGNOSIS — M25561 Pain in right knee: Secondary | ICD-10-CM

## 2015-10-04 NOTE — Progress Notes (Signed)
[facility] [address] [place], [place] [address] Phone: [phone] Subjective:     CC: Knee pain after fall follow-up  [id number] Laura Vasquez is a 70 y.o. female coming in with complaint of knee pain after fall. This is her right knee. Patient fell October 15 at a grocery store.   patient has had difficulty making progress with this knee. There is a likelihood for patient exacerbating some underlying arthritis from this. Continues to have difficulty with any type of walking on hills. Seems to be getting potentially a little worse. States that it doesn't feel like it's giving out on her but does feel tight. Denies any radiation of the pain down the legs or any numbness. Has not notice any swelling. Patient did do physical therapy but feels like she has benefited as much as she will from this. Continues to have significant discomfort at all times which makes patient frustrated. Patient is concerned that this is the best she is going to get. Starting to affect some of her daily activities..  Past Medical History  Diagnosis Date  . Anxiety   . Hypertension   . Allergy   . GERD (gastroesophageal reflux disease)   . Hyperlipidemia   . Fibromyalgia   . Mitral regurgitation   . Palpitations   . IBS (irritable bowel syndrome)   . Vitamin B12 deficiency   . Hyperplastic colon polyp   . Peptic stricture of esophagus   . Erosive esophagitis   . Osteoarthritis   . Fatty liver   . Arrhythmia   . Diverticulosis   . Hiatal hernia    Past Surgical History  Procedure Laterality Date  . Cholecystectomy    . Tubal ligation    . Appendectomy    . Abdominal hysterectomy    . Rectocele repair     Social History  Substance Use Topics  . Smoking status: Former Smoker    Quit date: 08/18/1991  . Smokeless tobacco: Never Used  . Alcohol Use: 0.0 oz/week    0 Standard drinks or equivalent per week     Comment: 1-2 glasses    Allergies  Allergen  Reactions  . Amoxicillin-Pot Clavulanate   . Ceftin [Cefuroxime Axetil]     Lips burning  . Codeine   . Naproxen   . Sulfonamide Derivatives    Family History  Problem Relation Age of Onset  . Hypertension Mother   . Ovarian cancer Mother   . Hypertension Father   . Fibromyalgia Sister     x 2  . Colon cancer Neg Hx   . Diabetes Sister         Past medical history, social, surgical and family history all reviewed in electronic medical record.   Review of Systems: No headache, visual changes, nausea, vomiting, diarrhea, constipation, dizziness, abdominal pain, skin rash, fevers, chills, night sweats, weight loss, swollen lymph nodes, body aches, joint swelling, muscle aches, chest pain, shortness of breath, mood changes.   Objective Blood pressure 146/84, pulse 73, weight 180 lb (81.647 kg), SpO2 95 %.  General: No apparent distress alert and oriented x3 mood and affect normal, dressed appropriately.  HEENT: Pupils equal, extraocular movements intact  Respiratory: Patient's speak in full sentences and does not appear short of breath  Cardiovascular: No lower extremity edema, non tender, no erythema  Skin: Warm dry intact with no signs of infection or rash on extremities or on axial skeleton.  Abdomen: Soft nontender  Neuro: Cranial nerves  II through XII are intact, neurovascularly intact in all extremities with 2+ DTRs and 2+ pulses.  Lymph: No lymphadenopathy of posterior or anterior cervical chain or axillae bilaterally.  Gait mild antalgic which is new.  MSK:  Non tender with full range of motion and good stability and symmetric strength and tone of shoulders, elbows, wrist, hip, and ankles bilaterally.  Knee: Right No effusion noted.  Very minimal tenderness over the patellar but nonspecific. More tenderness  Over medial joint line from previous exam.  ROM full in flexion and extension and lower leg rotation. Ligaments with solid consistent endpoints including ACL, PCL,  LCL, MCL. Negative Mcmurray's, Apley's, and Thessalonian tests. Non painful patellar compression. Patellar glide without crepitus. Patellar and quadriceps tendons unremarkable. Hamstring and quadriceps strength is normal.  Contralateral knee unremarkable  After obtaining consent, prepped in sterile fashion, , injection o f4 cc of 0.5% marcaine and 1 cc of Kenalog 40mg /dl in right knee from lateral approach given by Hulan Saas, M. No blood loss, band aid  Placed.  Post injections instructions given..    Impression and Recommendations:     This case required medical decision making of moderate complexity.

## 2015-10-04 NOTE — Assessment & Plan Note (Addendum)
Discussed with patient at great length today. We discussed that her knee is likely an exacerbation of the underlying condition of the moderate to severe arthritic changes in her knee. X-rays are pending at this moment. We discussed that the patellar contusion seemed to have healed multiple weeks ago. Patient does have a history of fibromyalgia and I do think that this could be exacerbating the situation. Patient elected to try an injection today. Hopefully this will be beneficial. We discussed the possibility of formal physical therapy and we did refer her today. Hopefully that this will be helpful as well. We discussed that this is the arthritis that certain things such as viscous supplementation or even bracing could be beneficial. Patient is very frustrated at this and still feels that the injury that occurred back in October is what has caused this. We discussed that we can get advance imaging but due to the amount of arthritis in her knee likely any arthroscopic procedure would not be possible. Patient will come back and see me again in 4 weeks.  Spent  25 minutes with patient face-to-face and had greater than 50% of counseling including as described above in assessment and plan.

## 2015-10-04 NOTE — Patient Instructions (Signed)
Good to see you  Ice 20 minutes 2 times daily. Usually after activity and before bed. We tried an injection today  We will get xray today to make sure nothing else is going on.  Physical therapy will be calling you  If this the arthritis we will have some difficulty and will have flares but we shouldbe able to get a little better If we need we have other injections that can help as well.  And we can even get a custom brace See me again in 4 weeks.

## 2015-10-14 ENCOUNTER — Telehealth: Payer: Self-pay | Admitting: Internal Medicine

## 2015-10-14 NOTE — Telephone Encounter (Signed)
Patient is very upset because our office is booked for two days.  [facility] and [facility]isit.  Patient only wanted to go to [facility] location but that office is closed and [place] is too far. She states she does not use the computer and she does not want to go to another office location.  Even with this said she mentioned going to a urgent care but does not want to.  I have schedule her with Terri Piedra for Wed at 1:30 but she is upset because he is a NP.  She is demanding a ZPak as soon as possible because she has a sinus infection.  States she has to take care of someone who is having surgery and does not want to wait to be seen.  Please advise.

## 2015-10-14 NOTE — Telephone Encounter (Signed)
I could work her in tomorrow 11:45 if needed Thx

## 2015-10-14 NOTE — Telephone Encounter (Signed)
Patient got seen somewhere else today.

## 2015-10-16 ENCOUNTER — Ambulatory Visit: Payer: Medicare Other | Admitting: Family

## 2015-10-28 ENCOUNTER — Encounter: Payer: Self-pay | Admitting: Physical Therapy

## 2015-10-28 ENCOUNTER — Encounter: Payer: Self-pay | Admitting: Gastroenterology

## 2015-10-28 ENCOUNTER — Ambulatory Visit: Payer: Medicare Other | Attending: Family Medicine | Admitting: Physical Therapy

## 2015-10-28 DIAGNOSIS — R262 Difficulty in walking, not elsewhere classified: Secondary | ICD-10-CM | POA: Diagnosis present

## 2015-10-28 DIAGNOSIS — M25561 Pain in right knee: Secondary | ICD-10-CM | POA: Insufficient documentation

## 2015-10-28 DIAGNOSIS — M7989 Other specified soft tissue disorders: Secondary | ICD-10-CM | POA: Diagnosis present

## 2015-10-28 NOTE — Therapy (Signed)
[place] [address] [place], [place], [address] Phone: [phone]   Fax:  [phone]  Physical Therapy Evaluation  Patient Details  Name: Laura Vasquez MRN: [id number] Date of Birth: 02-09-1946 Referring Provider: Creig Hines  Encounter Date: 10/28/2015      PT End of Session - 10/28/15 1603    Visit Number 1   Date for PT Re-Evaluation 12/28/15   PT Start Time 1527   PT Stop Time 1630   PT Time Calculation (min) 63 min   Activity Tolerance Patient tolerated treatment well   Behavior During Therapy [facility] for tasks assessed/performed      Past Medical History  Diagnosis Date  . Anxiety   . Hypertension   . Allergy   . GERD (gastroesophageal reflux disease)   . Hyperlipidemia   . Fibromyalgia   . Mitral regurgitation   . Palpitations   . IBS (irritable bowel syndrome)   . Vitamin B12 deficiency   . Hyperplastic colon polyp   . Peptic stricture of esophagus   . Erosive esophagitis   . Osteoarthritis   . Fatty liver   . Arrhythmia   . Diverticulosis   . Hiatal hernia     Past Surgical History  Procedure Laterality Date  . Cholecystectomy    . Tubal ligation    . Appendectomy    . Abdominal hysterectomy    . Rectocele repair      There were no vitals filed for this visit.  Visit Diagnosis:  Right knee pain - Plan: PT plan of care cert/re-cert  Difficulty walking - Plan: PT plan of care cert/re-cert  Swelling of limb - Plan: PT plan of care cert/re-cert      Subjective Assessment - 10/28/15 1533    Subjective Patient reports that she slipped at the grocery store on grapes that were in the floor.  She reports that she hit her right knee.  This occured June 01, 2015.  She reports that she has continued to have significant pain and difficulty walking   Limitations Standing;Walking   Patient Stated Goals have less pain   Currently in Pain? Yes   Pain Score 2    Pain Location Knee   Pain  Orientation Right;Anterior   Pain Descriptors / Indicators Aching   Pain Type Chronic pain   Pain Onset More than a month ago   Pain Frequency Constant   Aggravating Factors  worse with walking pain up to 8/10, reports that she has tingling in the knee and in the leg, Reports "the leg does not feel like it will support me."   Pain Relieving Factors rest   Effect of Pain on Daily Activities Just does not feel good            [facility] PT Assessment - 10/28/15 0001    Assessment   Medical Diagnosis right knee pain   Referring Provider Z. Smith   Onset Date/Surgical Date 06/01/15   Prior Therapy no   Precautions   Precautions None   Balance Screen   Has the patient fallen in the past 6 months Yes   How many times? 1   Has the patient had a decrease in activity level because of a fear of falling?  No   Is the patient reluctant to leave their home because of a fear of falling?  No   Home Environment   Additional Comments steps into the home, some incline to walk up to  the door, some yardwork and housework   Prior Function   Level of Independence Independent   Vocation Retired   Leisure no exercise   AROM   AROM Assessment Site Knee   Right/Left Knee Right   Right Knee Extension 0   Right Knee Flexion 108   Strength   Overall Strength Comments 4/5   Flexibility   Soft Tissue Assessment /Muscle Length --  tight calves and HS and ITB   Ambulation/Gait   Gait Comments antalgic on the left with toe out gait                   [facility] Adult PT Treatment/Exercise - 2015/11/25 0001    Modalities   Modalities Electrical Stimulation   Electrical Stimulation   Electrical Stimulation Location right knee   Electrical Stimulation Action IFC   Electrical Stimulation Parameters tolerance in elevation   Electrical Stimulation Goals Pain                PT Education - 25-Nov-2015 1603    Education provided Yes   Education Details VMO exercises, ITB and HS stretches    Person(s) Educated Patient   Methods Explanation;Demonstration;Tactile cues;Handout   Comprehension Verbalized understanding;Returned demonstration          PT Short Term Goals - Nov 25, 2015 1608    PT SHORT TERM GOAL #1   Title independent with initial HEP   Time 2   Period Weeks   Status New           PT Long Term Goals - 11-25-2015 1608    PT LONG TERM GOAL #1   Title report pain decreased 50%   Time 8   Period Weeks   Status New   PT LONG TERM GOAL #2   Title increase ROM of knee flexion to 115 degrees   Time 8   Period Weeks   Status New   PT LONG TERM GOAL #3   Title report no instances of the knee giving way   Time 8   Period Weeks   Status New               Plan - 11-25-15 1604    Clinical Impression Statement Patient with a fall in the grocery store in October, x-rays and a musculoskeletal US showed a bone bruise and some degenerative changes.  She has tight calves, HS and ITB possibly due to favoring the leg.  Has some limitations with ROM for flexion.  Mild tenderness anterior and laterally, she c/o "the hinge" does not feel right and that it feels "out of line"   Pt will benefit from skilled therapeutic intervention in order to improve on the following deficits Abnormal gait;Decreased mobility;Decreased range of motion;Difficulty walking;Increased edema;Impaired flexibility;Increased muscle spasms;Pain   Rehab Potential Good   PT Frequency 2x / week   PT Duration 8 weeks   PT Treatment/Interventions ADLs/Self Care Home Management;Electrical Stimulation;Iontophoresis 4mg /ml Dexamethasone;Moist Heat;Cryotherapy;Therapeutic exercise;Manual techniques;Vasopneumatic Device;Therapeutic activities;Functional mobility training;Stair training;Gait training;Ultrasound   PT Next Visit Plan May try [place], tape and exercises   Consulted and Agree with Plan of Care Patient          G-Codes - 11/25/2015 1610    Functional Assessment Tool Used Foto 70% limitation    Functional Limitation Mobility: Walking and moving around   Mobility: Walking and Moving Around Current Status [id number]) At least 60 percent but less than 80 percent impaired, limited or restricted   Mobility: Walking and Moving Around Goal  Status [phone]) At least 40 percent but less than 60 percent impaired, limited or restricted       Problem List Patient Active Problem List   Diagnosis Date Noted  . Degenerative arthritis of right knee 10/04/2015  . Vulvitis 08/14/2015  . Patellar contusion 06/11/2015  . Snoring 11/08/2013  . Elevated hemoglobin ([place]) 10/11/2013  . LLQ abdominal pain 10/10/2013  . Well adult exam 03/25/2012  . Cough 02/03/2012  . Acute sinus infection 01/23/2012  . Vertigo 01/08/2012  . Diverticulosis of large intestine 06/24/2011  . Special screening for malignant neoplasms, colon 06/24/2011  . DIASTOLIC DYSFUNCTION [id number]  . Palpitations 12/16/2009  . ALLERGIC RHINITIS 11/21/2009  . Generalized anxiety disorder 04/29/2009  . Hemoptysis 04/26/2009  . FATIGUE 09/04/2008  . GRIEF REACTION 06/01/2008  . PHARYNGITIS 01/24/2008  . ARM PAIN, LEFT 01/24/2008  . CERVICAL STRAIN 01/24/2008  . Essential hypertension 11/14/2007  . ELEVATED BP 11/14/2007  . POLYP, COLON 11/04/2007  . OBESITY 11/04/2007  . EROSIVE ESOPHAGITIS 11/04/2007  . GERD 11/04/2007  . PEPTIC STRICTURE 11/04/2007  . UPPER RESPIRATORY INFECTION (URI) 07/12/2007  . LOW BACK PAIN 07/12/2007  . SWEATING 07/12/2007  . Tachycardia 07/12/2007  . COLONIC POLYPS, HX OF 07/12/2007  . B12 deficiency 05/26/2007  . HYPERLIPIDEMIA 05/26/2007  . MITRAL VALVE PROLAPSE 05/26/2007  . Irritable bowel syndrome 05/26/2007  . MENOPAUSAL SYNDROME 05/26/2007  . FIBROMYALGIA 05/26/2007    Sumner Boast., PT 10/28/2015, 4:37 PM  [address] [place], [place], [address] Phone: [phone]   Fax:  [phone]  Name: Laura Vasquez MRN: [id number] Date of Birth: 1946-07-21

## 2015-10-31 ENCOUNTER — Ambulatory Visit: Payer: Medicare Other | Admitting: Family Medicine

## 2015-11-01 ENCOUNTER — Encounter: Payer: Self-pay | Admitting: Physical Therapy

## 2015-11-01 ENCOUNTER — Ambulatory Visit: Payer: Medicare Other | Admitting: Physical Therapy

## 2015-11-01 DIAGNOSIS — R262 Difficulty in walking, not elsewhere classified: Secondary | ICD-10-CM

## 2015-11-01 DIAGNOSIS — M25561 Pain in right knee: Secondary | ICD-10-CM

## 2015-11-01 NOTE — Therapy (Signed)
[place], [place], [address] Phone: [phone]   Fax:  [phone]  Physical Therapy Treatment  Patient Details  Name: Laura Vasquez MRN: [id number] Date of Birth: 19-Oct-1945 Referring Provider: Creig Hines  Encounter Date: 11/01/2015      PT End of Session - 11/01/15 1126    Visit Number 2   Date for PT Re-Evaluation 12/28/15   PT Start Time 1017   PT Stop Time 1115   PT Time Calculation (min) 58 min      Past Medical History  Diagnosis Date  . Anxiety   . Hypertension   . Allergy   . GERD (gastroesophageal reflux disease)   . Hyperlipidemia   . Fibromyalgia   . Mitral regurgitation   . Palpitations   . IBS (irritable bowel syndrome)   . Vitamin B12 deficiency   . Hyperplastic colon polyp   . Peptic stricture of esophagus   . Erosive esophagitis   . Osteoarthritis   . Fatty liver   . Arrhythmia   . Diverticulosis   . Hiatal hernia     Past Surgical History  Procedure Laterality Date  . Cholecystectomy    . Tubal ligation    . Appendectomy    . Abdominal hysterectomy    . Rectocele repair      There were no vitals filed for this visit.  Visit Diagnosis:  Right knee pain  Difficulty walking      Subjective Assessment - 11/01/15 1118    Subjective doing HEP except stretches. using "massager" at home with sock for padding to protect skin. No real pain just "hinge" feeling with each step.   Currently in Pain? Yes   Pain Score 1    Pain Location Knee   Pain Orientation Right                         [facility] Adult PT Treatment/Exercise - 11/01/15 0001    Exercises   Exercises Knee/Hip   Knee/Hip Exercises: Machines for Strengthening   Cybex Leg Press 20# 2 sets 10  ball squeeze for VMO   Knee/Hip Exercises: Standing   Other Standing Knee Exercises red tband hip flex 2 sets 10  1 set toes fwd,1 set toes out   Knee/Hip Exercises: Seated   Long Arc  Quad Strengthening;Right;2 sets;10 reps  with ball queeze. 1 set 3#, 1 set red tband   Electrical Stimulation   Electrical Stimulation Location right knee  with ice pack   Electrical Stimulation Action IFC   Electrical Stimulation Goals Pain                PT Education - 11/01/15 1126    Education provided Yes   Education Details VMO ther ex with tband. Extensive explanation about VMO activation and patellar tracking issues.   Person(s) Educated Patient   Methods Explanation;Demonstration   Comprehension Verbalized understanding;Returned demonstration          PT Short Term Goals - 10/28/15 1608    PT SHORT TERM GOAL #1   Title independent with initial HEP   Time 2   Period Weeks   Status New           PT Long Term Goals - 10/28/15 1608    PT LONG TERM GOAL #1   Title report pain decreased 50%   Time 8   Period Weeks   Status New  PT LONG TERM GOAL #2   Title increase ROM of knee flexion to 115 degrees   Time 8   Period Weeks   Status New   PT LONG TERM GOAL #3   Title report no instances of the knee giving way   Time 8   Period Weeks   Status New               Plan - 11/01/15 1128    Clinical Impression Statement tolerated ther ex well and issued VMO for HEP, handout and red tband. Pt needed reassurance and education abut VMO activationa dn tracking.    PT Next Visit Plan assess HEP, add VMO ther ex and stretching        Problem List Patient Active Problem List   Diagnosis Date Noted  . Degenerative arthritis of right knee 10/04/2015  . Vulvitis 08/14/2015  . Patellar contusion 06/11/2015  . Snoring 11/08/2013  . Elevated hemoglobin ([place]) 10/11/2013  . LLQ abdominal pain 10/10/2013  . Well adult exam 03/25/2012  . Cough 02/03/2012  . Acute sinus infection 01/23/2012  . Vertigo 01/08/2012  . Diverticulosis of large intestine 06/24/2011  . Special screening for malignant neoplasms, colon 06/24/2011  . DIASTOLIC DYSFUNCTION  [id number]  . Palpitations 12/16/2009  . ALLERGIC RHINITIS 11/21/2009  . Generalized anxiety disorder 04/29/2009  . Hemoptysis 04/26/2009  . FATIGUE 09/04/2008  . GRIEF REACTION 06/01/2008  . PHARYNGITIS 01/24/2008  . ARM PAIN, LEFT 01/24/2008  . CERVICAL STRAIN 01/24/2008  . Essential hypertension 11/14/2007  . ELEVATED BP 11/14/2007  . POLYP, COLON 11/04/2007  . OBESITY 11/04/2007  . EROSIVE ESOPHAGITIS 11/04/2007  . GERD 11/04/2007  . PEPTIC STRICTURE 11/04/2007  . UPPER RESPIRATORY INFECTION (URI) 07/12/2007  . LOW BACK PAIN 07/12/2007  . SWEATING 07/12/2007  . Tachycardia 07/12/2007  . COLONIC POLYPS, HX OF 07/12/2007  . B12 deficiency 05/26/2007  . HYPERLIPIDEMIA 05/26/2007  . MITRAL VALVE PROLAPSE 05/26/2007  . Irritable bowel syndrome 05/26/2007  . MENOPAUSAL SYNDROME 05/26/2007  . FIBROMYALGIA 05/26/2007    Assyria Morreale,ANGIE PTA 11/01/2015, 11:30 AM  [place], [place], [address] Phone: [phone]   Fax:  [phone]  Name: NAAVAH MESENBRINK MRN: [id number] Date of Birth: Oct 01, 1945

## 2015-11-05 ENCOUNTER — Ambulatory Visit: Payer: Medicare Other | Admitting: Physical Therapy

## 2015-11-05 ENCOUNTER — Encounter: Payer: Self-pay | Admitting: Physical Therapy

## 2015-11-05 DIAGNOSIS — R262 Difficulty in walking, not elsewhere classified: Secondary | ICD-10-CM

## 2015-11-05 DIAGNOSIS — M25561 Pain in right knee: Secondary | ICD-10-CM | POA: Diagnosis not present

## 2015-11-05 NOTE — Therapy (Signed)
[place], [place], [address] Phone: [phone]   Fax:  [phone]  Physical Therapy Treatment  Patient Details  Name: Laura Vasquez MRN: [id number] Date of Birth: 1945-12-21 Referring Provider: Creig Hines  Encounter Date: 11/05/2015      PT End of Session - 11/05/15 1102    Visit Number 3   Date for PT Re-Evaluation 12/28/15   PT Start Time 1020   PT Stop Time 1115   PT Time Calculation (min) 55 min      Past Medical History  Diagnosis Date  . Anxiety   . Hypertension   . Allergy   . GERD (gastroesophageal reflux disease)   . Hyperlipidemia   . Fibromyalgia   . Mitral regurgitation   . Palpitations   . IBS (irritable bowel syndrome)   . Vitamin B12 deficiency   . Hyperplastic colon polyp   . Peptic stricture of esophagus   . Erosive esophagitis   . Osteoarthritis   . Fatty liver   . Arrhythmia   . Diverticulosis   . Hiatal hernia     Past Surgical History  Procedure Laterality Date  . Cholecystectomy    . Tubal ligation    . Appendectomy    . Abdominal hysterectomy    . Rectocele repair      There were no vitals filed for this visit.  Visit Diagnosis:  Right knee pain  Difficulty walking      Subjective Assessment - 11/05/15 1019    Subjective doing HEP some,traveled over weekend so limited. No real difference. "chill goes down leg" at times   Currently in Pain? No/denies                         [facility] Adult PT Treatment/Exercise - 11/05/15 0001    Knee/Hip Exercises: Aerobic   Nustep L 4 6 min   Knee/Hip Exercises: Machines for Strengthening   Cybex Knee Extension 10# 2 sets 10   with ball squeeze   Cybex Leg Press 30# 2 sets 10  with ball squeeze   Knee/Hip Exercises: Standing   Hip Flexion Stengthening;Right;2 sets;15 reps  10# pulley 1 set toes fwd , 1 set toes out   Hip ADduction Strengthening;Right;15 reps   Wall Squat 1 set;5  reps;5 seconds  with ball squeeze   Knee/Hip Exercises: Supine   Quad Sets Strengthening;Right;1 set;15 reps  With SLR 2#   Straight Leg Raise with External Rotation Limitations 2# 15 times   Other Supine Knee/Hip Exercises add ball squeze with bridge 15 times   Other Supine Knee/Hip Exercises bridge with ball under feet 15 times   Modalities   Modalities Ultrasound   Electrical Stimulation   Electrical Stimulation Location right knee  ice pack, supine   Electrical Stimulation Action IFC   Electrical Stimulation Goals Pain   Ultrasound   Ultrasound Location RT knee   Ultrasound Parameters 53mHz 1.2 w/cm2   Ultrasound Goals Pain                  PT Short Term Goals - 11/05/15 1105    PT SHORT TERM GOAL #1   Title independent with initial HEP   Status Achieved           PT Long Term Goals - 10/28/15 1608    PT LONG TERM GOAL #1   Title report pain decreased 50%   Time 8  Period Weeks   Status New   PT LONG TERM GOAL #2   Title increase ROM of knee flexion to 115 degrees   Time 8   Period Weeks   Status New   PT LONG TERM GOAL #3   Title report no instances of the knee giving way   Time 8   Period Weeks   Status New               Plan - 11/05/15 1103    Clinical Impression Statement pt with cuing for ther ex and VMO activation, c/o LBP with standing ther ex, but no knee pain, mild crepitus with wall sit. Pt tends to repeat same questions and asks about doing multi other activites  and ther ex ( elliptical)- asked pt to focus on HEP given for VMO strength.   PT Next Visit Plan check HEP compliance ( pt needs to be more diligent in doing 2-3 times per day for VMO activation). VMO strength and stretching.        Problem List Patient Active Problem List   Diagnosis Date Noted  . Degenerative arthritis of right knee 10/04/2015  . Vulvitis 08/14/2015  . Patellar contusion 06/11/2015  . Snoring 11/08/2013  . Elevated hemoglobin ([place]) 10/11/2013   . LLQ abdominal pain 10/10/2013  . Well adult exam 03/25/2012  . Cough 02/03/2012  . Acute sinus infection 01/23/2012  . Vertigo 01/08/2012  . Diverticulosis of large intestine 06/24/2011  . Special screening for malignant neoplasms, colon 06/24/2011  . DIASTOLIC DYSFUNCTION [id number]  . Palpitations 12/16/2009  . ALLERGIC RHINITIS 11/21/2009  . Generalized anxiety disorder 04/29/2009  . Hemoptysis 04/26/2009  . FATIGUE 09/04/2008  . GRIEF REACTION 06/01/2008  . PHARYNGITIS 01/24/2008  . ARM PAIN, LEFT 01/24/2008  . CERVICAL STRAIN 01/24/2008  . Essential hypertension 11/14/2007  . ELEVATED BP 11/14/2007  . POLYP, COLON 11/04/2007  . OBESITY 11/04/2007  . EROSIVE ESOPHAGITIS 11/04/2007  . GERD 11/04/2007  . PEPTIC STRICTURE 11/04/2007  . UPPER RESPIRATORY INFECTION (URI) 07/12/2007  . LOW BACK PAIN 07/12/2007  . SWEATING 07/12/2007  . Tachycardia 07/12/2007  . COLONIC POLYPS, HX OF 07/12/2007  . B12 deficiency 05/26/2007  . HYPERLIPIDEMIA 05/26/2007  . MITRAL VALVE PROLAPSE 05/26/2007  . Irritable bowel syndrome 05/26/2007  . MENOPAUSAL SYNDROME 05/26/2007  . FIBROMYALGIA 05/26/2007    Koryn Charlot,ANGIE PTA 11/05/2015, 11:09 AM  [place] [address] [place], [place], [address] Phone: [phone]   Fax:  [phone]  Name: Laura Vasquez MRN: [id number] Date of Birth: 1945-10-24

## 2015-11-07 ENCOUNTER — Ambulatory Visit: Payer: Medicare Other | Admitting: Physical Therapy

## 2015-11-07 ENCOUNTER — Encounter: Payer: Self-pay | Admitting: Physical Therapy

## 2015-11-07 DIAGNOSIS — R262 Difficulty in walking, not elsewhere classified: Secondary | ICD-10-CM

## 2015-11-07 DIAGNOSIS — M7989 Other specified soft tissue disorders: Secondary | ICD-10-CM

## 2015-11-07 DIAGNOSIS — M25561 Pain in right knee: Secondary | ICD-10-CM

## 2015-11-07 NOTE — Therapy (Signed)
[place], [place], [address] Phone: [phone]   Fax:  [phone]  Physical Therapy Treatment  Patient Details  Name: Laura Vasquez MRN: [id number] Date of Birth: [birth year] Referring Provider: Creig Hines  Encounter Date: 11/07/2015      PT End of Session - 11/07/15 0930    Visit Number 4   Date for PT Re-Evaluation 12/28/15   PT Start Time 0840   PT Stop Time 0946   PT Time Calculation (min) 66 min   Activity Tolerance Patient tolerated treatment well   Behavior During Therapy [facility] for tasks assessed/performed      Past Medical History  Diagnosis Date  . Anxiety   . Hypertension   . Allergy   . GERD (gastroesophageal reflux disease)   . Hyperlipidemia   . Fibromyalgia   . Mitral regurgitation   . Palpitations   . IBS (irritable bowel syndrome)   . Vitamin B12 deficiency   . Hyperplastic colon polyp   . Peptic stricture of esophagus   . Erosive esophagitis   . Osteoarthritis   . Fatty liver   . Arrhythmia   . Diverticulosis   . Hiatal hernia     Past Surgical History  Procedure Laterality Date  . Cholecystectomy    . Tubal ligation    . Appendectomy    . Abdominal hysterectomy    . Rectocele repair      There were no vitals filed for this visit.  Visit Diagnosis:  Right knee pain  Difficulty walking  Swelling of limb      Subjective Assessment - 11/07/15 0843    Subjective Patient reports she has not exercises since she was here last, she reports that her back had some pain doing the exercises when she was here with [place].  She reports that she does not want to hurt something else to get better.   Currently in Pain? Yes   Pain Score 2    Pain Location Back   Pain Orientation Lower   Aggravating Factors  pain in the knee with walking   Pain Relieving Factors rest                         [facility] Adult PT Treatment/Exercise - 11/07/15 0001    High  Level Balance   High Level Balance Comments resisted gait fwd and backward   Knee/Hip Exercises: Aerobic   Nustep L 4 6 min   Knee/Hip Exercises: Machines for Strengthening   Cybex Knee Extension 10# 2 sets 10   with ball squeeze   Cybex Leg Press 30# 2 sets 10  with ball squeeze   Knee/Hip Exercises: Supine   Quad Sets Strengthening;Right;1 set;15 reps   Other Supine Knee/Hip Exercises add ball squeze with bridge 15 times   Other Supine Knee/Hip Exercises bridge with ball under feet 15 times   Modalities   Modalities Iontophoresis   Electrical Stimulation   Electrical Stimulation Location right knee   Electrical Stimulation Action IFC   Electrical Stimulation Goals Pain   Ultrasound   Ultrasound Location right knee   Ultrasound Parameters 1MHz 1.3w/cm   Ultrasound Goals Pain   Iontophoresis   Type of Iontophoresis Dexamethasone   Location right medial knee   Dose 25mA   Time 4 hour patch                  PT Short Term Goals -  11/05/15 1105    PT SHORT TERM GOAL #1   Title independent with initial HEP   Status Achieved           PT Long Term Goals - 10/28/15 1608    PT LONG TERM GOAL #1   Title report pain decreased 50%   Time 8   Period Weeks   Status New   PT LONG TERM GOAL #2   Title increase ROM of knee flexion to 115 degrees   Time 8   Period Weeks   Status New   PT LONG TERM GOAL #3   Title report no instances of the knee giving way   Time 8   Period Weeks   Status New               Plan - 11/07/15 0931    Clinical Impression Statement Patient has very tight HS and will have some cramping in the HS at times with bridges.  She is doing a good job of getting quad and more specifically VMO contraction.  Still sore   PT Next Visit Plan see if ionto helped   Consulted and Agree with Plan of Care Patient        Problem List Patient Active Problem List   Diagnosis Date Noted  . Degenerative arthritis of right knee 10/04/2015  .  Vulvitis 08/14/2015  . Patellar contusion 06/11/2015  . Snoring 11/08/2013  . Elevated hemoglobin ([place]) 10/11/2013  . LLQ abdominal pain 10/10/2013  . Well adult exam 03/25/2012  . Cough 02/03/2012  . Acute sinus infection 01/23/2012  . Vertigo 01/08/2012  . Diverticulosis of large intestine 06/24/2011  . Special screening for malignant neoplasms, colon 06/24/2011  . DIASTOLIC DYSFUNCTION 123XX123  . Palpitations 12/16/2009  . ALLERGIC RHINITIS 11/21/2009  . Generalized anxiety disorder 04/29/2009  . Hemoptysis 04/26/2009  . FATIGUE 09/04/2008  . GRIEF REACTION 06/01/2008  . PHARYNGITIS 01/24/2008  . ARM PAIN, LEFT 01/24/2008  . CERVICAL STRAIN 01/24/2008  . Essential hypertension 11/14/2007  . ELEVATED BP 11/14/2007  . POLYP, COLON 11/04/2007  . OBESITY 11/04/2007  . EROSIVE ESOPHAGITIS 11/04/2007  . GERD 11/04/2007  . PEPTIC STRICTURE 11/04/2007  . UPPER RESPIRATORY INFECTION (URI) 07/12/2007  . LOW BACK PAIN 07/12/2007  . SWEATING 07/12/2007  . Tachycardia 07/12/2007  . COLONIC POLYPS, HX OF 07/12/2007  . B12 deficiency 05/26/2007  . HYPERLIPIDEMIA 05/26/2007  . MITRAL VALVE PROLAPSE 05/26/2007  . Irritable bowel syndrome 05/26/2007  . MENOPAUSAL SYNDROME 05/26/2007  . FIBROMYALGIA 05/26/2007    Sumner Boast., PT 11/07/2015, 9:33 AM  [place] [address] [place], [place], [address] Phone: [phone]   Fax:  [phone]  Name: FRANCELY CHANA MRN: [id number] Date of Birth: [birth year]

## 2015-11-12 ENCOUNTER — Encounter: Payer: Self-pay | Admitting: Physical Therapy

## 2015-11-12 ENCOUNTER — Ambulatory Visit: Payer: Medicare Other | Admitting: Physical Therapy

## 2015-11-12 DIAGNOSIS — R262 Difficulty in walking, not elsewhere classified: Secondary | ICD-10-CM

## 2015-11-12 DIAGNOSIS — M25561 Pain in right knee: Secondary | ICD-10-CM | POA: Diagnosis not present

## 2015-11-12 NOTE — Therapy (Signed)
[place], [place], [address] Phone: [phone]   Fax:  [phone]  Physical Therapy Treatment  Patient Details  Name: Laura Vasquez MRN: [id number] Date of Birth: 02/08/1946 Referring Provider: Creig Hines  Encounter Date: 11/12/2015      PT End of Session - 11/12/15 1050    Visit Number 5   Date for PT Re-Evaluation 12/28/15   PT Start Time [id number]   PT Stop Time 1115   PT Time Calculation (min) 61 min      Past Medical History  Diagnosis Date  . Anxiety   . Hypertension   . Allergy   . GERD (gastroesophageal reflux disease)   . Hyperlipidemia   . Fibromyalgia   . Mitral regurgitation   . Palpitations   . IBS (irritable bowel syndrome)   . Vitamin B12 deficiency   . Hyperplastic colon polyp   . Peptic stricture of esophagus   . Erosive esophagitis   . Osteoarthritis   . Fatty liver   . Arrhythmia   . Diverticulosis   . Hiatal hernia     Past Surgical History  Procedure Laterality Date  . Cholecystectomy    . Tubal ligation    . Appendectomy    . Abdominal hysterectomy    . Rectocele repair      There were no vitals filed for this visit.  Visit Diagnosis:  Difficulty walking      Subjective Assessment - 11/12/15 1015    Subjective getting woorse since last session,not doing HEP as it hurts. entire knee pain Saturday night 8/10. getting frustrated. Pt states pain is not an issue but everyone keeps asking, it is a tightness and instability   Currently in Pain? No/denies   Pain Score 0-No pain   Pain Location Knee   Pain Orientation Right   Pain Descriptors / Indicators Aching   Pain Type Chronic pain   Pain Frequency Constant                         [facility] Adult PT Treatment/Exercise - 11/12/15 0001    Knee/Hip Exercises: Supine   Bridges with Clamshell Strengthening;Both;15 reps  red tband   Straight Leg Raise with External Rotation Limitations 2#  15 times   Other Supine Knee/Hip Exercises add ball squeze with bridge 15 times   Other Supine Knee/Hip Exercises bridge with ball,KTC and obl 15 times   Electrical Stimulation   Electrical Stimulation Location right knee  with ice   Electrical Stimulation Action IFC   Electrical Stimulation Goals Pain   Ultrasound   Ultrasound Location rt knee   Ultrasound Parameters 1 MHz 1.2 w/cm2   Ultrasound Goals Pain   Iontophoresis   Type of Iontophoresis Dexamethasone   Location right medial knee   Dose 50mA   Time 4 hour patch                  PT Short Term Goals - 11/05/15 1105    PT SHORT TERM GOAL #1   Title independent with initial HEP   Status Achieved           PT Long Term Goals - 11/12/15 1052    PT LONG TERM GOAL #1   Title report pain decreased 50%   Status On-going   PT LONG TERM GOAL #2   Title increase ROM of knee flexion to 115 degrees   Status On-going  PT LONG TERM GOAL #3   Title report no instances of the knee giving way   Status On-going               Plan - 11/12/15 1051    Clinical Impression Statement pt frustrated with lack of progress and regression, very minimal lateral tracking at end range ext and improved gait with minimal to no deficiets noted. Explained again to pt about tracking and need for increased HEP compliance to activate VMO.   PT Next Visit Plan If no changes may rec holding and referring back to MD        Problem List Patient Active Problem List   Diagnosis Date Noted  . Degenerative arthritis of right knee 10/04/2015  . Vulvitis 08/14/2015  . Patellar contusion 06/11/2015  . Snoring 11/08/2013  . Elevated hemoglobin ([place]) 10/11/2013  . LLQ abdominal pain 10/10/2013  . Well adult exam 03/25/2012  . Cough 02/03/2012  . Acute sinus infection 01/23/2012  . Vertigo 01/08/2012  . Diverticulosis of large intestine 06/24/2011  . Special screening for malignant neoplasms, colon 06/24/2011  . DIASTOLIC  DYSFUNCTION [id number]  . Palpitations 12/16/2009  . ALLERGIC RHINITIS 11/21/2009  . Generalized anxiety disorder 04/29/2009  . Hemoptysis 04/26/2009  . FATIGUE 09/04/2008  . GRIEF REACTION 06/01/2008  . PHARYNGITIS 01/24/2008  . ARM PAIN, LEFT 01/24/2008  . CERVICAL STRAIN 01/24/2008  . Essential hypertension 11/14/2007  . ELEVATED BP 11/14/2007  . POLYP, COLON 11/04/2007  . OBESITY 11/04/2007  . EROSIVE ESOPHAGITIS 11/04/2007  . GERD 11/04/2007  . PEPTIC STRICTURE 11/04/2007  . UPPER RESPIRATORY INFECTION (URI) 07/12/2007  . LOW BACK PAIN 07/12/2007  . SWEATING 07/12/2007  . Tachycardia 07/12/2007  . COLONIC POLYPS, HX OF 07/12/2007  . B12 deficiency 05/26/2007  . HYPERLIPIDEMIA 05/26/2007  . MITRAL VALVE PROLAPSE 05/26/2007  . Irritable bowel syndrome 05/26/2007  . MENOPAUSAL SYNDROME 05/26/2007  . FIBROMYALGIA 05/26/2007    Laura Vasquez,ANGIE PTA 11/12/2015, 10:54 AM  [address] [place], [place], [address] Phone: [phone]   Fax:  [phone]  Name: Laura Vasquez MRN: [id number] Date of Birth: 09-Nov-1945

## 2015-11-14 ENCOUNTER — Ambulatory Visit: Payer: Medicare Other | Admitting: Physical Therapy

## 2015-11-14 ENCOUNTER — Encounter: Payer: Self-pay | Admitting: Physical Therapy

## 2015-11-14 DIAGNOSIS — M7989 Other specified soft tissue disorders: Secondary | ICD-10-CM

## 2015-11-14 DIAGNOSIS — M25561 Pain in right knee: Secondary | ICD-10-CM | POA: Diagnosis not present

## 2015-11-14 DIAGNOSIS — R262 Difficulty in walking, not elsewhere classified: Secondary | ICD-10-CM

## 2015-11-14 NOTE — Therapy (Signed)
[place], [place], [address] Phone: [phone]   Fax:  [phone]  Physical Therapy Treatment  Patient Details  Name: Laura Vasquez MRN: [id number] Date of Birth: 18-Feb-1946 Referring Provider: Creig Hines  Encounter Date: 11/14/2015      PT End of Session - 11/14/15 1446    Visit Number 6   Date for PT Re-Evaluation 12/28/15   PT Start Time 1353   PT Stop Time 1445   PT Time Calculation (min) 52 min   Activity Tolerance Patient tolerated treatment well   Behavior During Therapy [facility] for tasks assessed/performed      Past Medical History  Diagnosis Date  . Anxiety   . Hypertension   . Allergy   . GERD (gastroesophageal reflux disease)   . Hyperlipidemia   . Fibromyalgia   . Mitral regurgitation   . Palpitations   . IBS (irritable bowel syndrome)   . Vitamin B12 deficiency   . Hyperplastic colon polyp   . Peptic stricture of esophagus   . Erosive esophagitis   . Osteoarthritis   . Fatty liver   . Arrhythmia   . Diverticulosis   . Hiatal hernia     Past Surgical History  Procedure Laterality Date  . Cholecystectomy    . Tubal ligation    . Appendectomy    . Abdominal hysterectomy    . Rectocele repair      There were no vitals filed for this visit.  Visit Diagnosis:  Difficulty walking  Right knee pain  Swelling of limb      Subjective Assessment - 11/14/15 1445    Subjective Patient remains frustrated, continues to talk about instability in the "hinge".  Some worries about soreness in buttocks but this is probably due to working new mms with exercises.   Currently in Pain? No/denies                         [facility] Adult PT Treatment/Exercise - 11/14/15 0001    Ambulation/Gait   Gait Comments gait with patient around the building   Ultrasound   Ultrasound Location right knee   Ultrasound Parameters 1MHz   Ultrasound Goals Pain   Iontophoresis    Type of Iontophoresis Dexamethasone   Location right medial knee   Dose 73mA   Time #3 4 hour patch   Manual Therapy   Manual Therapy Passive ROM;Myofascial release   Myofascial Release used the roller on the quad and the ITB into the buttock   Passive ROM passive HS, piriformis and ITB stretches                  PT Short Term Goals - 11/05/15 1105    PT SHORT TERM GOAL #1   Title independent with initial HEP   Status Achieved           PT Long Term Goals - 11/14/15 1447    PT LONG TERM GOAL #1   Title report pain decreased 50%   Status On-going   PT LONG TERM GOAL #2   Title increase ROM of knee flexion to 115 degrees   Status On-going               Plan - 11/14/15 1446    Clinical Impression Statement Continues to have frustration, she did get a knee sleeve that seems to give good support, was able to walk around the building  without much issue, some c/o instability going down the hill.   PT Next Visit Plan may try to focus on the ROM, STM and do the ionto    Consulted and Agree with Plan of Care Patient        Problem List Patient Active Problem List   Diagnosis Date Noted  . Degenerative arthritis of right knee 10/04/2015  . Vulvitis 08/14/2015  . Patellar contusion 06/11/2015  . Snoring 11/08/2013  . Elevated hemoglobin ([place]) 10/11/2013  . LLQ abdominal pain 10/10/2013  . Well adult exam 03/25/2012  . Cough 02/03/2012  . Acute sinus infection 01/23/2012  . Vertigo 01/08/2012  . Diverticulosis of large intestine 06/24/2011  . Special screening for malignant neoplasms, colon 06/24/2011  . DIASTOLIC DYSFUNCTION 123XX123  . Palpitations 12/16/2009  . ALLERGIC RHINITIS 11/21/2009  . Generalized anxiety disorder 04/29/2009  . Hemoptysis 04/26/2009  . FATIGUE 09/04/2008  . GRIEF REACTION 06/01/2008  . PHARYNGITIS 01/24/2008  . ARM PAIN, LEFT 01/24/2008  . CERVICAL STRAIN 01/24/2008  . Essential hypertension 11/14/2007  . ELEVATED  BP 11/14/2007  . POLYP, COLON 11/04/2007  . OBESITY 11/04/2007  . EROSIVE ESOPHAGITIS 11/04/2007  . GERD 11/04/2007  . PEPTIC STRICTURE 11/04/2007  . UPPER RESPIRATORY INFECTION (URI) 07/12/2007  . LOW BACK PAIN 07/12/2007  . SWEATING 07/12/2007  . Tachycardia 07/12/2007  . COLONIC POLYPS, HX OF 07/12/2007  . B12 deficiency 05/26/2007  . HYPERLIPIDEMIA 05/26/2007  . MITRAL VALVE PROLAPSE 05/26/2007  . Irritable bowel syndrome 05/26/2007  . MENOPAUSAL SYNDROME 05/26/2007  . FIBROMYALGIA 05/26/2007    Sumner Boast., PT 11/14/2015, 2:49 PM  [place] [address] [place], [place], [address] Phone: [phone]   Fax:  [phone]  Name: Laura Vasquez MRN: [id number] Date of Birth: 1946/04/24

## 2015-11-19 ENCOUNTER — Encounter: Payer: Self-pay | Admitting: Physical Therapy

## 2015-11-19 ENCOUNTER — Ambulatory Visit: Payer: Medicare Other | Attending: Family Medicine | Admitting: Physical Therapy

## 2015-11-19 DIAGNOSIS — M25561 Pain in right knee: Secondary | ICD-10-CM | POA: Diagnosis present

## 2015-11-19 DIAGNOSIS — R2241 Localized swelling, mass and lump, right lower limb: Secondary | ICD-10-CM | POA: Diagnosis present

## 2015-11-19 DIAGNOSIS — R600 Localized edema: Secondary | ICD-10-CM | POA: Insufficient documentation

## 2015-11-19 DIAGNOSIS — R262 Difficulty in walking, not elsewhere classified: Secondary | ICD-10-CM

## 2015-11-19 DIAGNOSIS — M7989 Other specified soft tissue disorders: Secondary | ICD-10-CM | POA: Insufficient documentation

## 2015-11-19 NOTE — Therapy (Signed)
[place], [place], [address] Phone: [phone]   Fax:  [phone]  Physical Therapy Treatment  Patient Details  Name: Laura Vasquez MRN: [id number] Date of Birth: 1946-02-17 Referring Provider: Creig Hines  Encounter Date: 11/19/2015      PT End of Session - 11/19/15 0929    Visit Number 7   Date for PT Re-Evaluation 12/28/15   PT Start Time 0844   PT Stop Time 0940   PT Time Calculation (min) 56 min   Activity Tolerance Patient tolerated treatment well   Behavior During Therapy [facility] for tasks assessed/performed      Past Medical History  Diagnosis Date  . Anxiety   . Hypertension   . Allergy   . GERD (gastroesophageal reflux disease)   . Hyperlipidemia   . Fibromyalgia   . Mitral regurgitation   . Palpitations   . IBS (irritable bowel syndrome)   . Vitamin B12 deficiency   . Hyperplastic colon polyp   . Peptic stricture of esophagus   . Erosive esophagitis   . Osteoarthritis   . Fatty liver   . Arrhythmia   . Diverticulosis   . Hiatal hernia     Past Surgical History  Procedure Laterality Date  . Cholecystectomy    . Tubal ligation    . Appendectomy    . Abdominal hysterectomy    . Rectocele repair      There were no vitals filed for this visit.  Visit Diagnosis:  Difficulty walking  Right knee pain  Swelling of limb      Subjective Assessment - 11/19/15 0845    Subjective Patient reports that the patch came loose but she got it to stay on, she dod some yard work over the weekend.  She was wearing sleeve and has a fear that she is going to rely on it.    Currently in Pain? Yes   Pain Score 2    Pain Location Knee   Pain Orientation Right   Pain Descriptors / Indicators Aching                         [facility] Adult PT Treatment/Exercise - 11/19/15 0001    Ambulation/Gait   Gait Comments gait with patient around the building   High Level Balance    High Level Balance Activities Side stepping;Backward walking;Tandem walking   Knee/Hip Exercises: Supine   Quad Sets 2 sets;15 reps   Short Arc Quad Sets 2 sets;15 reps   Short Arc Quad Sets Limitations 2 1/2#   Terminal Knee Extension 20 reps   Other Supine Knee/Hip Exercises add ball squeze with bridge 15 times   Other Supine Knee/Hip Exercises bridge with ball,KTC and obl 15 times   Electrical Stimulation   Electrical Stimulation Location right knee   Electrical Stimulation Action IFC   Electrical Stimulation Goals Pain   Ultrasound   Ultrasound Location right knee   Ultrasound Parameters 1MHz 1.2 w/cm2   Ultrasound Goals Pain                  PT Short Term Goals - 11/05/15 1105    PT SHORT TERM GOAL #1   Title independent with initial HEP   Status Achieved           PT Long Term Goals - 11/14/15 1447    PT LONG TERM GOAL #1   Title report pain decreased  50%   Status On-going   PT LONG TERM GOAL #2   Title increase ROM of knee flexion to 115 degrees   Status On-going               Plan - 11/19/15 1028    Clinical Impression Statement Patient reports that she is unsure if she is getting better, she does exhibit a better walk.     PT Next Visit Plan May see another couple of visits to see if we can have objective and subjective improvements, if not may d/c   Consulted and Agree with Plan of Care Patient        Problem List Patient Active Problem List   Diagnosis Date Noted  . Degenerative arthritis of right knee 10/04/2015  . Vulvitis 08/14/2015  . Patellar contusion 06/11/2015  . Snoring 11/08/2013  . Elevated hemoglobin ([place]) 10/11/2013  . LLQ abdominal pain 10/10/2013  . Well adult exam 03/25/2012  . Cough 02/03/2012  . Acute sinus infection 01/23/2012  . Vertigo 01/08/2012  . Diverticulosis of large intestine 06/24/2011  . Special screening for malignant neoplasms, colon 06/24/2011  . DIASTOLIC DYSFUNCTION 123XX123  .  Palpitations 12/16/2009  . ALLERGIC RHINITIS 11/21/2009  . Generalized anxiety disorder 04/29/2009  . Hemoptysis 04/26/2009  . FATIGUE 09/04/2008  . GRIEF REACTION 06/01/2008  . PHARYNGITIS 01/24/2008  . ARM PAIN, LEFT 01/24/2008  . CERVICAL STRAIN 01/24/2008  . Essential hypertension 11/14/2007  . ELEVATED BP 11/14/2007  . POLYP, COLON 11/04/2007  . OBESITY 11/04/2007  . EROSIVE ESOPHAGITIS 11/04/2007  . GERD 11/04/2007  . PEPTIC STRICTURE 11/04/2007  . UPPER RESPIRATORY INFECTION (URI) 07/12/2007  . LOW BACK PAIN 07/12/2007  . SWEATING 07/12/2007  . Tachycardia 07/12/2007  . COLONIC POLYPS, HX OF 07/12/2007  . B12 deficiency 05/26/2007  . HYPERLIPIDEMIA 05/26/2007  . MITRAL VALVE PROLAPSE 05/26/2007  . Irritable bowel syndrome 05/26/2007  . MENOPAUSAL SYNDROME 05/26/2007  . FIBROMYALGIA 05/26/2007    Sumner Boast., PT 11/19/2015, 10:33 AM  [place] [address] [place], [place], [address] Phone: [phone]   Fax:  [phone]  Name: Laura Vasquez MRN: [id number] Date of Birth: 11/15/1945

## 2015-11-21 ENCOUNTER — Ambulatory Visit: Payer: Medicare Other | Admitting: Physical Therapy

## 2015-11-21 ENCOUNTER — Encounter: Payer: Self-pay | Admitting: Physical Therapy

## 2015-11-21 DIAGNOSIS — R600 Localized edema: Secondary | ICD-10-CM

## 2015-11-21 DIAGNOSIS — R262 Difficulty in walking, not elsewhere classified: Secondary | ICD-10-CM | POA: Insufficient documentation

## 2015-11-21 DIAGNOSIS — M25561 Pain in right knee: Secondary | ICD-10-CM

## 2015-11-21 NOTE — Therapy (Signed)
[place], [place], [address] Phone: [phone]   Fax:  [phone]  Physical Therapy Treatment  Patient Details  Name: Laura Vasquez MRN: [id number] Date of Birth: Nov 10, 1945 Referring Provider: Creig Hines  Encounter Date: 11/21/2015      PT End of Session - 11/21/15 1150    Visit Number 8   Date for PT Re-Evaluation 12/28/15   PT Start Time 1055   PT Stop Time 1150   PT Time Calculation (min) 55 min   Activity Tolerance Patient tolerated treatment well   Behavior During Therapy [facility] for tasks assessed/performed      Past Medical History  Diagnosis Date  . Anxiety   . Hypertension   . Allergy   . GERD (gastroesophageal reflux disease)   . Hyperlipidemia   . Fibromyalgia   . Mitral regurgitation   . Palpitations   . IBS (irritable bowel syndrome)   . Vitamin B12 deficiency   . Hyperplastic colon polyp   . Peptic stricture of esophagus   . Erosive esophagitis   . Osteoarthritis   . Fatty liver   . Arrhythmia   . Diverticulosis   . Hiatal hernia     Past Surgical History  Procedure Laterality Date  . Cholecystectomy    . Tubal ligation    . Appendectomy    . Abdominal hysterectomy    . Rectocele repair      There were no vitals filed for this visit.  Visit Diagnosis:  Difficulty walking  Right knee pain      Subjective Assessment - 11/21/15 1058    Subjective Patient wants to try her knee sleeve to see if it helps with the popping, when she was here earlier this week I could manually push patella medially and decrease the popping with a SAQ exercise   Currently in Pain? Yes   Pain Score 1    Pain Location Knee   Pain Orientation Right                         [facility] Adult PT Treatment/Exercise - 11/21/15 0001    High Level Balance   High Level Balance Activities Side stepping;Backward walking;Tandem walking   High Level Balance Comments resisted  gait fwd and backward   Knee/Hip Exercises: Seated   Long Arc Quad Strengthening;Right;2 sets;10 reps   Knee/Hip Exercises: Supine   Quad Sets 2 sets;15 reps   Short Arc Quad Sets 2 sets;15 reps   Short Arc Quad Sets Limitations 2 1/2 #   Terminal Knee Extension 20 reps   Other Supine Knee/Hip Exercises add ball squeze with bridge 15 times   Iontophoresis   Type of Iontophoresis Dexamethasone   Location right medial knee   Dose 67mA   Time #4 4 hour patch   Manual Therapy   Myofascial Release used the roller on the quad and the ITB into the buttock   Passive ROM passive HS, piriformis and ITB stretches                  PT Short Term Goals - 11/05/15 1105    PT SHORT TERM GOAL #1   Title independent with initial HEP   Status Achieved           PT Long Term Goals - 11/14/15 1447    PT LONG TERM GOAL #1   Title report pain decreased 50%   Status  On-going   PT LONG TERM GOAL #2   Title increase ROM of knee flexion to 115 degrees   Status On-going               Plan - 11/21/15 1150    Clinical Impression Statement The current brace knee sleeve that she has does seem to help with lateral tracking issues, with it on there was less palpable popping, especially with very SAQ and LAQ.  A medium arc quad did have some increased pop.  She is tight in the HS and ITB .   PT Treatment/Interventions ADLs/Self Care Home Management;Electrical Stimulation;Iontophoresis 4mg /ml Dexamethasone;Moist Heat;Cryotherapy;Therapeutic exercise;Manual techniques;Vasopneumatic Device;Therapeutic activities;Functional mobility training;Stair training;Gait training;Ultrasound   PT Next Visit Plan assess next week regarding continueation of PT to help the knee issue   Consulted and Agree with Plan of Care Patient        Problem List Patient Active Problem List   Diagnosis Date Noted  . Degenerative arthritis of right knee 10/04/2015  . Vulvitis 08/14/2015  . Patellar contusion  06/11/2015  . Snoring 11/08/2013  . Elevated hemoglobin ([place]) 10/11/2013  . LLQ abdominal pain 10/10/2013  . Well adult exam 03/25/2012  . Cough 02/03/2012  . Acute sinus infection 01/23/2012  . Vertigo 01/08/2012  . Diverticulosis of large intestine 06/24/2011  . Special screening for malignant neoplasms, colon 06/24/2011  . DIASTOLIC DYSFUNCTION 123XX123  . Palpitations 12/16/2009  . ALLERGIC RHINITIS 11/21/2009  . Generalized anxiety disorder 04/29/2009  . Hemoptysis 04/26/2009  . FATIGUE 09/04/2008  . GRIEF REACTION 06/01/2008  . PHARYNGITIS 01/24/2008  . ARM PAIN, LEFT 01/24/2008  . CERVICAL STRAIN 01/24/2008  . Essential hypertension 11/14/2007  . ELEVATED BP 11/14/2007  . POLYP, COLON 11/04/2007  . OBESITY 11/04/2007  . EROSIVE ESOPHAGITIS 11/04/2007  . GERD 11/04/2007  . PEPTIC STRICTURE 11/04/2007  . UPPER RESPIRATORY INFECTION (URI) 07/12/2007  . LOW BACK PAIN 07/12/2007  . SWEATING 07/12/2007  . Tachycardia 07/12/2007  . COLONIC POLYPS, HX OF 07/12/2007  . B12 deficiency 05/26/2007  . HYPERLIPIDEMIA 05/26/2007  . MITRAL VALVE PROLAPSE 05/26/2007  . Irritable bowel syndrome 05/26/2007  . MENOPAUSAL SYNDROME 05/26/2007  . FIBROMYALGIA 05/26/2007    Sumner Boast., PT 11/21/2015, 11:54 AM  [place] [place] [address] [place], [place], [address] Phone: [phone]   Fax:  [phone]  Name: Laura Vasquez MRN: [id number] Date of Birth: April 11, 1946

## 2015-11-27 NOTE — Addendum Note (Signed)
Addended by: Sumner Boast on: 11/27/2015 11:04 AM   Modules accepted: Orders

## 2015-11-28 ENCOUNTER — Ambulatory Visit: Payer: Medicare Other | Admitting: Physical Therapy

## 2015-11-28 ENCOUNTER — Encounter: Payer: Self-pay | Admitting: Physical Therapy

## 2015-11-28 DIAGNOSIS — R262 Difficulty in walking, not elsewhere classified: Secondary | ICD-10-CM

## 2015-11-28 DIAGNOSIS — M25561 Pain in right knee: Secondary | ICD-10-CM

## 2015-11-28 NOTE — Therapy (Addendum)
[place], [place], [address] Phone: [phone]   Fax:  [phone]  Physical Therapy Treatment  Patient Details  Name: Laura Vasquez MRN: [id number] Date of Birth: Aug 12, 1946 Referring Provider: Creig Hines  Encounter Date: 11/28/2015      PT End of Session - 11/28/15 1054    Visit Number 9   Date for PT Re-Evaluation 12/28/15   PT Start Time 0846   PT Stop Time 0939   PT Time Calculation (min) 53 min   Activity Tolerance Patient tolerated treatment well   Behavior During Therapy [facility] for tasks assessed/performed      Past Medical History  Diagnosis Date  . Anxiety   . Hypertension   . Allergy   . GERD (gastroesophageal reflux disease)   . Hyperlipidemia   . Fibromyalgia   . Mitral regurgitation   . Palpitations   . IBS (irritable bowel syndrome)   . Vitamin B12 deficiency   . Hyperplastic colon polyp   . Peptic stricture of esophagus   . Erosive esophagitis   . Osteoarthritis   . Fatty liver   . Arrhythmia   . Diverticulosis   . Hiatal hernia     Past Surgical History  Procedure Laterality Date  . Cholecystectomy    . Tubal ligation    . Appendectomy    . Abdominal hysterectomy    . Rectocele repair      There were no vitals filed for this visit.      Subjective Assessment - 11/28/15 1049    Subjective Patient with continued worries of the knee instability.  She reports that she feels the sleeve helps.   Currently in Pain? Yes   Pain Score 1    Pain Location Knee   Pain Orientation Right            [facility] PT Assessment - 11/28/15 0001    AROM   Right Knee Extension 0   Right Knee Flexion 115                     [facility] Adult PT Treatment/Exercise - 11/28/15 0001    Self-Care   Self-Care RICE;Other Self-Care Comments   Other Self-Care Comments  went over with patient RICE at home, went over the exercises and stretches that are appropriate for  her to do at home and to back off if hurting.  as well as continued use of the sleeve.  She will try to start using her elliptical at home, I reccommended 2 minutes only   Knee/Hip Exercises: Supine   Quad Sets 2 sets;15 reps   Short Arc Quad Sets 2 sets;15 reps   Short Arc Quad Sets Limitations 3#   Terminal Knee Extension 20 reps   Bridges with Clamshell Strengthening;Both;15 reps   Acupuncturist Location right knee   Chartered certified accountant IFC   Electrical Stimulation Parameters with ice   Electrical Stimulation Goals Pain   Ultrasound   Ultrasound Location right knee   Ultrasound Parameters 1MHz   Ultrasound Goals Pain   Manual Therapy   Passive ROM passive HS, piriformis and ITB stretches                  PT Short Term Goals - 11/05/15 1105    PT SHORT TERM GOAL #1   Title independent with initial HEP   Status Achieved  PT Long Term Goals - 12-20-2015 1124    PT LONG TERM GOAL #1   Title report pain decreased 50%   Status Partially Met   PT LONG TERM GOAL #2   Title increase ROM of knee flexion to 115 degrees   Status Achieved   PT LONG TERM GOAL #3   Title report no instances of the knee giving way   Status Partially Met               Plan - Dec 20, 2015 1055    Clinical Impression Statement Patient continues to have instability and soreness of the right knee, I feel like she has made some progress but is still having issues, we decided that she should continue with her current exercise plan at home avoiding pain.     PT Next Visit Plan will hold PT x 4 weeks as she tries exercise on own.  She is to call if needed, we will d/c   Consulted and Agree with Plan of Care Patient      Patient will benefit from skilled therapeutic intervention in order to improve the following deficits and impairments:     Visit Diagnosis: Difficulty in walking, not elsewhere classified  Pain in right knee        G-Codes - 12-20-15 1043    Functional Assessment Tool Used foto 49% limitation   Functional Limitation Mobility: Walking and moving around   Mobility: Walking and Moving Around Current Status [phone]) At least 40 percent but less than 60 percent impaired, limited or restricted   Mobility: Walking and Moving Around Goal Status [phone]) At least 40 percent but less than 60 percent impaired, limited or restricted      Problem List Patient Active Problem List   Diagnosis Date Noted  . Difficulty walking 11/21/2015  . Degenerative arthritis of right knee 10/04/2015  . Vulvitis 08/14/2015  . Patellar contusion 06/11/2015  . Snoring 11/08/2013  . Elevated hemoglobin ([place]) 10/11/2013  . LLQ abdominal pain 10/10/2013  . Well adult exam 03/25/2012  . Cough 02/03/2012  . Acute sinus infection 01/23/2012  . Vertigo 01/08/2012  . Diverticulosis of large intestine 06/24/2011  . Special screening for malignant neoplasms, colon 06/24/2011  . DIASTOLIC DYSFUNCTION 09/81/1914  . Palpitations 12/16/2009  . ALLERGIC RHINITIS 11/21/2009  . Generalized anxiety disorder 04/29/2009  . Hemoptysis 04/26/2009  . FATIGUE 09/04/2008  . GRIEF REACTION 06/01/2008  . PHARYNGITIS 01/24/2008  . ARM PAIN, LEFT 01/24/2008  . CERVICAL STRAIN 01/24/2008  . Essential hypertension 11/14/2007  . ELEVATED BP 11/14/2007  . POLYP, COLON 11/04/2007  . OBESITY 11/04/2007  . EROSIVE ESOPHAGITIS 11/04/2007  . GERD 11/04/2007  . PEPTIC STRICTURE 11/04/2007  . UPPER RESPIRATORY INFECTION (URI) 07/12/2007  . LOW BACK PAIN 07/12/2007  . SWEATING 07/12/2007  . Tachycardia 07/12/2007  . COLONIC POLYPS, HX OF 07/12/2007  . B12 deficiency 05/26/2007  . HYPERLIPIDEMIA 05/26/2007  . MITRAL VALVE PROLAPSE 05/26/2007  . Irritable bowel syndrome 05/26/2007  . MENOPAUSAL SYNDROME 05/26/2007  . FIBROMYALGIA 05/26/2007    PHYSICAL THERAPY DISCHARGE SUMMARY   Plan: Patient agrees to discharge.  Patient goals were not met.  Patient is being discharged due to not returning since the last visit.  ?????        Sumner Boast., PT Dec 20, 2015, 11:26 AM  [address] [address] [place], [place], [address] Phone: [phone]   Fax:  [phone]  Name: Laura Vasquez MRN: [id number] Date of  Birth: 15-Jan-1946

## 2015-12-11 ENCOUNTER — Other Ambulatory Visit: Payer: Self-pay

## 2015-12-11 DIAGNOSIS — Z1231 Encounter for screening mammogram for malignant neoplasm of breast: Secondary | ICD-10-CM

## 2015-12-31 ENCOUNTER — Ambulatory Visit
Admission: RE | Admit: 2015-12-31 | Discharge: 2015-12-31 | Disposition: A | Discharge status: Home / Self Care | Payer: Medicare Other | Source: Ambulatory Visit

## 2015-12-31 DIAGNOSIS — Z1231 Encounter for screening mammogram for malignant neoplasm of breast: Secondary | ICD-10-CM

## 2016-02-03 ENCOUNTER — Ambulatory Visit (INDEPENDENT_AMBULATORY_CARE_PROVIDER_SITE_OTHER): Payer: Medicare Other | Admitting: Family Medicine

## 2016-02-03 ENCOUNTER — Encounter: Payer: Self-pay | Admitting: Family Medicine

## 2016-02-03 VITALS — BP 136/90 | HR 99 | Ht 63.25 in | Wt 178.0 lb

## 2016-02-03 DIAGNOSIS — M25561 Pain in right knee: Secondary | ICD-10-CM

## 2016-02-03 DIAGNOSIS — M1711 Unilateral primary osteoarthritis, right knee: Secondary | ICD-10-CM

## 2016-02-03 NOTE — Progress Notes (Signed)
Pre visit review using our clinic review tool, if applicable. No additional management support is needed unless otherwise documented below in the visit note. 

## 2016-02-03 NOTE — Patient Instructions (Signed)
Good to see you  Ice is your friend I would continue compression sleeve We will get MRI and call you with the results and discuss next steps and see if we need to get Alusio involved.  Keep trucking along!

## 2016-02-03 NOTE — Assessment & Plan Note (Addendum)
Patient does have degenerative changes of the right knee seen on x-ray. Patient has failed all other conservative therapy at this time. Patient feels that she continues to have instability. On exam today patient does have some increasing in crepitus could be contributing as well as some increasing pain over the medial joint line. The ligaments appear to be intact at the moment. Patient still feels that this is associated with patient's fall at a grocery store. She has finished with 8 weeks of formal physical therapy. At this time I do feel advance imaging is warranted. MRI will be ordered. Depending on findings we will discuss. Even if patient does have a meniscal tear due to the amount of arthritis I do not know if he'll be able to be repaired versus possible need for joint replacement. Patient still states that she is adamant that she would like to avoid surgical intervention but I feel that at this time with no improvement this might be quite difficult. Patient will continue with any over-the-counter supplements or pain relievers. Declined brace.  Spent  45 minutes with patient face-to-face and had greater than 50% of counseling including as described above in assessment and plan.

## 2016-02-03 NOTE — Progress Notes (Signed)
[facility] [address] [place], [place] [address] Phone: [phone] Subjective:     CC: Knee pain follow up  [id number] Laura Vasquez is a 70 y.o. female coming in with complaint of knee pain after fall. This is her right knee. Patient fell October 15 at a grocery store.   patient has had difficulty making progress with this knee. There is a likelihood for patient exacerbating some underlying arthritis from The fall. Patient was last seen 4 months ago. Is in formal physical therapy. Was given an injection at that time. Patient's x-ray show moderate osteophytic changes as well as CPPD. Patient was trying over-the-counter natural supplementations. Patient states She is attempting to get more information from another provider difficulty finding any true answers. Last time we saw patient she was fairly disgruntled. Patient states that she has not made any significant improvement. Was given an injection at last follow-up. Patient states that she did not have any improvement. Patient continues to have pain at all times. Describes it as a dull, throbbing aching pain. Has been going to formal physical therapy fairly religiously and states that she has enjoyed it but does not feel that it has helped tremendously. Patient feels that there is something considerably wrong and is having instability of the knee. Feels that she would not be able to continue to live this way with this knee because it is affecting her daily activities.  Past Medical History  Diagnosis Date  . Anxiety   . Hypertension   . Allergy   . GERD (gastroesophageal reflux disease)   . Hyperlipidemia   . Fibromyalgia   . Mitral regurgitation   . Palpitations   . IBS (irritable bowel syndrome)   . Vitamin B12 deficiency   . Hyperplastic colon polyp   . Peptic stricture of esophagus   . Erosive esophagitis   . Osteoarthritis   . Fatty liver   . Arrhythmia   . Diverticulosis   . Hiatal hernia      Past Surgical History  Procedure Laterality Date  . Cholecystectomy    . Tubal ligation    . Appendectomy    . Abdominal hysterectomy    . Rectocele repair     Social History  Substance Use Topics  . Smoking status: Former Smoker    Quit date: 08/18/1991  . Smokeless tobacco: Never Used  . Alcohol Use: 0.0 oz/week    0 Standard drinks or equivalent per week     Comment: 1-2 glasses    Allergies  Allergen Reactions  . Amoxicillin-Pot Clavulanate   . Ceftin [Cefuroxime Axetil]     Lips burning  . Codeine   . Naproxen   . Sulfonamide Derivatives    Family History  Problem Relation Age of Onset  . Hypertension Mother   . Ovarian cancer Mother   . Hypertension Father   . Fibromyalgia Sister     x 2  . Colon cancer Neg Hx   . Diabetes Sister         Past medical history, social, surgical and family history all reviewed in electronic medical record.   Review of Systems: No headache, visual changes, nausea, vomiting, diarrhea, constipation, dizziness, abdominal pain, skin rash, fevers, chills, night sweats, weight loss, swollen lymph nodes, body aches, joint swelling, muscle aches, chest pain, shortness of breath, mood changes.   Objective Blood pressure 136/90, pulse 99, height 5' 3.25" (1.607 m), weight 178 lb (80.74 kg), SpO2 94 %.  General:  No apparent distress alert and oriented x3 mood and affect normal, dressed appropriately.  HEENT: Pupils equal, extraocular movements intact  Respiratory: Patient's speak in full sentences and does not appear short of breath  Cardiovascular: No lower extremity edema, non tender, no erythema  Skin: Warm dry intact with no signs of infection or rash on extremities or on axial skeleton.  Abdomen: Soft nontender  Neuro: Cranial nerves II through XII are intact, neurovascularly intact in all extremities with 2+ DTRs and 2+ pulses.  Lymph: No lymphadenopathy of posterior or anterior cervical chain or axillae bilaterally.  Gait mild  antalgic which is new.  MSK:  Non tender with full range of motion and good stability and symmetric strength and tone of shoulders, elbows, wrist, hip, and ankles bilaterally.  Knee: Right No effusion noted.  Tenderness seems to be more over the medial joint line as well as somewhat over the patellofemoral joint.  ROM full in flexion and extension and lower leg rotation. Ligaments with solid consistent endpoints including ACL, PCL, LCL, MCL. Negative Mcmurray's, Apley's, and Thessalonian tests. Moderate painful patellar compression. Patellar glide with mild crepitus. Patellar and quadriceps tendons unremarkable. Hamstring and quadriceps strength is normal.  Contralateral knee unremarkable      Impression and Recommendations:     This case required medical decision making of moderate complexity.

## 2016-02-12 ENCOUNTER — Ambulatory Visit (INDEPENDENT_AMBULATORY_CARE_PROVIDER_SITE_OTHER): Payer: Medicare Other | Admitting: Internal Medicine

## 2016-02-12 ENCOUNTER — Other Ambulatory Visit (INDEPENDENT_AMBULATORY_CARE_PROVIDER_SITE_OTHER): Payer: Medicare Other

## 2016-02-12 ENCOUNTER — Encounter: Payer: Self-pay | Admitting: Internal Medicine

## 2016-02-12 VITALS — BP 138/80 | HR 84 | Wt 180.0 lb

## 2016-02-12 DIAGNOSIS — R0683 Snoring: Secondary | ICD-10-CM

## 2016-02-12 DIAGNOSIS — M1711 Unilateral primary osteoarthritis, right knee: Secondary | ICD-10-CM

## 2016-02-12 DIAGNOSIS — E538 Deficiency of other specified B group vitamins: Secondary | ICD-10-CM

## 2016-02-12 DIAGNOSIS — S8001XD Contusion of right knee, subsequent encounter: Secondary | ICD-10-CM

## 2016-02-12 DIAGNOSIS — R000 Tachycardia, unspecified: Secondary | ICD-10-CM

## 2016-02-12 DIAGNOSIS — I10 Essential (primary) hypertension: Secondary | ICD-10-CM

## 2016-02-12 DIAGNOSIS — F411 Generalized anxiety disorder: Secondary | ICD-10-CM

## 2016-02-12 DIAGNOSIS — K580 Irritable bowel syndrome with diarrhea: Secondary | ICD-10-CM

## 2016-02-12 DIAGNOSIS — D582 Other hemoglobinopathies: Secondary | ICD-10-CM

## 2016-02-12 DIAGNOSIS — S139XXS Sprain of joints and ligaments of unspecified parts of neck, sequela: Secondary | ICD-10-CM

## 2016-02-12 DIAGNOSIS — Z0000 Encounter for general adult medical examination without abnormal findings: Secondary | ICD-10-CM

## 2016-02-12 DIAGNOSIS — E785 Hyperlipidemia, unspecified: Secondary | ICD-10-CM

## 2016-02-12 LAB — URINALYSIS
Bilirubin Urine: NEGATIVE
HGB URINE DIPSTICK: NEGATIVE
Ketones, ur: NEGATIVE
Leukocytes, UA: NEGATIVE
Nitrite: NEGATIVE
PH: 7.5 (ref 5.0–8.0)
SPECIFIC GRAVITY, URINE: 1.01 (ref 1.000–1.030)
TOTAL PROTEIN, URINE-UPE24: NEGATIVE
URINE GLUCOSE: NEGATIVE
Urobilinogen, UA: 0.2 (ref 0.0–1.0)

## 2016-02-12 NOTE — Assessment & Plan Note (Signed)
F/u w/Dr Smith 

## 2016-02-12 NOTE — Assessment & Plan Note (Signed)
F/u w/Dr Pyrtle 

## 2016-02-12 NOTE — Progress Notes (Signed)
Pre visit review using our clinic review tool, if applicable. No additional management support is needed unless otherwise documented below in the visit note. 

## 2016-02-12 NOTE — Assessment & Plan Note (Signed)
Pt denies OSA. She has an elevated Hgb Declined sleep test

## 2016-02-12 NOTE — Assessment & Plan Note (Signed)
BP is nl at home. On Amlodipine

## 2016-02-12 NOTE — Progress Notes (Signed)
Subjective:  Patient ID: Laura Vasquez, female    DOB: 1945/11/10  Age: 70 y.o. MRN: [id number]  CC: No chief complaint on file.   HPI [facility] Vasquez [facility] presents for elevated BP, anxiety f/u. 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 Pt fell at [facility] in Oct 2016 - hurt her R knee - seeing Dr Tamala Julian; MRI R knee is pending  Outpatient Prescriptions Prior to Visit  Medication Sig Dispense Refill  . aspirin EC 81 MG tablet Take 81 mg by mouth daily.    . B Complex Vitamins (B COMPLEX PO) Take 1 tablet by mouth daily.    . calcium carbonate (TUMS - DOSED IN MG ELEMENTAL CALCIUM) 500 MG chewable tablet Chew 1 tablet by mouth as needed.     . cholecalciferol (VITAMIN D) 1000 UNITS tablet Take 2,000 Units by mouth daily.     . cyanocobalamin 500 MCG tablet Take 500 mcg by mouth daily.    . diazepam (VALIUM) 5 MG tablet Take 0.5-1 tablets (2.5-5 mg total) by mouth every 12 (twelve) hours as needed for anxiety. 60 tablet 5  . MAGNESIUM PO Take 800 mg by mouth.     . meclizine (ANTIVERT) 25 MG tablet Take 25 mg by mouth as needed.     . nystatin ointment (MYCOSTATIN) Reported on 08/14/2015 AS NEEDED  0  . Omega-3 Fatty Acids (FISH OIL PO) Take 1-2 each by mouth daily.     Marland Kitchen omeprazole (PRILOSEC) 20 MG capsule Take 1 capsule (20 mg total) by mouth daily. 30 capsule 11  . propranolol (INDERAL) 10 MG tablet Take 0.5 tablets by mouth daily.     No facility-administered medications prior to visit.    ROS Review of Systems  Constitutional: Negative for chills, activity change, appetite change, fatigue and unexpected weight change.  HENT: Negative for congestion, mouth sores and sinus pressure.   Eyes: Negative for visual disturbance.  Respiratory: Negative for cough and chest tightness.   Cardiovascular: Positive for palpitations. Negative for chest pain and leg swelling.  Gastrointestinal: Negative for nausea and abdominal pain.  Genitourinary: Negative for frequency, difficulty urinating and vaginal pain.    Musculoskeletal: Negative for back pain and gait problem.  Skin: Negative for pallor and rash.  Neurological: Negative for dizziness, tremors, weakness, numbness and headaches.  Psychiatric/Behavioral: Negative for confusion and sleep disturbance. The patient is nervous/anxious.   snoring, dry mouth  Objective:  BP 160/100 mmHg  Pulse 84  Wt 180 lb (81.647 kg)  SpO2 95%  BP Readings from Last 3 Encounters:  02/12/16 160/100  02/03/16 136/90  10/04/15 146/84    Wt Readings from Last 3 Encounters:  02/12/16 180 lb (81.647 kg)  02/03/16 178 lb (80.74 kg)  10/04/15 180 lb (81.647 kg)    Physical Exam  Constitutional: She appears well-developed. No distress.  HENT:  Head: Normocephalic.  Right Ear: External ear normal.  Left Ear: External ear normal.  Nose: Nose normal.  Mouth/Throat: Oropharynx is clear and moist. No oropharyngeal exudate.  Eyes: Conjunctivae are normal. Pupils are equal, round, and reactive to light. Right eye exhibits no discharge. Left eye exhibits no discharge.  Neck: Normal range of motion. Neck supple. No JVD present. No tracheal deviation present. No thyromegaly present.  Cardiovascular: Normal rate, regular rhythm and normal heart sounds.   Pulmonary/Chest: No stridor. No respiratory distress. She has no wheezes.  Abdominal: Soft. Bowel sounds are normal. She exhibits no distension and no mass. There is no tenderness. There is  no rebound and no guarding.  Musculoskeletal: She exhibits no edema or tenderness.  Lymphadenopathy:    She has no cervical adenopathy.  Neurological: She displays normal reflexes. No cranial nerve deficit. She exhibits normal muscle tone. Coordination normal.  Skin: No rash noted. No erythema.  Psychiatric: She has a normal mood and affect. Her behavior is normal. Judgment and thought content normal.  R knee is in a copper brace  Lab Results  Component Value Date   WBC 5.0 04/05/2015   HGB 15.2* 04/05/2015   HCT 45.3  04/05/2015   PLT 227.0 04/05/2015   GLUCOSE 96 04/05/2015   CHOL 261* 04/05/2015   TRIG 68.0 04/05/2015   HDL 92.30 04/05/2015   LDLDIRECT 172.9 04/03/2013   LDLCALC 155* 04/05/2015   ALT 27 04/05/2015   AST 28 04/05/2015   NA 140 04/05/2015   K 4.6 04/05/2015   CL 104 04/05/2015   CREATININE 0.84 04/05/2015   BUN 13 04/05/2015   CO2 28 04/05/2015   TSH 2.68 04/05/2015    Mm Digital Screening Bilateral  01/01/2016  CLINICAL DATA:  Screening. EXAM: DIGITAL SCREENING BILATERAL MAMMOGRAM WITH CAD COMPARISON:  Previous exam(s). ACR Breast Density Category b: There are scattered areas of fibroglandular density. FINDINGS: There are no findings suspicious for malignancy. Images were processed with CAD. IMPRESSION: No mammographic evidence of malignancy. A result letter of this screening mammogram will be mailed directly to the patient. RECOMMENDATION: Screening mammogram in one year. (Code:SM-B-01Y) BI-RADS CATEGORY  1: Negative. Electronically Signed   By: Altamese Cabal M.D.   On: 01/01/2016 07:56    Assessment & Plan:   There are no diagnoses linked to this encounter. I am having Laura Vasquez maintain her Omega-3 Fatty Acids (FISH OIL PO), cholecalciferol, MAGNESIUM PO, calcium carbonate, aspirin EC, meclizine, cyanocobalamin, diazepam, B Complex Vitamins (B COMPLEX PO), omeprazole, nystatin ointment, propranolol, and terbinafine.  Meds ordered this encounter  Medications  . terbinafine (LAMISIL) 250 MG tablet    Sig: as needed.    Refill:  98     Follow-up: No Follow-up on file.  Walker Kehr, MD

## 2016-02-12 NOTE — Assessment & Plan Note (Signed)
Pt fell at [facility] in Oct 2016 - hurt her R knee - seeing Dr Tamala Julian; MRI R knee is pending

## 2016-02-12 NOTE — Assessment & Plan Note (Signed)
On Vit B12 

## 2016-02-12 NOTE — Assessment & Plan Note (Signed)
On Propranolol 

## 2016-02-12 NOTE — Assessment & Plan Note (Signed)
Diazepam prn - rare  Potential benefits of a long term benzodiazepines  use as well as potential risks  and complications were explained to the patient and were aknowledged.

## 2016-02-14 ENCOUNTER — Ambulatory Visit
Admission: RE | Admit: 2016-02-14 | Discharge: 2016-02-14 | Disposition: A | Discharge status: Home / Self Care | Payer: Medicare Other | Source: Ambulatory Visit | Attending: Family Medicine | Admitting: Family Medicine

## 2016-02-14 DIAGNOSIS — M25561 Pain in right knee: Secondary | ICD-10-CM

## 2016-02-19 ENCOUNTER — Telehealth: Payer: Self-pay

## 2016-02-19 NOTE — Telephone Encounter (Signed)
Patient called about her mri on her knee. She is very upset she does not have the results yet. Could you please follow up with her.

## 2016-02-19 NOTE — Telephone Encounter (Signed)
Attempted to call 3 times today  Left message to call back  Finally got hold of her.  Discussed with patient for elongated amount of time. Discussed with her that this is only arthritis. Very small loose body that likely not contributing to the pain. Patient does have some mild to moderate changes of the medial and lateral compartment as well as. Discussed with patient that I feel that surgical intervention would be more beneficial for her. Patient is wanting to avoid that. Going to discuss different treatment options with her family. We discussed with her that I do not feel she will get good relief with viscous supplementation. Patient will come back and see me if necessary. We'll be getting medical records from medical records and we'll consider otherwise.

## 2016-05-13 ENCOUNTER — Other Ambulatory Visit (INDEPENDENT_AMBULATORY_CARE_PROVIDER_SITE_OTHER): Payer: Medicare Other

## 2016-05-13 DIAGNOSIS — E538 Deficiency of other specified B group vitamins: Secondary | ICD-10-CM | POA: Diagnosis not present

## 2016-05-13 DIAGNOSIS — Z0000 Encounter for general adult medical examination without abnormal findings: Secondary | ICD-10-CM | POA: Diagnosis not present

## 2016-05-13 DIAGNOSIS — I10 Essential (primary) hypertension: Secondary | ICD-10-CM | POA: Diagnosis not present

## 2016-05-13 DIAGNOSIS — R000 Tachycardia, unspecified: Secondary | ICD-10-CM

## 2016-05-13 DIAGNOSIS — E785 Hyperlipidemia, unspecified: Secondary | ICD-10-CM

## 2016-05-13 LAB — BASIC METABOLIC PANEL
BUN: 13 mg/dL (ref 6–23)
CHLORIDE: 103 meq/L (ref 96–112)
CO2: 29 mEq/L (ref 19–32)
Calcium: 9.6 mg/dL (ref 8.4–10.5)
Creatinine, Ser: 0.85 mg/dL (ref 0.40–1.20)
GFR: 69.82 mL/min (ref >60.00)
GLUCOSE: 93 mg/dL (ref 70–99)
POTASSIUM: 4.8 meq/L (ref 3.5–5.1)
Sodium: 141 mEq/L (ref 135–145)

## 2016-05-13 LAB — HEPATIC FUNCTION PANEL
ALBUMIN: 4.4 g/dL (ref 3.5–5.2)
ALT: 24 U/L (ref 0–35)
AST: 25 U/L (ref 0–37)
Alkaline Phosphatase: 40 U/L (ref 39–117)
BILIRUBIN TOTAL: 0.9 mg/dL (ref 0.2–1.2)
Bilirubin, Direct: 0.1 mg/dL (ref 0.0–0.3)
Total Protein: 7.5 g/dL (ref 6.0–8.3)

## 2016-05-13 LAB — CBC WITH DIFFERENTIAL/PLATELET
BASOS PCT: 0.3 % (ref 0.0–3.0)
Basophils Absolute: 0 10*3/uL (ref 0.0–0.1)
EOS PCT: 1.8 % (ref 0.0–5.0)
Eosinophils Absolute: 0.1 10*3/uL (ref 0.0–0.7)
HCT: 43.2 % (ref 36.0–46.0)
HEMOGLOBIN: 14.8 g/dL (ref 12.0–15.0)
LYMPHS ABS: 1.6 10*3/uL (ref 0.7–4.0)
Lymphocytes Relative: 30.9 % (ref 12.0–46.0)
MCHC: 34.2 g/dL (ref 30.0–36.0)
MCV: 89.3 fl (ref 78.0–100.0)
MONO ABS: 0.5 10*3/uL (ref 0.1–1.0)
Monocytes Relative: 8.7 % (ref 3.0–12.0)
NEUTROS PCT: 58.3 % (ref 43.0–77.0)
Neutro Abs: 3.1 10*3/uL (ref 1.4–7.7)
Platelets: 232 10*3/uL (ref 150.0–400.0)
RBC: 4.84 Mil/uL (ref 3.87–5.11)
RDW: 13.5 % (ref 11.5–15.5)
WBC: 5.3 10*3/uL (ref 4.0–10.5)

## 2016-05-13 LAB — HEPATITIS C ANTIBODY: HCV AB: NEGATIVE

## 2016-05-13 LAB — LIPID PANEL
Cholesterol: 250 mg/dL — ABNORMAL HIGH (ref 0–200)
HDL: 92.9 mg/dL (ref >39.00)
LDL Cholesterol: 138 mg/dL — ABNORMAL HIGH (ref 0–99)
NonHDL: 156.93
Total CHOL/HDL Ratio: 3
Triglycerides: 95 mg/dL (ref 0.0–149.0)
VLDL: 19 mg/dL (ref 0.0–40.0)

## 2016-05-13 LAB — VITAMIN B12: VITAMIN B 12: 1244 pg/mL — AB (ref 211–911)

## 2016-05-13 LAB — MICROALBUMIN / CREATININE URINE RATIO
CREATININE, U: 24.5 mg/dL
MICROALB/CREAT RATIO: 2.9 mg/g (ref 0.0–30.0)

## 2016-05-13 LAB — TSH: TSH: 3.38 u[IU]/mL (ref 0.35–4.50)

## 2016-05-18 ENCOUNTER — Encounter: Payer: Self-pay | Admitting: Internal Medicine

## 2016-05-18 ENCOUNTER — Ambulatory Visit (INDEPENDENT_AMBULATORY_CARE_PROVIDER_SITE_OTHER): Payer: Medicare Other | Admitting: Internal Medicine

## 2016-05-18 DIAGNOSIS — Z23 Encounter for immunization: Secondary | ICD-10-CM | POA: Diagnosis not present

## 2016-05-18 DIAGNOSIS — R030 Elevated blood-pressure reading, without diagnosis of hypertension: Secondary | ICD-10-CM

## 2016-05-18 DIAGNOSIS — I10 Essential (primary) hypertension: Secondary | ICD-10-CM

## 2016-05-18 DIAGNOSIS — E538 Deficiency of other specified B group vitamins: Secondary | ICD-10-CM | POA: Diagnosis not present

## 2016-05-18 DIAGNOSIS — Z0000 Encounter for general adult medical examination without abnormal findings: Secondary | ICD-10-CM

## 2016-05-18 DIAGNOSIS — R002 Palpitations: Secondary | ICD-10-CM | POA: Diagnosis not present

## 2016-05-18 NOTE — Assessment & Plan Note (Signed)
Good BP at home

## 2016-05-18 NOTE — Patient Instructions (Signed)
Preventive Care for Adults, Female A healthy lifestyle and preventive care can promote health and wellness. Preventive health guidelines for women include the following key practices.  A routine yearly physical is a good way to check with your health care provider about your health and preventive screening. It is a chance to share any concerns and updates on your health and to receive a thorough exam.  Visit your dentist for a routine exam and preventive care every 6 months. Brush your teeth twice a day and floss once a day. Good oral hygiene prevents tooth decay and gum disease.  The frequency of eye exams is based on your age, health, family medical history, use of contact lenses, and other factors. Follow your health care provider's recommendations for frequency of eye exams.  Eat a healthy diet. Foods like vegetables, fruits, whole grains, low-fat dairy products, and lean protein foods contain the nutrients you need without too many calories. Decrease your intake of foods high in solid fats, added sugars, and salt. Eat the right amount of calories for you.Get information about a proper diet from your health care provider, if necessary.  Regular physical exercise is one of the most important things you can do for your health. Most adults should get at least 150 minutes of moderate-intensity exercise (any activity that increases your heart rate and causes you to sweat) each week. In addition, most adults need muscle-strengthening exercises on 2 or more days a week.  Maintain a healthy weight. The body mass index (BMI) is a screening tool to identify possible weight problems. It provides an estimate of body fat based on height and weight. Your health care provider can find your BMI and can help you achieve or maintain a healthy weight.For adults 20 years and older:  A BMI below 18.5 is considered underweight.  A BMI of 18.5 to 24.9 is normal.  A BMI of 25 to 29.9 is considered overweight.  A  BMI of 30 and above is considered obese.  Maintain normal blood lipids and cholesterol levels by exercising and minimizing your intake of saturated fat. Eat a balanced diet with plenty of fruit and vegetables. Blood tests for lipids and cholesterol should begin at age 85 and be repeated every 5 years. If your lipid or cholesterol levels are high, you are over 50, or you are at high risk for heart disease, you may need your cholesterol levels checked more frequently.Ongoing high lipid and cholesterol levels should be treated with medicines if diet and exercise are not working.  If you smoke, find out from your health care provider how to quit. If you do not use tobacco, do not start.  Lung cancer screening is recommended for adults aged 8-80 years who are at high risk for developing lung cancer because of a history of smoking. A yearly low-dose CT scan of the lungs is recommended for people who have at least a 30-pack-year history of smoking and are a current smoker or have quit within the past 15 years. A pack year of smoking is smoking an average of 1 pack of cigarettes a day for 1 year (for example: 1 pack a day for 30 years or 2 packs a day for 15 years). Yearly screening should continue until the smoker has stopped smoking for at least 15 years. Yearly screening should be stopped for people who develop a health problem that would prevent them from having lung cancer treatment.  If you are pregnant, do not drink alcohol. If you are  breastfeeding, be very cautious about drinking alcohol. If you are not pregnant and choose to drink alcohol, do not have more than 1 drink per day. One drink is considered to be 12 ounces (355 mL) of beer, 5 ounces (148 mL) of wine, or 1.5 ounces (44 mL) of liquor.  Avoid use of street drugs. Do not share needles with anyone. Ask for help if you need support or instructions about stopping the use of drugs.  High blood pressure causes heart disease and increases the risk  of stroke. Your blood pressure should be checked at least every 1 to 2 years. Ongoing high blood pressure should be treated with medicines if weight loss and exercise do not work.  If you are 55-79 years old, ask your health care provider if you should take aspirin to prevent strokes.  Diabetes screening is done by taking a blood sample to check your blood glucose level after you have not eaten for a certain period of time (fasting). If you are not overweight and you do not have risk factors for diabetes, you should be screened once every 3 years starting at age 45. If you are overweight or obese and you are 40-70 years of age, you should be screened for diabetes every year as part of your cardiovascular risk assessment.  Breast cancer screening is essential preventive care for women. You should practice "breast self-awareness." This means understanding the normal appearance and feel of your breasts and may include breast self-examination. Any changes detected, no matter how small, should be reported to a health care provider. Women in their 20s and 30s should have a clinical breast exam (CBE) by a health care provider as part of a regular health exam every 1 to 3 years. After age 40, women should have a CBE every year. Starting at age 40, women should consider having a mammogram (breast X-ray test) every year. Women who have a family history of breast cancer should talk to their health care provider about genetic screening. Women at a high risk of breast cancer should talk to their health care providers about having an MRI and a mammogram every year.  Breast cancer gene (BRCA)-related cancer risk assessment is recommended for women who have family members with BRCA-related cancers. BRCA-related cancers include breast, ovarian, tubal, and peritoneal cancers. Having family members with these cancers may be associated with an increased risk for harmful changes (mutations) in the breast cancer genes BRCA1 and  BRCA2. Results of the assessment will determine the need for genetic counseling and BRCA1 and BRCA2 testing.  Your health care provider may recommend that you be screened regularly for cancer of the pelvic organs (ovaries, uterus, and vagina). This screening involves a pelvic examination, including checking for microscopic changes to the surface of your cervix (Pap test). You may be encouraged to have this screening done every 3 years, beginning at age 21.  For women ages 30-65, health care providers may recommend pelvic exams and Pap testing every 3 years, or they may recommend the Pap and pelvic exam, combined with testing for human papilloma virus (HPV), every 5 years. Some types of HPV increase your risk of cervical cancer. Testing for HPV may also be done on women of any age with unclear Pap test results.  Other health care providers may not recommend any screening for nonpregnant women who are considered low risk for pelvic cancer and who do not have symptoms. Ask your health care provider if a screening pelvic exam is right for   you.  If you have had past treatment for cervical cancer or a condition that could lead to cancer, you need Pap tests and screening for cancer for at least 20 years after your treatment. If Pap tests have been discontinued, your risk factors (such as having a new sexual partner) need to be reassessed to determine if screening should resume. Some women have medical problems that increase the chance of getting cervical cancer. In these cases, your health care provider may recommend more frequent screening and Pap tests.  Colorectal cancer can be detected and often prevented. Most routine colorectal cancer screening begins at the age of 50 years and continues through age 75 years. However, your health care provider may recommend screening at an earlier age if you have risk factors for colon cancer. On a yearly basis, your health care provider may provide home test kits to check  for hidden blood in the stool. Use of a small camera at the end of a tube, to directly examine the colon (sigmoidoscopy or colonoscopy), can detect the earliest forms of colorectal cancer. Talk to your health care provider about this at age 50, when routine screening begins. Direct exam of the colon should be repeated every 5-10 years through age 75 years, unless early forms of precancerous polyps or small growths are found.  People who are at an increased risk for hepatitis B should be screened for this virus. You are considered at high risk for hepatitis B if:  You were born in a country where hepatitis B occurs often. Talk with your health care provider about which countries are considered high risk.  Your parents were born in a high-risk country and you have not received a shot to protect against hepatitis B (hepatitis B vaccine).  You have HIV or AIDS.  You use needles to inject street drugs.  You live with, or have sex with, someone who has hepatitis B.  You get hemodialysis treatment.  You take certain medicines for conditions like cancer, organ transplantation, and autoimmune conditions.  Hepatitis C blood testing is recommended for all people born from 1945 through 1965 and any individual with known risks for hepatitis C.  Practice safe sex. Use condoms and avoid high-risk sexual practices to reduce the spread of sexually transmitted infections (STIs). STIs include gonorrhea, chlamydia, syphilis, trichomonas, herpes, HPV, and human immunodeficiency virus (HIV). Herpes, HIV, and HPV are viral illnesses that have no cure. They can result in disability, cancer, and death.  You should be screened for sexually transmitted illnesses (STIs) including gonorrhea and chlamydia if:  You are sexually active and are younger than 24 years.  You are older than 24 years and your health care provider tells you that you are at risk for this type of infection.  Your sexual activity has changed  since you were last screened and you are at an increased risk for chlamydia or gonorrhea. Ask your health care provider if you are at risk.  If you are at risk of being infected with HIV, it is recommended that you take a prescription medicine daily to prevent HIV infection. This is called preexposure prophylaxis (PrEP). You are considered at risk if:  You are sexually active and do not regularly use condoms or know the HIV status of your partner(s).  You take drugs by injection.  You are sexually active with a partner who has HIV.  Talk with your health care provider about whether you are at high risk of being infected with HIV. If   you choose to begin PrEP, you should first be tested for HIV. You should then be tested every 3 months for as long as you are taking PrEP.  Osteoporosis is a disease in which the bones lose minerals and strength with aging. This can result in serious bone fractures or breaks. The risk of osteoporosis can be identified using a bone density scan. Women ages 67 years and over and women at risk for fractures or osteoporosis should discuss screening with their health care providers. Ask your health care provider whether you should take a calcium supplement or vitamin D to reduce the rate of osteoporosis.  Menopause can be associated with physical symptoms and risks. Hormone replacement therapy is available to decrease symptoms and risks. You should talk to your health care provider about whether hormone replacement therapy is right for you.  Use sunscreen. Apply sunscreen liberally and repeatedly throughout the day. You should seek shade when your shadow is shorter than you. Protect yourself by wearing long sleeves, pants, a wide-brimmed hat, and sunglasses year round, whenever you are outdoors.  Once a month, do a whole body skin exam, using a mirror to look at the skin on your back. Tell your health care provider of new moles, moles that have irregular borders, moles that  are larger than a pencil eraser, or moles that have changed in shape or color.  Stay current with required vaccines (immunizations).  Influenza vaccine. All adults should be immunized every year.  Tetanus, diphtheria, and acellular pertussis (Td, Tdap) vaccine. Pregnant women should receive 1 dose of Tdap vaccine during each pregnancy. The dose should be obtained regardless of the length of time since the last dose. Immunization is preferred during the 27th-36th week of gestation. An adult who has not previously received Tdap or who does not know her vaccine status should receive 1 dose of Tdap. This initial dose should be followed by tetanus and diphtheria toxoids (Td) booster doses every 10 years. Adults with an unknown or incomplete history of completing a 3-dose immunization series with Td-containing vaccines should begin or complete a primary immunization series including a Tdap dose. Adults should receive a Td booster every 10 years.  Varicella vaccine. An adult without evidence of immunity to varicella should receive 2 doses or a second dose if she has previously received 1 dose. Pregnant females who do not have evidence of immunity should receive the first dose after pregnancy. This first dose should be obtained before leaving the health care facility. The second dose should be obtained 4-8 weeks after the first dose.  Human papillomavirus (HPV) vaccine. Females aged 13-26 years who have not received the vaccine previously should obtain the 3-dose series. The vaccine is not recommended for use in pregnant females. However, pregnancy testing is not needed before receiving a dose. If a female is found to be pregnant after receiving a dose, no treatment is needed. In that case, the remaining doses should be delayed until after the pregnancy. Immunization is recommended for any person with an immunocompromised condition through the age of 61 years if she did not get any or all doses earlier. During the  3-dose series, the second dose should be obtained 4-8 weeks after the first dose. The third dose should be obtained 24 weeks after the first dose and 16 weeks after the second dose.  Zoster vaccine. One dose is recommended for adults aged 30 years or older unless certain conditions are present.  Measles, mumps, and rubella (MMR) vaccine. Adults born  before 1957 generally are considered immune to measles and mumps. Adults born in [birth year] or later should have 1 or more doses of MMR vaccine unless there is a contraindication to the vaccine or there is laboratory evidence of immunity to each of the three diseases. A routine second dose of MMR vaccine should be obtained at least 28 days after the first dose for students attending postsecondary schools, health care workers, or international travelers. People who received inactivated measles vaccine or an unknown type of measles vaccine during 1963-1967 should receive 2 doses of MMR vaccine. People who received inactivated mumps vaccine or an unknown type of mumps vaccine before 1979 and are at high risk for mumps infection should consider immunization with 2 doses of MMR vaccine. For females of childbearing age, rubella immunity should be determined. If there is no evidence of immunity, females who are not pregnant should be vaccinated. If there is no evidence of immunity, females who are pregnant should delay immunization until after pregnancy. Unvaccinated health care workers born before [birth year] who lack laboratory evidence of measles, mumps, or rubella immunity or laboratory confirmation of disease should consider measles and mumps immunization with 2 doses of MMR vaccine or rubella immunization with 1 dose of MMR vaccine.  Pneumococcal 13-valent conjugate (PCV13) vaccine. When indicated, a person who is uncertain of his immunization history and has no record of immunization should receive the PCV13 vaccine. All adults 89 years of age and older should receive this  vaccine. An adult aged 15 years or older who has certain medical conditions and has not been previously immunized should receive 1 dose of PCV13 vaccine. This PCV13 should be followed with a dose of pneumococcal polysaccharide (PPSV23) vaccine. Adults who are at high risk for pneumococcal disease should obtain the PPSV23 vaccine at least 8 weeks after the dose of PCV13 vaccine. Adults older than 72 years of age who have normal immune system function should obtain the PPSV23 vaccine dose at least 1 year after the dose of PCV13 vaccine.  Pneumococcal polysaccharide (PPSV23) vaccine. When PCV13 is also indicated, PCV13 should be obtained first. All adults aged 12 years and older should be immunized. An adult younger than age 41 years who has certain medical conditions should be immunized. Any person who resides in a nursing home or long-term care facility should be immunized. An adult smoker should be immunized. People with an immunocompromised condition and certain other conditions should receive both PCV13 and PPSV23 vaccines. People with human immunodeficiency virus (HIV) infection should be immunized as soon as possible after diagnosis. Immunization during chemotherapy or radiation therapy should be avoided. Routine use of PPSV23 vaccine is not recommended for American Indians, [place] Natives, or people younger than 65 years unless there are medical conditions that require PPSV23 vaccine. When indicated, people who have unknown immunization and have no record of immunization should receive PPSV23 vaccine. One-time revaccination 5 years after the first dose of PPSV23 is recommended for people aged 19-64 years who have chronic kidney failure, nephrotic syndrome, asplenia, or immunocompromised conditions. People who received 1-2 doses of PPSV23 before age 42 years should receive another dose of PPSV23 vaccine at age 61 years or later if at least 5 years have passed since the previous dose. Doses of PPSV23 are not  needed for people immunized with PPSV23 at or after age 58 years.  Meningococcal vaccine. Adults with asplenia or persistent complement component deficiencies should receive 2 doses of quadrivalent meningococcal conjugate (MenACWY-D) vaccine. The doses should be obtained  at least 2 months apart. Microbiologists working with certain meningococcal bacteria, [place] recruits, people at risk during an outbreak, and people who travel to or live in countries with a high rate of meningitis should be immunized. A first-year college student up through age 34 years who is living in a residence hall should receive a dose if she did not receive a dose on or after her 16th birthday. Adults who have certain high-risk conditions should receive one or more doses of vaccine.  Hepatitis A vaccine. Adults who wish to be protected from this disease, have certain high-risk conditions, work with hepatitis A-infected animals, work in hepatitis A research labs, or travel to or work in countries with a high rate of hepatitis A should be immunized. Adults who were previously unvaccinated and who anticipate close contact with an international adoptee during the first 60 days after arrival in the [place] from a country with a high rate of hepatitis A should be immunized.  Hepatitis B vaccine. Adults who wish to be protected from this disease, have certain high-risk conditions, may be exposed to blood or other infectious body fluids, are household contacts or sex partners of hepatitis B positive people, are clients or workers in certain care facilities, or travel to or work in countries with a high rate of hepatitis B should be immunized.  Haemophilus influenzae type b (Hib) vaccine. A previously unvaccinated person with asplenia or sickle cell disease or having a scheduled splenectomy should receive 1 dose of Hib vaccine. Regardless of previous immunization, a recipient of a hematopoietic stem cell transplant should receive a  3-dose series 6-12 months after her successful transplant. Hib vaccine is not recommended for adults with HIV infection. Preventive Services / Frequency Ages 35 to 4 years  Blood pressure check.** / Every 3-5 years.  Lipid and cholesterol check.** / Every 5 years beginning at age 60.  Clinical breast exam.** / Every 3 years for women in their 71s and 10s.  BRCA-related cancer risk assessment.** / For women who have family members with a BRCA-related cancer (breast, ovarian, tubal, or peritoneal cancers).  Pap test.** / Every 2 years from ages 76 through 26. Every 3 years starting at age 61 through age 76 or 93 with a history of 3 consecutive normal Pap tests.  HPV screening.** / Every 3 years from ages 37 through ages 60 to 51 with a history of 3 consecutive normal Pap tests.  Hepatitis C blood test.** / For any individual with known risks for hepatitis C.  Skin self-exam. / Monthly.  Influenza vaccine. / Every year.  Tetanus, diphtheria, and acellular pertussis (Tdap, Td) vaccine.** / Consult your health care provider. Pregnant women should receive 1 dose of Tdap vaccine during each pregnancy. 1 dose of Td every 10 years.  Varicella vaccine.** / Consult your health care provider. Pregnant females who do not have evidence of immunity should receive the first dose after pregnancy.  HPV vaccine. / 3 doses over 6 months, if 93 and younger. The vaccine is not recommended for use in pregnant females. However, pregnancy testing is not needed before receiving a dose.  Measles, mumps, rubella (MMR) vaccine.** / You need at least 1 dose of MMR if you were born in 1957 or later. You may also need a 2nd dose. For females of childbearing age, rubella immunity should be determined. If there is no evidence of immunity, females who are not pregnant should be vaccinated. If there is no evidence of immunity, females who are  pregnant should delay immunization until after pregnancy.  Pneumococcal  13-valent conjugate (PCV13) vaccine.** / Consult your health care provider.  Pneumococcal polysaccharide (PPSV23) vaccine.** / 1 to 2 doses if you smoke cigarettes or if you have certain conditions.  Meningococcal vaccine.** / 1 dose if you are age 68 to 8 years and a Market researcher living in a residence hall, or have one of several medical conditions, you need to get vaccinated against meningococcal disease. You may also need additional booster doses.  Hepatitis A vaccine.** / Consult your health care provider.  Hepatitis B vaccine.** / Consult your health care provider.  Haemophilus influenzae type b (Hib) vaccine.** / Consult your health care provider. Ages 7 to 53 years  Blood pressure check.** / Every year.  Lipid and cholesterol check.** / Every 5 years beginning at age 25 years.  Lung cancer screening. / Every year if you are aged 11-80 years and have a 30-pack-year history of smoking and currently smoke or have quit within the past 15 years. Yearly screening is stopped once you have quit smoking for at least 15 years or develop a health problem that would prevent you from having lung cancer treatment.  Clinical breast exam.** / Every year after age 48 years.  BRCA-related cancer risk assessment.** / For women who have family members with a BRCA-related cancer (breast, ovarian, tubal, or peritoneal cancers).  Mammogram.** / Every year beginning at age 41 years and continuing for as long as you are in good health. Consult with your health care provider.  Pap test.** / Every 3 years starting at age 65 years through age 37 or 70 years with a history of 3 consecutive normal Pap tests.  HPV screening.** / Every 3 years from ages 72 years through ages 60 to 40 years with a history of 3 consecutive normal Pap tests.  Fecal occult blood test (FOBT) of stool. / Every year beginning at age 21 years and continuing until age 5 years. You may not need to do this test if you get  a colonoscopy every 10 years.  Flexible sigmoidoscopy or colonoscopy.** / Every 5 years for a flexible sigmoidoscopy or every 10 years for a colonoscopy beginning at age 35 years and continuing until age 48 years.  Hepatitis C blood test.** / For all people born from [birth year] through 1967 and any individual with known risks for hepatitis C.  Skin self-exam. / Monthly.  Influenza vaccine. / Every year.  Tetanus, diphtheria, and acellular pertussis (Tdap/Td) vaccine.** / Consult your health care provider. Pregnant women should receive 1 dose of Tdap vaccine during each pregnancy. 1 dose of Td every 10 years.  Varicella vaccine.** / Consult your health care provider. Pregnant females who do not have evidence of immunity should receive the first dose after pregnancy.  Zoster vaccine.** / 1 dose for adults aged 30 years or older.  Measles, mumps, rubella (MMR) vaccine.** / You need at least 1 dose of MMR if you were born in 1957 or later. You may also need a second dose. For females of childbearing age, rubella immunity should be determined. If there is no evidence of immunity, females who are not pregnant should be vaccinated. If there is no evidence of immunity, females who are pregnant should delay immunization until after pregnancy.  Pneumococcal 13-valent conjugate (PCV13) vaccine.** / Consult your health care provider.  Pneumococcal polysaccharide (PPSV23) vaccine.** / 1 to 2 doses if you smoke cigarettes or if you have certain conditions.  Meningococcal vaccine.** /  Consult your health care provider.  Hepatitis A vaccine.** / Consult your health care provider.  Hepatitis B vaccine.** / Consult your health care provider.  Haemophilus influenzae type b (Hib) vaccine.** / Consult your health care provider. Ages 64 years and over  Blood pressure check.** / Every year.  Lipid and cholesterol check.** / Every 5 years beginning at age 23 years.  Lung cancer screening. / Every year if you  are aged 16-80 years and have a 30-pack-year history of smoking and currently smoke or have quit within the past 15 years. Yearly screening is stopped once you have quit smoking for at least 15 years or develop a health problem that would prevent you from having lung cancer treatment.  Clinical breast exam.** / Every year after age 74 years.  BRCA-related cancer risk assessment.** / For women who have family members with a BRCA-related cancer (breast, ovarian, tubal, or peritoneal cancers).  Mammogram.** / Every year beginning at age 44 years and continuing for as long as you are in good health. Consult with your health care provider.  Pap test.** / Every 3 years starting at age 58 years through age 22 or 39 years with 3 consecutive normal Pap tests. Testing can be stopped between 65 and 70 years with 3 consecutive normal Pap tests and no abnormal Pap or HPV tests in the past 10 years.  HPV screening.** / Every 3 years from ages 64 years through ages 70 or 61 years with a history of 3 consecutive normal Pap tests. Testing can be stopped between 65 and 70 years with 3 consecutive normal Pap tests and no abnormal Pap or HPV tests in the past 10 years.  Fecal occult blood test (FOBT) of stool. / Every year beginning at age 40 years and continuing until age 27 years. You may not need to do this test if you get a colonoscopy every 10 years.  Flexible sigmoidoscopy or colonoscopy.** / Every 5 years for a flexible sigmoidoscopy or every 10 years for a colonoscopy beginning at age 7 years and continuing until age 32 years.  Hepatitis C blood test.** / For all people born from [birth year] through 1967 and any individual with known risks for hepatitis C.  Osteoporosis screening.** / A one-time screening for women ages 30 years and over and women at risk for fractures or osteoporosis.  Skin self-exam. / Monthly.  Influenza vaccine. / Every year.  Tetanus, diphtheria, and acellular pertussis (Tdap/Td)  vaccine.** / 1 dose of Td every 10 years.  Varicella vaccine.** / Consult your health care provider.  Zoster vaccine.** / 1 dose for adults aged 35 years or older.  Pneumococcal 13-valent conjugate (PCV13) vaccine.** / Consult your health care provider.  Pneumococcal polysaccharide (PPSV23) vaccine.** / 1 dose for all adults aged 46 years and older.  Meningococcal vaccine.** / Consult your health care provider.  Hepatitis A vaccine.** / Consult your health care provider.  Hepatitis B vaccine.** / Consult your health care provider.  Haemophilus influenzae type b (Hib) vaccine.** / Consult your health care provider. ** Family history and personal history of risk and conditions may change your health care provider's recommendations.   This information is not intended to replace advice given to you by your health care provider. Make sure you discuss any questions you have with your health care provider.   Document Released: 09/29/2001 Document Revised: 08/24/2014 Document Reviewed: 12/29/2010 Elsevier Interactive Patient Education [employer].

## 2016-05-18 NOTE — Assessment & Plan Note (Signed)
Here for medicare wellness/physical  Diet: heart healthy  Physical activity: not sedentary  Depression/mood screen: negative  Hearing: intact to whispered voice  Visual acuity: grossly normal, performs annual eye exam  ADLs: capable  Fall risk: low to none  Home safety: good  Cognitive evaluation: intact to orientation, naming, recall and repetition  EOL planning: adv directives, full code/ I agree  I have personally reviewed and have noted  1. The patient's medical, surgical and social history  2. Their use of alcohol, tobacco or illicit drugs  3. Their current medications and supplements  4. The patient's functional ability including ADL's, fall risks, home safety risks and hearing or visual impairment.  5. Diet and physical activities  6. Evidence for depression or mood disorders 7. The roster of all physicians providing medical care to patient - is listed in the Snapshot section of the chart and reviewed today.    Today patient counseled on age appropriate routine health concerns for screening and prevention, each reviewed and up to date or declined. Immunizations reviewed and up to date or declined. Labs ordered and reviewed. Risk factors for depression reviewed and negative. Hearing function and visual acuity are intact. ADLs screened and addressed as needed. Functional ability and level of safety reviewed and appropriate. Education, counseling and referrals performed based on assessed risks today. Patient provided with a copy of personalized plan for preventive services.       Colon due 2022  

## 2016-05-18 NOTE — Progress Notes (Signed)
Subjective:  Patient ID: Laura Vasquez, female    DOB: 09/17/1945  Age: 69 y.o. MRN: [id number]  CC: No chief complaint on file.   HPI Laura Vasquez [facility] presents for a well exam. C/o R knee x 1 year - seeing Dr Tamala Julian, had an MRI - OA. BP is ok at home.  Outpatient Medications Prior to Visit  Medication Sig Dispense Refill  . aspirin EC 81 MG tablet Take 81 mg by mouth daily.    . B Complex Vitamins (B COMPLEX PO) Take 1 tablet by mouth daily.    . calcium carbonate (TUMS - DOSED IN MG ELEMENTAL CALCIUM) 500 MG chewable tablet Chew 1 tablet by mouth as needed.     . cholecalciferol (VITAMIN D) 1000 UNITS tablet Take 2,000 Units by mouth daily.     . cyanocobalamin 500 MCG tablet Take 500 mcg by mouth daily.    . diazepam (VALIUM) 5 MG tablet Take 0.5-1 tablets (2.5-5 mg total) by mouth every 12 (twelve) hours as needed for anxiety. 60 tablet 5  . MAGNESIUM PO Take 800 mg by mouth.     . meclizine (ANTIVERT) 25 MG tablet Take 25 mg by mouth as needed.     . nystatin ointment (MYCOSTATIN) Reported on 08/14/2015 AS NEEDED  0  . Omega-3 Fatty Acids (FISH OIL PO) Take 1-2 each by mouth daily.     Marland Kitchen omeprazole (PRILOSEC) 20 MG capsule Take 1 capsule (20 mg total) by mouth daily. 30 capsule 11  . propranolol (INDERAL) 10 MG tablet Take 0.5 tablets by mouth daily.    Marland Kitchen terbinafine (LAMISIL) 250 MG tablet as needed.  98   No facility-administered medications prior to visit.     ROS Review of Systems  Constitutional: Negative for activity change, appetite change, chills, fatigue and unexpected weight change.  HENT: Negative for congestion, mouth sores and sinus pressure.   Eyes: Negative for visual disturbance.  Respiratory: Negative for cough and chest tightness.   Gastrointestinal: Negative for abdominal pain and nausea.  Genitourinary: Negative for difficulty urinating, frequency and vaginal pain.  Musculoskeletal: Positive for arthralgias and gait problem. Negative for back pain.  Skin:  Negative for pallor and rash.  Neurological: Negative for dizziness, tremors, weakness, numbness and headaches.  Psychiatric/Behavioral: Negative for confusion and sleep disturbance.    Objective:  Pulse 86   Temp 98.9 F (37.2 C) (Oral)   Ht 5\' 4"  (1.626 m)   Wt 183 lb (83 kg)   SpO2 95%   BMI 31.41 kg/m   BP Readings from Last 3 Encounters:  02/12/16 138/80  02/03/16 136/90  10/04/15 (!) 146/84    Wt Readings from Last 3 Encounters:  05/18/16 183 lb (83 kg)  02/12/16 180 lb (81.6 kg)  02/03/16 178 lb (80.7 kg)    Physical Exam  Constitutional: She appears well-developed. No distress.  HENT:  Head: Normocephalic.  Right Ear: External ear normal.  Left Ear: External ear normal.  Nose: Nose normal.  Mouth/Throat: Oropharynx is clear and moist.  Eyes: Conjunctivae are normal. Pupils are equal, round, and reactive to light. Right eye exhibits no discharge. Left eye exhibits no discharge.  Neck: Normal range of motion. Neck supple. No JVD present. No tracheal deviation present. No thyromegaly present.  Cardiovascular: Normal rate, regular rhythm and normal heart sounds.   Pulmonary/Chest: No stridor. No respiratory distress. She has no wheezes.  Abdominal: Soft. Bowel sounds are normal. She exhibits no distension and no mass. There is no  tenderness. There is no rebound and no guarding.  Musculoskeletal: She exhibits tenderness. She exhibits no edema.  Lymphadenopathy:    She has no cervical adenopathy.  Neurological: She displays normal reflexes. No cranial nerve deficit. She exhibits normal muscle tone. Coordination normal.  Skin: No rash noted. No erythema.  Psychiatric: She has a normal mood and affect. Her behavior is normal. Judgment and thought content normal.  R knee is tender B groins WNL  Lab Results  Component Value Date   WBC 5.3 05/13/2016   HGB 14.8 05/13/2016   HCT 43.2 05/13/2016   PLT 232.0 05/13/2016   GLUCOSE 93 05/13/2016   CHOL 250 (H)  05/13/2016   TRIG 95.0 05/13/2016   HDL 92.90 05/13/2016   LDLDIRECT 172.9 04/03/2013   LDLCALC 138 (H) 05/13/2016   ALT 24 05/13/2016   AST 25 05/13/2016   NA 141 05/13/2016   K 4.8 05/13/2016   CL 103 05/13/2016   CREATININE 0.85 05/13/2016   BUN 13 05/13/2016   CO2 29 05/13/2016   TSH 3.38 05/13/2016   MICROALBUR <0.7 05/13/2016    Mr Knee Right Wo Contrast  Result Date: 02/14/2016 CLINICAL DATA:  Right knee pain.  Status post fall October 2016. EXAM: MRI OF THE RIGHT KNEE WITHOUT CONTRAST TECHNIQUE: Multiplanar, multisequence MR imaging of the knee was performed. No intravenous contrast was administered. COMPARISON:  None. FINDINGS: MENISCI Medial meniscus:  Intact. Lateral meniscus:  Intact. LIGAMENTS Cruciates:  Intact ACL and PCL. Collaterals: Medial collateral ligament is intact. Lateral collateral ligament complex is intact. CARTILAGE Patellofemoral: Full-thickness cartilage loss of the lateral patellar facet and lateral trochlea with subchondral reactive marrow changes of the lateral patellar facet. High-grade partial-thickness cartilage loss with areas of full-thickness cartilage loss of the medial patellofemoral compartment. Medial: Mild partial-thickness cartilage loss of the medial femoral condyle and medial tibial plateau. Lateral: Partial-thickness cartilage loss of the lateral femoral condyle. Chondromalacia of the lateral tibial plateau. Joint: No joint effusion. Edema in superolateral Hoffa's fat. No plical thickening. Small loose body posterior to the medial joint space measuring 4.8 mm. Popliteal Fossa:  Small Baker cyst.  Intact popliteus tendon. Extensor Mechanism:  Intact. Bones: No other focal marrow signal abnormality. No fracture or dislocation. Other: None IMPRESSION: 1. Tricompartmental cartilage abnormalities as described above, most severe in the patellofemoral compartment. 2. Edema in superolateral Hoffa's fat as can be seen with patellar tendon -lateral femoral  condyle friction syndrome. Electronically Signed   By: Kathreen Devoid   On: 02/14/2016 11:24    Assessment & Plan:   There are no diagnoses linked to this encounter. I am having Ms. Hannibal maintain her Omega-3 Fatty Acids (FISH OIL PO), cholecalciferol, MAGNESIUM PO, calcium carbonate, aspirin EC, meclizine, cyanocobalamin, diazepam, B Complex Vitamins (B COMPLEX PO), omeprazole, nystatin ointment, propranolol, and terbinafine.  No orders of the defined types were placed in this encounter.    Follow-up: No Follow-up on file.  Walker Kehr, MD

## 2016-05-18 NOTE — Assessment & Plan Note (Signed)
On B12 

## 2016-05-18 NOTE — Assessment & Plan Note (Signed)
Doing well 

## 2016-05-18 NOTE — Assessment & Plan Note (Addendum)
Pt declined additional Rx On Propranolol BP is ok at home

## 2016-05-18 NOTE — Progress Notes (Signed)
Pre visit review using our clinic review tool, if applicable. No additional management support is needed unless otherwise documented below in the visit note. 

## 2016-05-18 NOTE — Addendum Note (Signed)
Addended by: Cresenciano Lick on: 05/18/2016 11:26 AM   Modules accepted: Orders

## 2016-05-26 ENCOUNTER — Telehealth: Payer: Self-pay | Admitting: *Deleted

## 2016-05-26 NOTE — Telephone Encounter (Signed)
I mailed copy of 05/18/16 OV note with PCP to patient's home address per her request. See request/release in media.

## 2016-08-19 ENCOUNTER — Other Ambulatory Visit: Payer: Self-pay | Admitting: Internal Medicine

## 2016-09-25 ENCOUNTER — Encounter (INDEPENDENT_AMBULATORY_CARE_PROVIDER_SITE_OTHER): Payer: Self-pay

## 2016-09-25 ENCOUNTER — Encounter: Payer: Self-pay | Admitting: Internal Medicine

## 2016-09-25 ENCOUNTER — Ambulatory Visit (INDEPENDENT_AMBULATORY_CARE_PROVIDER_SITE_OTHER): Payer: Medicare Other | Admitting: Internal Medicine

## 2016-09-25 VITALS — Ht 63.25 in | Wt 188.0 lb

## 2016-09-25 DIAGNOSIS — K589 Irritable bowel syndrome without diarrhea: Secondary | ICD-10-CM

## 2016-09-25 DIAGNOSIS — K648 Other hemorrhoids: Secondary | ICD-10-CM

## 2016-09-25 DIAGNOSIS — K602 Anal fissure, unspecified: Secondary | ICD-10-CM

## 2016-09-25 DIAGNOSIS — K219 Gastro-esophageal reflux disease without esophagitis: Secondary | ICD-10-CM

## 2016-09-25 NOTE — Progress Notes (Signed)
Subjective:    Patient ID: Laura Vasquez, female    DOB: 06-14-1946, 71 y.o.   MRN: [id number]  HPI Laura Vasquez is a 71 year old female with a history of IBS, diverticulosis, hyperplastic distal colon polyps, GERD, myalgia, hyperlipidemia, hypertension, anxiety who seen in follow-up. She was seen 1 year ago. She reports recently she has been doing fairly well. She has been dealing with nervousness and anxiety regarding her husband's complicated healthcare issues.  Her biggest complaint recently is she's had some perianal soreness and feels that she may have had a fissure. She saw a single tiny "spot of blood" one time on the toilet tissue several weeks ago. She has been using Metamucil on a daily basis and with this having "excellent" bowel movements once per day. She has the feeling of complete evacuation which she is very satisfied with. She has had some intestinal gas associated with Metamucil. Metamucil has worked at her for her than Benefiber. She reports without Metamucil she has some fecal smearing which is scant. She's using Cagle exercises which helps with her bowel movements and also bladder control. She tries witch hazel for some perianal irritation. She's also noticed some burning and at times itching in her intergluteal cleft. She's previous he had this issue under her breasts. She's tried ketoconazole cream in the past with success.  She was given a prescription for Robinul Forte for left lower quadrant abdominal pain last year but did not need this. She has very intermittent left-sided abdominal discomfort which comes and goes. This has been mild recently. She relates this to her chronic irritable bowel. She is using omeprazole 20 mg daily without upper GI complaint.   Review of Systems As per history of present illness, otherwise negative  Current Medications, Allergies, Past Medical History, Past Surgical History, Family History and Social History were reviewed in [facility] record.     Objective:   Physical Exam Ht 5' 3.25" (1.607 m)   Wt 188 lb (85.3 kg)   BMI 33.04 kg/m  Constitutional: Well-developed and well-nourished. No distress. HEENT: Normocephalic and atraumatic.  Conjunctivae are normal.  No scleral icterus. Neck: Neck supple. Trachea midline. Cardiovascular: Normal rate, regular rhythm and intact distal pulses.  Pulmonary/chest: Effort normal and breath sounds normal. No wheezing, rales or rhonchi. Abdominal: Soft, obese, nontender, nondistended. Bowel sounds active throughout.  Rectal: Healing, very small posterior lateral fissure, very mild and small prolapsed internal hemorrhoids easily reducible, no masses or tenderness. There is some erythema and mild scaling in the posterior gluteal cleft Extremities: no clubbing, cyanosis, or edema Neurological: Alert and oriented to person place and time. Skin: Skin is warm and dry. See above Psychiatric: Normal mood and affect. Behavior is normal.     Assessment & Plan:  71 year old female with a history of IBS, diverticulosis, hyperplastic distal colon polyps, GERD, myalgia, hyperlipidemia, hypertension, anxiety who seen in follow-up  1. Healing anal fissure/small internal hemorrhoids/mild tinea -- she can continue witch hazel for mild hemorrhoidal symptoms. I recommended zinc oxide ointment/Desitin for the likely mild fungal rash and intergluteal cleft. If not improving can use her previously prescribed ketoconazole twice daily 5 days. Fissure is healing and needs no further treatment. Continue Metamucil to help keep bowels regular.  2. IBS with intermittent left lower quadrant pain -- stable and doing well from this standpoint recently. Continue Metamucil daily. Can try Robinul Forte 2 mg every 12 hours as needed if she has a flare of irritable bowel with left  lower quadrant crampy pain.  3. GERD -- stable without alarm symptom. Continue omeprazole 20 mg daily  4. CRC screening  -- history of small, distal hyperplastic polyps only. Last full colonoscopy was 2012, repeat recommended 2022  25 minutes spent with the patient today. Greater than 50% was spent in counseling and coordination of care with the patient

## 2016-09-25 NOTE — Patient Instructions (Addendum)
Please continue your metamucil.  Continue your glycopyrrolate (Robinul) as needed for lower abdominal pain.  Remain on your omeprazole (Prilosec).  Use Desitin (over the counter) as needed for the perianal rash. If rash does not improve after several days, you may use some of the ketoconazole cream you have on hand for no more than 5 days.  Please follow up with Dr Hilarie Fredrickson in 6-12 months.  [place] [facility] for the avacodos!!!  If you are age 73 or older, your body mass index should be between 23-30. Your Body mass index is 33.04 kg/m. If this is out of the aforementioned range listed, please consider follow up with your Primary Care Provider.  If you are age 57 or younger, your body mass index should be between 19-25. Your Body mass index is 33.04 kg/m. If this is out of the aformentioned range listed, please consider follow up with your Primary Care Provider.

## 2016-11-18 ENCOUNTER — Ambulatory Visit (INDEPENDENT_AMBULATORY_CARE_PROVIDER_SITE_OTHER): Payer: Medicare Other | Admitting: Internal Medicine

## 2016-11-18 ENCOUNTER — Encounter: Payer: Self-pay | Admitting: Internal Medicine

## 2016-11-18 DIAGNOSIS — F411 Generalized anxiety disorder: Secondary | ICD-10-CM | POA: Diagnosis not present

## 2016-11-18 DIAGNOSIS — Z6833 Body mass index (BMI) 33.0-33.9, adult: Secondary | ICD-10-CM

## 2016-11-18 DIAGNOSIS — I10 Essential (primary) hypertension: Secondary | ICD-10-CM

## 2016-11-18 DIAGNOSIS — E6609 Other obesity due to excess calories: Secondary | ICD-10-CM

## 2016-11-18 DIAGNOSIS — E538 Deficiency of other specified B group vitamins: Secondary | ICD-10-CM

## 2016-11-18 DIAGNOSIS — R000 Tachycardia, unspecified: Secondary | ICD-10-CM | POA: Diagnosis not present

## 2016-11-18 NOTE — Patient Instructions (Signed)
?   Chin strap

## 2016-11-18 NOTE — Assessment & Plan Note (Signed)
Wt Readings from Last 3 Encounters:  11/18/16 188 lb (85.3 kg)  09/25/16 188 lb (85.3 kg)  05/18/16 183 lb (83 kg)

## 2016-11-18 NOTE — Assessment & Plan Note (Signed)
Diazepam prn 

## 2016-11-18 NOTE — Assessment & Plan Note (Signed)
On Propranolol 

## 2016-11-18 NOTE — Assessment & Plan Note (Signed)
Vit D 

## 2016-11-18 NOTE — Progress Notes (Signed)
Subjective:  Patient ID: Laura Vasquez, female    DOB: [birth year]  Age: 71 y.o. MRN: [id number]  CC: Follow-up   HPI Laura Vasquez [facility] presents for B12 def, OA - R knee pain, anxiety and elevated BP f/u. BP nl at home   Outpatient Medications Prior to Visit  Medication Sig Dispense Refill  . aspirin EC 81 MG tablet Take 81 mg by mouth daily.    . B Complex Vitamins (B COMPLEX PO) Take 1 tablet by mouth daily.    . calcium carbonate (TUMS - DOSED IN MG ELEMENTAL CALCIUM) 500 MG chewable tablet Chew 1 tablet by mouth as needed.     . cholecalciferol (VITAMIN D) 1000 UNITS tablet Take 2,000 Units by mouth daily.     . cyanocobalamin 500 MCG tablet Take 500 mcg by mouth daily.    . diazepam (VALIUM) 5 MG tablet Take 0.5-1 tablets (2.5-5 mg total) by mouth every 12 (twelve) hours as needed for anxiety. 60 tablet 5  . MAGNESIUM PO Take 800 mg by mouth.     . meclizine (ANTIVERT) 25 MG tablet Take 25 mg by mouth as needed.     . Omega-3 Fatty Acids (FISH OIL PO) Take 1-2 each by mouth daily.     Marland Kitchen omeprazole (PRILOSEC) 20 MG capsule take 1 capsule by mouth once daily 30 capsule 5  . propranolol (INDERAL) 10 MG tablet Take 0.5 tablets by mouth daily.    . psyllium (METAMUCIL) 58.6 % packet Take 1 packet by mouth as needed.    . terbinafine (LAMISIL) 250 MG tablet as needed.  98   No facility-administered medications prior to visit.     ROS Review of Systems  Constitutional: Positive for fatigue. Negative for activity change, appetite change, chills and unexpected weight change.  HENT: Negative for congestion, mouth sores and sinus pressure.   Eyes: Negative for visual disturbance.  Respiratory: Negative for cough and chest tightness.   Gastrointestinal: Negative for abdominal pain and nausea.  Genitourinary: Negative for difficulty urinating, frequency and vaginal pain.  Musculoskeletal: Positive for arthralgias. Negative for back pain and gait problem.  Skin: Negative for pallor and  rash.  Neurological: Negative for dizziness, tremors, weakness, numbness and headaches.  Psychiatric/Behavioral: Negative for confusion and sleep disturbance. The patient is nervous/anxious.     Objective:  Pulse 68   Temp 97.6 F (36.4 C)   Ht 5\' 4"  (1.626 m)   Wt 188 lb (85.3 kg)   SpO2 98%   BMI 32.27 kg/m   BP Readings from Last 3 Encounters:  02/12/16 138/80  02/03/16 136/90  10/04/15 (!) 146/84    Wt Readings from Last 3 Encounters:  11/18/16 188 lb (85.3 kg)  09/25/16 188 lb (85.3 kg)  05/18/16 183 lb (83 kg)    Physical Exam  Constitutional: She appears well-developed. No distress.  HENT:  Head: Normocephalic.  Right Ear: External ear normal.  Left Ear: External ear normal.  Nose: Nose normal.  Mouth/Throat: Oropharynx is clear and moist.  Eyes: Conjunctivae are normal. Pupils are equal, round, and reactive to light. Right eye exhibits no discharge. Left eye exhibits no discharge.  Neck: Normal range of motion. Neck supple. No JVD present. No tracheal deviation present. No thyromegaly present.  Cardiovascular: Normal rate, regular rhythm and normal heart sounds.   Pulmonary/Chest: No stridor. No respiratory distress. She has no wheezes.  Abdominal: Soft. Bowel sounds are normal. She exhibits no distension and no mass. There is no tenderness. There  is no rebound and no guarding.  Musculoskeletal: She exhibits tenderness. She exhibits no edema.  Lymphadenopathy:    She has no cervical adenopathy.  Neurological: She displays normal reflexes. No cranial nerve deficit. She exhibits normal muscle tone. Coordination normal.  Skin: No rash noted. No erythema.  Psychiatric: She has a normal mood and affect. Her behavior is normal. Judgment and thought content normal.  R knee painful w/palpation  Lab Results  Component Value Date   WBC 5.3 05/13/2016   HGB 14.8 05/13/2016   HCT 43.2 05/13/2016   PLT 232.0 05/13/2016   GLUCOSE 93 05/13/2016   CHOL 250 (H)  05/13/2016   TRIG 95.0 05/13/2016   HDL 92.90 05/13/2016   LDLDIRECT 172.9 04/03/2013   LDLCALC 138 (H) 05/13/2016   ALT 24 05/13/2016   AST 25 05/13/2016   NA 141 05/13/2016   K 4.8 05/13/2016   CL 103 05/13/2016   CREATININE 0.85 05/13/2016   BUN 13 05/13/2016   CO2 29 05/13/2016   TSH 3.38 05/13/2016   MICROALBUR <0.7 05/13/2016    Mr Knee Right Wo Contrast  Result Date: 02/14/2016 CLINICAL DATA:  Right knee pain.  Status post fall October 2016. EXAM: MRI OF THE RIGHT KNEE WITHOUT CONTRAST TECHNIQUE: Multiplanar, multisequence MR imaging of the knee was performed. No intravenous contrast was administered. COMPARISON:  None. FINDINGS: MENISCI Medial meniscus:  Intact. Lateral meniscus:  Intact. LIGAMENTS Cruciates:  Intact ACL and PCL. Collaterals: Medial collateral ligament is intact. Lateral collateral ligament complex is intact. CARTILAGE Patellofemoral: Full-thickness cartilage loss of the lateral patellar facet and lateral trochlea with subchondral reactive marrow changes of the lateral patellar facet. High-grade partial-thickness cartilage loss with areas of full-thickness cartilage loss of the medial patellofemoral compartment. Medial: Mild partial-thickness cartilage loss of the medial femoral condyle and medial tibial plateau. Lateral: Partial-thickness cartilage loss of the lateral femoral condyle. Chondromalacia of the lateral tibial plateau. Joint: No joint effusion. Edema in superolateral Hoffa's fat. No plical thickening. Small loose body posterior to the medial joint space measuring 4.8 mm. Popliteal Fossa:  Small Baker cyst.  Intact popliteus tendon. Extensor Mechanism:  Intact. Bones: No other focal marrow signal abnormality. No fracture or dislocation. Other: None IMPRESSION: 1. Tricompartmental cartilage abnormalities as described above, most severe in the patellofemoral compartment. 2. Edema in superolateral Hoffa's fat as can be seen with patellar tendon -lateral femoral  condyle friction syndrome. Electronically Signed   By: Kathreen Devoid   On: 02/14/2016 11:24    Assessment & Plan:   There are no diagnoses linked to this encounter. I am having Ms. Regal maintain her Omega-3 Fatty Acids (FISH OIL PO), cholecalciferol, MAGNESIUM PO, calcium carbonate, aspirin EC, meclizine, cyanocobalamin, diazepam, B Complex Vitamins (B COMPLEX PO), propranolol, terbinafine, omeprazole, and psyllium.  No orders of the defined types were placed in this encounter.    Follow-up: No Follow-up on file.  Walker Kehr, MD

## 2016-11-18 NOTE — Assessment & Plan Note (Signed)
On B12 

## 2016-11-19 ENCOUNTER — Other Ambulatory Visit: Payer: Self-pay | Admitting: Internal Medicine

## 2017-01-13 ENCOUNTER — Ambulatory Visit (INDEPENDENT_AMBULATORY_CARE_PROVIDER_SITE_OTHER): Payer: Medicare Other | Admitting: Nurse Practitioner

## 2017-01-13 ENCOUNTER — Encounter: Payer: Self-pay | Admitting: Nurse Practitioner

## 2017-01-13 VITALS — HR 84 | Temp 98.3°F | Ht 64.0 in | Wt 186.0 lb

## 2017-01-13 DIAGNOSIS — J069 Acute upper respiratory infection, unspecified: Secondary | ICD-10-CM | POA: Diagnosis not present

## 2017-01-13 MED ORDER — BENZONATATE 100 MG PO CAPS: 100.0000 mg | ORAL_CAPSULE | Freq: Three times a day (TID) | ORAL | 0 refills | Status: DC | PRN

## 2017-01-13 MED ORDER — FLUTICASONE PROPIONATE 50 MCG/ACT NA SUSP: 2.0000 | Freq: Every day | NASAL | 0 refills | Status: DC

## 2017-01-13 MED ORDER — DM-GUAIFENESIN ER 30-600 MG PO TB12: 1.0000 | ORAL_TABLET | Freq: Two times a day (BID) | ORAL | 0 refills | Status: DC | PRN

## 2017-01-13 MED ORDER — AZITHROMYCIN 250 MG PO TABS: 250.0000 mg | ORAL_TABLET | Freq: Every day | ORAL | 0 refills | Status: DC

## 2017-01-13 MED ORDER — OXYMETAZOLINE HCL 0.05 % NA SOLN: 1.0000 | Freq: Two times a day (BID) | NASAL | 0 refills | Status: DC

## 2017-01-13 MED ORDER — SALINE SPRAY 0.65 % NA SOLN: 1.0000 | NASAL | 0 refills | Status: DC | PRN

## 2017-01-13 NOTE — Patient Instructions (Signed)
URI Instructions:  Flonase and Afrin use: apply 1spray of afrin in each nare, wait 33mins, then apply 2sprays of flonase in each nare. Use both nasal spray consecutively x 3days, then flonase only for at least 14days.  Encourage adequate oral hydration.  Use over-the-counter  "cold" medicines  such as "Tylenol cold" , "Advil cold",  "Mucinex" or" Mucinex DM"  Or "delsym" or "robitussin" for cough and congestion.   Avoid decongestants if you have high blood pressure.  Use" Delsym" or" Robitussin" cough syrup varietis for cough.  You can use plain "Tylenol" or "Advi"l for fever, chills and achyness.  Start azithromycin if no improvement in 3days.

## 2017-01-13 NOTE — Progress Notes (Signed)
Subjective:  Patient ID: Laura Vasquez, female    DOB: 1946/06/01  Age: 71 y.o. MRN: [id number]  CC: Sinusitis (coughing,sneezing,teeth pain,running nose. going on for 2 days. )   URI   This is a new problem. Episode onset: 2days ago. The problem has been unchanged. There has been no fever. Associated symptoms include congestion, coughing, ear pain, headaches, a plugged ear sensation, rhinorrhea, sinus pain, sneezing and a sore throat. Pertinent negatives include no abdominal pain, chest pain, diarrhea, dysuria, joint pain, joint swelling, nausea, neck pain, rash, swollen glands, vomiting or wheezing. She has tried increased fluids (saline) for the symptoms.   Laura Vasquez declines BP check today.  Home BP readings: 128/74, 131/70, 124/65, 123/68.  Outpatient Medications Prior to Visit  Medication Sig Dispense Refill  . aspirin EC 81 MG tablet Take 81 mg by mouth daily.    . B Complex Vitamins (B COMPLEX PO) Take 1 tablet by mouth daily.    . calcium carbonate (TUMS - DOSED IN MG ELEMENTAL CALCIUM) 500 MG chewable tablet Chew 1 tablet by mouth as needed.     . cholecalciferol (VITAMIN D) 1000 UNITS tablet Take 2,000 Units by mouth daily.     . cyanocobalamin 500 MCG tablet Take 500 mcg by mouth daily.    . diazepam (VALIUM) 5 MG tablet Take 0.5-1 tablets (2.5-5 mg total) by mouth every 12 (twelve) hours as needed for anxiety. 60 tablet 5  . MAGNESIUM PO Take 800 mg by mouth.     . meclizine (ANTIVERT) 25 MG tablet Take 25 mg by mouth as needed.     . Omega-3 Fatty Acids (FISH OIL PO) Take 1-2 each by mouth daily.     Marland Kitchen omeprazole (PRILOSEC) 20 MG capsule take 1 capsule by mouth once daily 30 capsule 5  . propranolol (INDERAL) 10 MG tablet take 1 tablet by mouth twice a day 60 tablet 5  . psyllium (METAMUCIL) 58.6 % packet Take 1 packet by mouth as needed.    . terbinafine (LAMISIL) 250 MG tablet as needed.  98   No facility-administered medications prior to visit.     ROS See  HPI  Objective:  Pulse 84   Temp 98.3 F (36.8 C)   Ht 5\' 4"  (1.626 m)   Wt 186 lb (84.4 kg)   SpO2 98%   BMI 31.93 kg/m   BP Readings from Last 3 Encounters:  11/18/16 139/85  02/12/16 138/80  02/03/16 136/90    Wt Readings from Last 3 Encounters:  01/13/17 186 lb (84.4 kg)  11/18/16 188 lb (85.3 kg)  09/25/16 188 lb (85.3 kg)    Physical Exam  Constitutional: She is oriented to person, place, and time.  HENT:  Right Ear: Tympanic membrane, external ear and ear canal normal.  Left Ear: Tympanic membrane, external ear and ear canal normal.  Nose: Mucosal edema and rhinorrhea present. Right sinus exhibits maxillary sinus tenderness and frontal sinus tenderness. Left sinus exhibits maxillary sinus tenderness and frontal sinus tenderness.  Mouth/Throat: Uvula is midline. No trismus in the jaw. Posterior oropharyngeal erythema present. No oropharyngeal exudate.  Eyes: No scleral icterus.  Neck: Normal range of motion. Neck supple.  Cardiovascular: Normal rate and normal heart sounds.   Pulmonary/Chest: Effort normal and breath sounds normal.  Musculoskeletal: She exhibits no edema.  Lymphadenopathy:    She has no cervical adenopathy.  Neurological: She is alert and oriented to person, place, and time.  Skin: Skin is warm and dry.  Vitals  reviewed.   Lab Results  Component Value Date   WBC 5.3 05/13/2016   HGB 14.8 05/13/2016   HCT 43.2 05/13/2016   PLT 232.0 05/13/2016   GLUCOSE 93 05/13/2016   CHOL 250 (H) 05/13/2016   TRIG 95.0 05/13/2016   HDL 92.90 05/13/2016   LDLDIRECT 172.9 04/03/2013   LDLCALC 138 (H) 05/13/2016   ALT 24 05/13/2016   AST 25 05/13/2016   NA 141 05/13/2016   K 4.8 05/13/2016   CL 103 05/13/2016   CREATININE 0.85 05/13/2016   BUN 13 05/13/2016   CO2 29 05/13/2016   TSH 3.38 05/13/2016   MICROALBUR <0.7 05/13/2016    Mr Knee Right Wo Contrast  Result Date: 02/14/2016 CLINICAL DATA:  Right knee pain.  Status post fall October 2016.  EXAM: MRI OF THE RIGHT KNEE WITHOUT CONTRAST TECHNIQUE: Multiplanar, multisequence MR imaging of the knee was performed. No intravenous contrast was administered. COMPARISON:  None. FINDINGS: MENISCI Medial meniscus:  Intact. Lateral meniscus:  Intact. LIGAMENTS Cruciates:  Intact ACL and PCL. Collaterals: Medial collateral ligament is intact. Lateral collateral ligament complex is intact. CARTILAGE Patellofemoral: Full-thickness cartilage loss of the lateral patellar facet and lateral trochlea with subchondral reactive marrow changes of the lateral patellar facet. High-grade partial-thickness cartilage loss with areas of full-thickness cartilage loss of the medial patellofemoral compartment. Medial: Mild partial-thickness cartilage loss of the medial femoral condyle and medial tibial plateau. Lateral: Partial-thickness cartilage loss of the lateral femoral condyle. Chondromalacia of the lateral tibial plateau. Joint: No joint effusion. Edema in superolateral Hoffa's fat. No plical thickening. Small loose body posterior to the medial joint space measuring 4.8 mm. Popliteal Fossa:  Small Baker cyst.  Intact popliteus tendon. Extensor Mechanism:  Intact. Bones: No other focal marrow signal abnormality. No fracture or dislocation. Other: None IMPRESSION: 1. Tricompartmental cartilage abnormalities as described above, most severe in the patellofemoral compartment. 2. Edema in superolateral Hoffa's fat as can be seen with patellar tendon -lateral femoral condyle friction syndrome. Electronically Signed   By: Kathreen Devoid   On: 02/14/2016 11:24    Assessment & Plan:   Laura Vasquez was seen today for sinusitis.  Diagnoses and all orders for this visit:  Acute URI -     dextromethorphan-guaiFENesin (MUCINEX DM) 30-600 MG 12hr tablet; Take 1 tablet by mouth 2 (two) times daily as needed for cough. -     sodium chloride (OCEAN) 0.65 % SOLN nasal spray; Place 1 spray into both nostrils as needed for congestion. -      oxymetazoline (AFRIN NASAL SPRAY) 0.05 % nasal spray; Place 1 spray into both nostrils 2 (two) times daily. Use only for 3days, then stop -     benzonatate (TESSALON) 100 MG capsule; Take 1 capsule (100 mg total) by mouth 3 (three) times daily as needed for cough. -     fluticasone (FLONASE) 50 MCG/ACT nasal spray; Place 2 sprays into both nostrils daily. -     azithromycin (ZITHROMAX Z-PAK) 250 MG tablet; Take 1 tablet (250 mg total) by mouth daily. Take 2tabs on first day, then 1tab once a day till complete   I have discontinued Laura Vasquez's fluticasone. I am also having her start on dextromethorphan-guaiFENesin, sodium chloride, oxymetazoline, benzonatate, fluticasone, and azithromycin. Additionally, I am having her maintain her Omega-3 Fatty Acids (FISH OIL PO), cholecalciferol, MAGNESIUM PO, calcium carbonate, aspirin EC, meclizine, cyanocobalamin, diazepam, B Complex Vitamins (B COMPLEX PO), terbinafine, omeprazole, psyllium, and propranolol.  Meds ordered this encounter  Medications  . dextromethorphan-guaiFENesin (  MUCINEX DM) 30-600 MG 12hr tablet    Sig: Take 1 tablet by mouth 2 (two) times daily as needed for cough.    Dispense:  14 tablet    Refill:  0    Order Specific Question:   Supervising Provider    Answer:   Cassandria Anger [[id number]]  . sodium chloride (OCEAN) 0.65 % SOLN nasal spray    Sig: Place 1 spray into both nostrils as needed for congestion.    Dispense:  15 mL    Refill:  0    Order Specific Question:   Supervising Provider    Answer:   Cassandria Anger [[id number]]  . oxymetazoline (AFRIN NASAL SPRAY) 0.05 % nasal spray    Sig: Place 1 spray into both nostrils 2 (two) times daily. Use only for 3days, then stop    Dispense:  30 mL    Refill:  0    Order Specific Question:   Supervising Provider    Answer:   Cassandria Anger [[id number]]  . benzonatate (TESSALON) 100 MG capsule    Sig: Take 1 capsule (100 mg total) by mouth 3 (three) times daily as needed for  cough.    Dispense:  20 capsule    Refill:  0    Order Specific Question:   Supervising Provider    Answer:   Cassandria Anger [[id number]]  . fluticasone (FLONASE) 50 MCG/ACT nasal spray    Sig: Place 2 sprays into both nostrils daily.    Dispense:  16 g    Refill:  0    Order Specific Question:   Supervising Provider    Answer:   Cassandria Anger [[id number]]  . azithromycin (ZITHROMAX Z-PAK) 250 MG tablet    Sig: Take 1 tablet (250 mg total) by mouth daily. Take 2tabs on first day, then 1tab once a day till complete    Dispense:  6 tablet    Refill:  0    Order Specific Question:   Supervising Provider    Answer:   Cassandria Anger [[id number]]    Follow-up: Return if symptoms worsen or fail to improve.  Wilfred Lacy, NP

## 2017-01-18 ENCOUNTER — Other Ambulatory Visit: Payer: Self-pay | Admitting: Obstetrics and Gynecology

## 2017-01-18 DIAGNOSIS — Z1231 Encounter for screening mammogram for malignant neoplasm of breast: Secondary | ICD-10-CM

## 2017-01-26 ENCOUNTER — Ambulatory Visit
Admission: RE | Admit: 2017-01-26 | Discharge: 2017-01-26 | Disposition: A | Discharge status: Home / Self Care | Payer: Medicare Other | Source: Ambulatory Visit | Attending: Obstetrics and Gynecology | Admitting: Obstetrics and Gynecology

## 2017-01-26 DIAGNOSIS — Z1231 Encounter for screening mammogram for malignant neoplasm of breast: Secondary | ICD-10-CM

## 2017-02-16 ENCOUNTER — Other Ambulatory Visit: Payer: Self-pay | Admitting: Internal Medicine

## 2017-03-29 ENCOUNTER — Telehealth: Payer: Self-pay | Admitting: Internal Medicine

## 2017-03-29 NOTE — Telephone Encounter (Signed)
Called pt to schedule AWV. Pt stated that she would like to look at her schedule first before she makes an appt and she will call the office when she is ready to schedule her awv.

## 2017-05-17 ENCOUNTER — Other Ambulatory Visit: Payer: Self-pay | Admitting: Internal Medicine

## 2017-05-21 ENCOUNTER — Other Ambulatory Visit (INDEPENDENT_AMBULATORY_CARE_PROVIDER_SITE_OTHER): Payer: Medicare Other

## 2017-05-21 ENCOUNTER — Telehealth: Payer: Self-pay

## 2017-05-21 DIAGNOSIS — Z6833 Body mass index (BMI) 33.0-33.9, adult: Secondary | ICD-10-CM

## 2017-05-21 DIAGNOSIS — I10 Essential (primary) hypertension: Secondary | ICD-10-CM

## 2017-05-21 DIAGNOSIS — E538 Deficiency of other specified B group vitamins: Secondary | ICD-10-CM

## 2017-05-21 DIAGNOSIS — R000 Tachycardia, unspecified: Secondary | ICD-10-CM | POA: Diagnosis not present

## 2017-05-21 DIAGNOSIS — E785 Hyperlipidemia, unspecified: Secondary | ICD-10-CM | POA: Diagnosis not present

## 2017-05-21 DIAGNOSIS — F411 Generalized anxiety disorder: Secondary | ICD-10-CM | POA: Diagnosis not present

## 2017-05-21 DIAGNOSIS — E6609 Other obesity due to excess calories: Secondary | ICD-10-CM | POA: Diagnosis not present

## 2017-05-21 LAB — HEPATIC FUNCTION PANEL
ALBUMIN: 4.5 g/dL (ref 3.5–5.2)
ALK PHOS: 39 U/L (ref 39–117)
ALT: 27 U/L (ref 0–35)
AST: 27 U/L (ref 0–37)
BILIRUBIN DIRECT: 0.2 mg/dL (ref 0.0–0.3)
TOTAL PROTEIN: 7.3 g/dL (ref 6.0–8.3)
Total Bilirubin: 0.9 mg/dL (ref 0.2–1.2)

## 2017-05-21 LAB — CBC WITH DIFFERENTIAL/PLATELET
Basophils Absolute: 0 10*3/uL (ref 0.0–0.1)
Basophils Relative: 0.3 % (ref 0.0–3.0)
EOS ABS: 0.1 10*3/uL (ref 0.0–0.7)
EOS PCT: 1.5 % (ref 0.0–5.0)
HCT: 44.2 % (ref 36.0–46.0)
Hemoglobin: 14.6 g/dL (ref 12.0–15.0)
LYMPHS ABS: 1.5 10*3/uL (ref 0.7–4.0)
Lymphocytes Relative: 36.6 % (ref 12.0–46.0)
MCHC: 33.1 g/dL (ref 30.0–36.0)
MCV: 92.5 fl (ref 78.0–100.0)
MONO ABS: 0.3 10*3/uL (ref 0.1–1.0)
Monocytes Relative: 7.7 % (ref 3.0–12.0)
NEUTROS PCT: 53.9 % (ref 43.0–77.0)
Neutro Abs: 2.2 10*3/uL (ref 1.4–7.7)
Platelets: 217 10*3/uL (ref 150.0–400.0)
RBC: 4.78 Mil/uL (ref 3.87–5.11)
RDW: 14.3 % (ref 11.5–15.5)
WBC: 4.2 10*3/uL (ref 4.0–10.5)

## 2017-05-21 LAB — MICROALBUMIN / CREATININE URINE RATIO
Creatinine,U: 35.1 mg/dL
MICROALB/CREAT RATIO: 2 mg/g (ref 0.0–30.0)
Microalb, Ur: <0.7 mg/dL (ref 0.0–1.9)

## 2017-05-21 LAB — BASIC METABOLIC PANEL
BUN: 8 mg/dL (ref 6–23)
CALCIUM: 9.6 mg/dL (ref 8.4–10.5)
CO2: 27 mEq/L (ref 19–32)
Chloride: 103 mEq/L (ref 96–112)
Creatinine, Ser: 0.73 mg/dL (ref 0.40–1.20)
GFR: 82.99 mL/min (ref >60.00)
Glucose, Bld: 104 mg/dL — ABNORMAL HIGH (ref 70–99)
Potassium: 4.8 mEq/L (ref 3.5–5.1)
Sodium: 139 mEq/L (ref 135–145)

## 2017-05-21 LAB — LIPID PANEL
Cholesterol: 242 mg/dL — ABNORMAL HIGH (ref 0–200)
HDL: 93.6 mg/dL (ref >39.00)
LDL CALC: 132 mg/dL — AB (ref 0–99)
NONHDL: 148.13
Total CHOL/HDL Ratio: 3
Triglycerides: 81 mg/dL (ref 0.0–149.0)
VLDL: 16.2 mg/dL (ref 0.0–40.0)

## 2017-05-21 LAB — TSH: TSH: 2.97 u[IU]/mL (ref 0.35–4.50)

## 2017-05-21 LAB — VITAMIN B12: Vitamin B-12: 1004 pg/mL — ABNORMAL HIGH (ref 211–911)

## 2017-05-21 NOTE — Telephone Encounter (Signed)
Future orders put in/notified Kassie at lab/thx dmf

## 2017-05-21 NOTE — Telephone Encounter (Signed)
AP-This patient has an appt with you on 10.09.18/she is down in the lab checked in right now and they have no orders/would you like for me to duplicate the orders form 9.27.17? Microalbumin/Creat Vit-B12 TSH Lipid CBC w/Diff BMP Hepatic Function Panel?  Plz advise/thx dmf

## 2017-05-21 NOTE — Telephone Encounter (Signed)
Yes, please Thx

## 2017-05-25 ENCOUNTER — Encounter: Payer: Self-pay | Admitting: Internal Medicine

## 2017-05-25 ENCOUNTER — Ambulatory Visit (INDEPENDENT_AMBULATORY_CARE_PROVIDER_SITE_OTHER): Payer: Medicare Other | Admitting: Internal Medicine

## 2017-05-25 VITALS — HR 75 | Temp 98.5°F | Ht 64.0 in | Wt 188.0 lb

## 2017-05-25 DIAGNOSIS — I10 Essential (primary) hypertension: Secondary | ICD-10-CM

## 2017-05-25 DIAGNOSIS — R002 Palpitations: Secondary | ICD-10-CM

## 2017-05-25 DIAGNOSIS — K589 Irritable bowel syndrome without diarrhea: Secondary | ICD-10-CM | POA: Diagnosis not present

## 2017-05-25 DIAGNOSIS — Z23 Encounter for immunization: Secondary | ICD-10-CM

## 2017-05-25 DIAGNOSIS — E538 Deficiency of other specified B group vitamins: Secondary | ICD-10-CM | POA: Diagnosis not present

## 2017-05-25 DIAGNOSIS — K219 Gastro-esophageal reflux disease without esophagitis: Secondary | ICD-10-CM | POA: Diagnosis not present

## 2017-05-25 MED ORDER — OMEPRAZOLE 20 MG PO CPDR: DELAYED_RELEASE_CAPSULE | ORAL | 11 refills | Status: DC

## 2017-05-25 NOTE — Assessment & Plan Note (Signed)
On B12 

## 2017-05-25 NOTE — Assessment & Plan Note (Signed)
  On diet  

## 2017-05-25 NOTE — Patient Instructions (Signed)
[facility] Well w/Jill 

## 2017-05-25 NOTE — Assessment & Plan Note (Signed)
Inderal prn

## 2017-05-25 NOTE — Assessment & Plan Note (Signed)
On Propranolol 

## 2017-05-25 NOTE — Progress Notes (Signed)
Subjective:  Patient ID: Laura Vasquez, female    DOB: [birth year]  Age: 71 y.o. MRN: [id number]  CC: No chief complaint on file.   HPI Eliyah Mcshea [facility] presents for GERD, anxiety, IBS f/u BP - nl at [place]  Outpatient Medications Prior to Visit  Medication Sig Dispense Refill  . aspirin EC 81 MG tablet Take 81 mg by mouth daily.    Marland Kitchen azithromycin (ZITHROMAX Z-PAK) 250 MG tablet Take 1 tablet (250 mg total) by mouth daily. Take 2tabs on first day, then 1tab once a day till complete 6 tablet 0  . B Complex Vitamins (B COMPLEX PO) Take 1 tablet by mouth daily.    . benzonatate (TESSALON) 100 MG capsule Take 1 capsule (100 mg total) by mouth 3 (three) times daily as needed for cough. 20 capsule 0  . calcium carbonate (TUMS - DOSED IN MG ELEMENTAL CALCIUM) 500 MG chewable tablet Chew 1 tablet by mouth as needed.     . cholecalciferol (VITAMIN D) 1000 UNITS tablet Take 2,000 Units by mouth daily.     . cyanocobalamin 500 MCG tablet Take 500 mcg by mouth daily.    Marland Kitchen dextromethorphan-guaiFENesin (MUCINEX DM) 30-600 MG 12hr tablet Take 1 tablet by mouth 2 (two) times daily as needed for cough. 14 tablet 0  . diazepam (VALIUM) 5 MG tablet Take 0.5-1 tablets (2.5-5 mg total) by mouth every 12 (twelve) hours as needed for anxiety. 60 tablet 5  . fluticasone (FLONASE) 50 MCG/ACT nasal spray Place 2 sprays into both nostrils daily. 16 g 0  . MAGNESIUM PO Take 800 mg by mouth.     . meclizine (ANTIVERT) 25 MG tablet Take 25 mg by mouth as needed.     . Omega-3 Fatty Acids (FISH OIL PO) Take 1-2 each by mouth daily.     Marland Kitchen omeprazole (PRILOSEC) 20 MG capsule take 1 capsule by mouth once daily **ANNUAL APPOINTMENT DUE FOR SEPTEMBER. MUST SEE MD FOR REFILLS 30 capsule 11  . oxymetazoline (AFRIN NASAL SPRAY) 0.05 % nasal spray Place 1 spray into both nostrils 2 (two) times daily. Use only for 3days, then stop 30 mL 0  . propranolol (INDERAL) 10 MG tablet take 1 tablet by mouth twice a day 60 tablet 5  .  psyllium (METAMUCIL) 58.6 % packet Take 1 packet by mouth as needed.    . sodium chloride (OCEAN) 0.65 % SOLN nasal spray Place 1 spray into both nostrils as needed for congestion. 15 mL 0  . terbinafine (LAMISIL) 250 MG tablet as needed.  98   No facility-administered medications prior to visit.     ROS Review of Systems  Constitutional: Negative for activity change, appetite change, chills, fatigue and unexpected weight change.  HENT: Negative for congestion, mouth sores and sinus pressure.   Eyes: Negative for visual disturbance.  Respiratory: Negative for cough and chest tightness.   Cardiovascular: Positive for palpitations. Negative for chest pain.  Gastrointestinal: Negative for abdominal pain and nausea.  Genitourinary: Negative for difficulty urinating, frequency and vaginal pain.  Musculoskeletal: Positive for arthralgias. Negative for back pain and gait problem.  Skin: Negative for pallor and rash.  Neurological: Negative for dizziness, tremors, weakness, numbness and headaches.  Psychiatric/Behavioral: Negative for confusion and sleep disturbance. The patient is nervous/anxious.     Objective:  Pulse 75   Temp 98.5 F (36.9 C) (Oral)   Ht 5\' 4"  (1.626 m)   Wt 188 lb (85.3 kg)   SpO2 99%  BMI 32.27 kg/m   BP Readings from Last 3 Encounters:  11/18/16 139/85  02/12/16 138/80  02/03/16 136/90    Wt Readings from Last 3 Encounters:  05/25/17 188 lb (85.3 kg)  01/13/17 186 lb (84.4 kg)  11/18/16 188 lb (85.3 kg)    Physical Exam  Constitutional: She appears well-developed. No distress.  HENT:  Head: Normocephalic.  Right Ear: External ear normal.  Left Ear: External ear normal.  Nose: Nose normal.  Mouth/Throat: Oropharynx is clear and moist.  Eyes: Pupils are equal, round, and reactive to light. Conjunctivae are normal. Right eye exhibits no discharge. Left eye exhibits no discharge.  Neck: Normal range of motion. Neck supple. No JVD present. No tracheal  deviation present. No thyromegaly present.  Cardiovascular: Normal rate, regular rhythm and normal heart sounds.   Pulmonary/Chest: No stridor. No respiratory distress. She has no wheezes.  Abdominal: Soft. Bowel sounds are normal. She exhibits no distension and no mass. There is no tenderness. There is no rebound and no guarding.  Musculoskeletal: She exhibits no edema or tenderness.  Lymphadenopathy:    She has no cervical adenopathy.  Neurological: She displays normal reflexes. No cranial nerve deficit. She exhibits normal muscle tone. Coordination normal.  Skin: No rash noted. No erythema.  Psychiatric: She has a normal mood and affect. Her behavior is normal. Judgment and thought content normal.    Lab Results  Component Value Date   WBC 4.2 05/21/2017   HGB 14.6 05/21/2017   HCT 44.2 05/21/2017   PLT 217.0 05/21/2017   GLUCOSE 104 (H) 05/21/2017   CHOL 242 (H) 05/21/2017   TRIG 81.0 05/21/2017   HDL 93.60 05/21/2017   LDLDIRECT 172.9 04/03/2013   LDLCALC 132 (H) 05/21/2017   ALT 27 05/21/2017   AST 27 05/21/2017   NA 139 05/21/2017   K 4.8 05/21/2017   CL 103 05/21/2017   CREATININE 0.73 05/21/2017   BUN 8 05/21/2017   CO2 27 05/21/2017   TSH 2.97 05/21/2017   MICROALBUR <0.7 05/21/2017    Mm Digital Screening Bilateral  Result Date: 01/26/2017 CLINICAL DATA:  Screening. EXAM: DIGITAL SCREENING BILATERAL MAMMOGRAM WITH CAD COMPARISON:  Previous exam(s). ACR Breast Density Category c: The breast tissue is heterogeneously dense, which may obscure small masses. FINDINGS: There are no findings suspicious for malignancy. Images were processed with CAD. IMPRESSION: No mammographic evidence of malignancy. A result letter of this screening mammogram will be mailed directly to the patient. RECOMMENDATION: Screening mammogram in one year. (Code:SM-B-01Y) BI-RADS CATEGORY  1: Negative. Electronically Signed   By: Franki Cabot M.D.   On: 01/26/2017 11:36    Assessment & Plan:    There are no diagnoses linked to this encounter. I am having Ms. Grabe maintain her Omega-3 Fatty Acids (FISH OIL PO), cholecalciferol, MAGNESIUM PO, calcium carbonate, aspirin EC, meclizine, cyanocobalamin, diazepam, B Complex Vitamins (B COMPLEX PO), terbinafine, psyllium, propranolol, dextromethorphan-guaiFENesin, sodium chloride, oxymetazoline, benzonatate, fluticasone, azithromycin, and omeprazole.  No orders of the defined types were placed in this encounter.    Follow-up: No Follow-up on file.  Walker Kehr, MD

## 2017-05-25 NOTE — Assessment & Plan Note (Signed)
Omeprazole

## 2017-05-26 NOTE — Addendum Note (Signed)
Addended by: Karren Cobble on: 05/26/2017 12:09 PM   Modules accepted: Orders

## 2017-09-08 ENCOUNTER — Telehealth: Payer: Self-pay | Admitting: *Deleted

## 2017-09-08 NOTE — Telephone Encounter (Signed)
Called patient to answer questions she had regarding AWV and a Medicare check she received last year for completing an AWV. Nurse answered all of the patient's questions and requested that she contact nurse if she had any further questions or concerns. Patient opted not to schedule an AWV at this time.

## 2017-10-10 ENCOUNTER — Encounter (HOSPITAL_COMMUNITY): Payer: Self-pay | Admitting: Emergency Medicine

## 2017-10-10 ENCOUNTER — Emergency Department (HOSPITAL_COMMUNITY)
Admission: EM | Admit: 2017-10-10 | Discharge: 2017-10-10 | Disposition: A | Discharge status: Home / Self Care | Payer: Medicare Other | Attending: Emergency Medicine | Admitting: Emergency Medicine

## 2017-10-10 DIAGNOSIS — Z79899 Other long term (current) drug therapy: Secondary | ICD-10-CM | POA: Diagnosis not present

## 2017-10-10 DIAGNOSIS — Z7982 Long term (current) use of aspirin: Secondary | ICD-10-CM | POA: Diagnosis not present

## 2017-10-10 DIAGNOSIS — I10 Essential (primary) hypertension: Secondary | ICD-10-CM | POA: Diagnosis not present

## 2017-10-10 DIAGNOSIS — Z87891 Personal history of nicotine dependence: Secondary | ICD-10-CM | POA: Diagnosis not present

## 2017-10-10 LAB — I-STAT CHEM 8, ED
BUN: 10 mg/dL (ref 6–20)
CALCIUM ION: 1.18 mmol/L (ref 1.15–1.40)
CHLORIDE: 103 mmol/L (ref 101–111)
Creatinine, Ser: 0.7 mg/dL (ref 0.44–1.00)
GLUCOSE: 94 mg/dL (ref 65–99)
HCT: 47 % — ABNORMAL HIGH (ref 36.0–46.0)
Hemoglobin: 16 g/dL — ABNORMAL HIGH (ref 12.0–15.0)
Potassium: 3.7 mmol/L (ref 3.5–5.1)
SODIUM: 140 mmol/L (ref 135–145)
TCO2: 25 mmol/L (ref 22–32)

## 2017-10-10 NOTE — ED Triage Notes (Signed)
Pt to ER for evaluation of hypertension "high readings." readings at home 486-282 systolic, pt in NAD. States took her prescribed diazepam and propanolol without relief. Patient states she has consumed a lot of salt over the last 3 days.

## 2017-10-10 NOTE — Discharge Instructions (Signed)
Continue your propranolol medication.  Follow-up with your primary care doctor next week to recheck on her blood pressure

## 2017-10-10 NOTE — ED Provider Notes (Signed)
[facility] [facility] EMERGENCY DEPARTMENT Provider Note   CSN: [id number] Arrival date & time: 10/10/17  1426     History   Chief Complaint Chief Complaint  Patient presents with  . Hypertension    HPI Laura Vasquez is a 72 y.o. female.  HPI Pt has a history of htn.  She has been having trouble with her blood pressure the last few days.  In the past she has been told she can take diazepam or propranolol when her BP is high.  Sometimes stress is a factor in her blood pressure.  She took her propanolol and diazepam yesterday but her blood pressure remained high.  She often gets fatigued by the propranolol so she does not take the BP med regularly.  Sometimes her blood pressure is fine so she does not always take it regularly.  She often feels that her anxiety contributes to her blood pressure. No cp or sob.  No weakness. Past Medical History:  Diagnosis Date  . Allergy   . Anxiety   . Arrhythmia   . Diverticulosis   . Erosive esophagitis   . Fatty liver   . Fibromyalgia   . GERD (gastroesophageal reflux disease)   . Hiatal hernia   . Hyperlipidemia   . Hyperplastic colon polyp   . Hypertension   . IBS (irritable bowel syndrome)   . Mitral regurgitation   . Osteoarthritis   . Palpitations   . Peptic stricture of esophagus   . Vitamin B12 deficiency     Patient Active Problem List   Diagnosis Date Noted  . Difficulty walking 11/21/2015  . Degenerative arthritis of right knee 10/04/2015  . Vulvitis 08/14/2015  . Patellar contusion 06/11/2015  . Snoring 11/08/2013  . Elevated hemoglobin ([place]) 10/11/2013  . LLQ abdominal pain 10/10/2013  . Well adult exam 03/25/2012  . Cough 02/03/2012  . Acute sinus infection 01/23/2012  . Vertigo 01/08/2012  . Diverticulosis of large intestine 06/24/2011  . Special screening for malignant neoplasms, colon 06/24/2011  . DIASTOLIC DYSFUNCTION 94/85/4627  . Palpitations 12/16/2009  . ALLERGIC RHINITIS 11/21/2009  .  Generalized anxiety disorder 04/29/2009  . Hemoptysis 04/26/2009  . FATIGUE 09/04/2008  . GRIEF REACTION 06/01/2008  . PHARYNGITIS 01/24/2008  . ARM PAIN, LEFT 01/24/2008  . CERVICAL STRAIN 01/24/2008  . Essential hypertension 11/14/2007  . ELEVATED BP 11/14/2007  . POLYP, COLON 11/04/2007  . Obesity 11/04/2007  . EROSIVE ESOPHAGITIS 11/04/2007  . GERD 11/04/2007  . PEPTIC STRICTURE 11/04/2007  . UPPER RESPIRATORY INFECTION (URI) 07/12/2007  . LOW BACK PAIN 07/12/2007  . SWEATING 07/12/2007  . Tachycardia 07/12/2007  . COLONIC POLYPS, HX OF 07/12/2007  . B12 deficiency 05/26/2007  . HYPERLIPIDEMIA 05/26/2007  . MITRAL VALVE PROLAPSE 05/26/2007  . Irritable bowel syndrome 05/26/2007  . MENOPAUSAL SYNDROME 05/26/2007  . FIBROMYALGIA 05/26/2007    Past Surgical History:  Procedure Laterality Date  . ABDOMINAL HYSTERECTOMY    . APPENDECTOMY    . CHOLECYSTECTOMY    . RECTOCELE REPAIR    . TUBAL LIGATION      OB History    No data available       Home Medications    Prior to Admission medications   Medication Sig Start Date End Date Taking? Authorizing Provider  aspirin EC 81 MG tablet Take 81 mg by mouth daily.   Yes [provider]  B Complex Vitamins (B COMPLEX PO) Take 1 tablet by mouth daily.   Yes [provider]  calcium  carbonate (TUMS - DOSED IN MG ELEMENTAL CALCIUM) 500 MG chewable tablet Chew 1 tablet by mouth as needed.    Yes [provider]  cholecalciferol (VITAMIN D) 1000 UNITS tablet Take 2,000 Units by mouth daily.    Yes [provider]  cyanocobalamin 500 MCG tablet Take 500 mcg by mouth daily.   Yes [provider]  diazepam (VALIUM) 5 MG tablet Take 0.5-1 tablets (2.5-5 mg total) by mouth every 12 (twelve) hours as needed for anxiety. 07/16/14  Yes Plotnikov, Evie Lacks, MD  fluticasone (FLONASE) 50 MCG/ACT nasal spray Place 2 sprays into both nostrils daily. 01/13/17  Yes Nche, Charlene Brooke, NP    MAGNESIUM PO Take 800 mg by mouth.    Yes [provider]  meclizine (ANTIVERT) 25 MG tablet Take 25 mg by mouth as needed.    Yes [provider]  Omega-3 Fatty Acids (FISH OIL PO) Take 1-2 each by mouth daily.    Yes [provider]  omeprazole (PRILOSEC) 20 MG capsule take 1 capsule by mouth once daily 05/25/17  Yes Plotnikov, Evie Lacks, MD  oxymetazoline (AFRIN NASAL SPRAY) 0.05 % nasal spray Place 1 spray into both nostrils 2 (two) times daily. Use only for 3days, then stop 01/13/17  Yes Nche, Charlene Brooke, NP  propranolol (INDERAL) 10 MG tablet take 1 tablet by mouth twice a day 11/19/16  Yes Plotnikov, Evie Lacks, MD  psyllium (METAMUCIL) 58.6 % packet Take 1 packet by mouth as needed.   Yes [provider]  sodium chloride (OCEAN) 0.65 % SOLN nasal spray Place 1 spray into both nostrils as needed for congestion. 01/13/17  Yes Nche, Charlene Brooke, NP  azithromycin (ZITHROMAX Z-PAK) 250 MG tablet Take 1 tablet (250 mg total) by mouth daily. Take 2tabs on first day, then 1tab once a day till complete Patient not taking: Reported on 10/10/2017 01/15/17   Nche, Charlene Brooke, NP  benzonatate (TESSALON) 100 MG capsule Take 1 capsule (100 mg total) by mouth 3 (three) times daily as needed for cough. Patient not taking: Reported on 10/10/2017 01/13/17   Nche, Charlene Brooke, NP  dextromethorphan-guaiFENesin [facility] DM) 30-600 MG 12hr tablet Take 1 tablet by mouth 2 (two) times daily as needed for cough. Patient not taking: Reported on 10/10/2017 01/13/17   Nche, Charlene Brooke, NP    Family History Family History  Problem Relation Age of Onset  . Hypertension Mother   . Ovarian cancer Mother   . Hypertension Father   . Kidney disease Sister        ESRD  . Fibromyalgia Sister        x 2  . Diabetes Sister   . Breast cancer Maternal Grandmother   . Colon cancer Neg Hx     Social History Social History   Tobacco Use  . Smoking status: Former Smoker    Last  attempt to quit: 08/18/1991    Years since quitting: 26.1  . Smokeless tobacco: Never Used  Substance Use Topics  . Alcohol use: Yes    Alcohol/week: 0.0 oz    Comment: 1-2 glasses   . Drug use: No     Allergies   Amoxicillin-pot clavulanate; Ceftin [cefuroxime axetil]; Codeine; Naproxen; and Sulfonamide derivatives   Review of Systems Review of Systems  All other systems reviewed and are negative.    Physical Exam Updated Vital Signs BP (!) 147/99   Pulse 63   Temp 98 F (36.7 C) (Oral)   Resp (!) 23  Ht 1.626 m (5\' 4" )   Wt 83.9 kg (185 lb)   SpO2 98%   BMI 31.76 kg/m   Physical Exam  Constitutional: She appears well-developed and well-nourished. No distress.  HENT:  Head: Normocephalic and atraumatic.  Right Ear: External ear normal.  Left Ear: External ear normal.  Eyes: Conjunctivae are normal. Right eye exhibits no discharge. Left eye exhibits no discharge. No scleral icterus.  Neck: Neck supple. No tracheal deviation present.  Cardiovascular: Normal rate, regular rhythm and intact distal pulses.  Pulmonary/Chest: Effort normal and breath sounds normal. No stridor. No respiratory distress. She has no wheezes. She has no rales.  Abdominal: Soft. Bowel sounds are normal. She exhibits no distension. There is no tenderness. There is no rebound and no guarding.  Musculoskeletal: She exhibits no edema or tenderness.  Neurological: She is alert. She has normal strength. No cranial nerve deficit (no facial droop, extraocular movements intact, no slurred speech) or sensory deficit. She exhibits normal muscle tone. She displays no seizure activity. Coordination normal.  Skin: Skin is warm and dry. No rash noted.  Psychiatric: She has a normal mood and affect.  Nursing note and vitals reviewed.    ED Treatments / Results  Labs (all labs ordered are listed, but only abnormal results are displayed) Labs Reviewed  I-STAT CHEM 8, ED - Abnormal; Notable for the following  components:      Result Value   Hemoglobin 16.0 (*)    HCT 47.0 (*)    All other components within normal limits    EKG  EKG Interpretation  Date/Time:  Sunday October 10 2017 15:40:44 EST Ventricular Rate:  60 PR Interval:    QRS Duration: 92 QT Interval:  425 QTC Calculation: 425 R Axis:   4 Text Interpretation:  Sinus rhythm Low voltage, precordial leads Abnormal R-wave progression, early transition Borderline T abnormalities, anterior leads No old tracing to compare Confirmed by Dorie Rank [phone]) on 10/10/2017 4:14:07 PM       Radiology No results found.  Procedures Procedures (including critical care time)  Medications Ordered in ED Medications - No data to display   Initial Impression / Assessment and Plan / ED Course  I have reviewed the triage vital signs and the nursing notes.  Pertinent labs & imaging results that were available during my care of the patient were reviewed by me and considered in my medical decision making (see chart for details).  Clinical Course as of Oct 10 1704  Sun Oct 10, 2017  1528 Pt would like to take a dose of her 10 mg propranolol vs me ordering it  [JK]    Clinical Course User Index [JK] Dorie Rank, MD    Patient presented to the emergency room for evaluation intention.  No symptoms to suggest endorgan ischemia.  Patient is not having any chest pain or headache.  She is asymptomatic.  Her blood pressure is 147/99 now.  She did take a dose of her home propranolol while she was here.  Patient admits that she does not regularly take her blood pressure medications.  I recommend she take her dose as prescribed.  She can follow-up with her primary care doctor this week.  Final Clinical Impressions(s) / ED Diagnoses   Final diagnoses:  Essential hypertension    ED Discharge Orders    None       Dorie Rank, MD 10/10/17 [phone]

## 2017-10-14 ENCOUNTER — Encounter: Payer: Self-pay | Admitting: Internal Medicine

## 2017-10-14 ENCOUNTER — Ambulatory Visit (INDEPENDENT_AMBULATORY_CARE_PROVIDER_SITE_OTHER): Payer: Medicare Other | Admitting: Internal Medicine

## 2017-10-14 VITALS — HR 65 | Temp 98.1°F | Ht 64.0 in | Wt 187.0 lb

## 2017-10-14 DIAGNOSIS — M797 Fibromyalgia: Secondary | ICD-10-CM | POA: Diagnosis not present

## 2017-10-14 DIAGNOSIS — F411 Generalized anxiety disorder: Secondary | ICD-10-CM | POA: Diagnosis not present

## 2017-10-14 DIAGNOSIS — I10 Essential (primary) hypertension: Secondary | ICD-10-CM | POA: Diagnosis not present

## 2017-10-14 DIAGNOSIS — E538 Deficiency of other specified B group vitamins: Secondary | ICD-10-CM

## 2017-10-14 MED ORDER — DIAZEPAM 5 MG PO TABS: 2.5000 mg | ORAL_TABLET | Freq: Two times a day (BID) | ORAL | 5 refills | Status: DC | PRN

## 2017-10-14 NOTE — Assessment & Plan Note (Signed)
Discussed.

## 2017-10-14 NOTE — Assessment & Plan Note (Addendum)
On Propranolol - increase to BID

## 2017-10-14 NOTE — Progress Notes (Signed)
Subjective:  Patient ID: Laura Vasquez, female    DOB: 07/22/1946  Age: 72 y.o. MRN: [id number]  CC: No chief complaint on file.   HPI Laura Vasquez [facility] presents for elevated BP of 170/100 at home on 10/10/17 ER BP was 147/99 C/o stress  Outpatient Medications Prior to Visit  Medication Sig Dispense Refill  . aspirin EC 81 MG tablet Take 81 mg by mouth daily.    . B Complex Vitamins (B COMPLEX PO) Take 1 tablet by mouth daily.    . calcium carbonate (TUMS - DOSED IN MG ELEMENTAL CALCIUM) 500 MG chewable tablet Chew 1 tablet by mouth as needed.     . cholecalciferol (VITAMIN D) 1000 UNITS tablet Take 2,000 Units by mouth daily.     . cyanocobalamin 500 MCG tablet Take 500 mcg by mouth daily.    . diazepam (VALIUM) 5 MG tablet Take 0.5-1 tablets (2.5-5 mg total) by mouth every 12 (twelve) hours as needed for anxiety. 60 tablet 5  . MAGNESIUM PO Take 800 mg by mouth.     . meclizine (ANTIVERT) 25 MG tablet Take 25 mg by mouth as needed.     . Omega-3 Fatty Acids (FISH OIL PO) Take 1-2 each by mouth daily.     Marland Kitchen omeprazole (PRILOSEC) 20 MG capsule take 1 capsule by mouth once daily 30 capsule 11  . propranolol (INDERAL) 10 MG tablet take 1 tablet by mouth twice a day 60 tablet 5  . psyllium (METAMUCIL) 58.6 % packet Take 1 packet by mouth as needed.    Marland Kitchen azithromycin (ZITHROMAX Z-PAK) 250 MG tablet Take 1 tablet (250 mg total) by mouth daily. Take 2tabs on first day, then 1tab once a day till complete (Patient not taking: Reported on 10/10/2017) 6 tablet 0  . benzonatate (TESSALON) 100 MG capsule Take 1 capsule (100 mg total) by mouth 3 (three) times daily as needed for cough. (Patient not taking: Reported on 10/10/2017) 20 capsule 0  . dextromethorphan-guaiFENesin (MUCINEX DM) 30-600 MG 12hr tablet Take 1 tablet by mouth 2 (two) times daily as needed for cough. (Patient not taking: Reported on 10/10/2017) 14 tablet 0  . fluticasone (FLONASE) 50 MCG/ACT nasal spray Place 2 sprays into both nostrils  daily. 16 g 0  . oxymetazoline (AFRIN NASAL SPRAY) 0.05 % nasal spray Place 1 spray into both nostrils 2 (two) times daily. Use only for 3days, then stop 30 mL 0  . sodium chloride (OCEAN) 0.65 % SOLN nasal spray Place 1 spray into both nostrils as needed for congestion. 15 mL 0   No facility-administered medications prior to visit.     ROS Review of Systems  Constitutional: Positive for fatigue. Negative for activity change, appetite change, chills and unexpected weight change.  HENT: Negative for congestion, mouth sores and sinus pressure.   Eyes: Negative for visual disturbance.  Respiratory: Negative for cough and chest tightness.   Gastrointestinal: Negative for abdominal pain and nausea.  Genitourinary: Negative for difficulty urinating, frequency and vaginal pain.  Musculoskeletal: Positive for arthralgias. Negative for back pain and gait problem.  Skin: Negative for pallor and rash.  Neurological: Negative for dizziness, tremors, weakness, numbness and headaches.  Psychiatric/Behavioral: Negative for confusion, sleep disturbance and suicidal ideas. The patient is nervous/anxious.     Objective:  Pulse 65   Temp 98.1 F (36.7 C) (Oral)   Ht 5\' 4"  (1.626 m)   Wt 187 lb (84.8 kg)   SpO2 98%   BMI 32.10 kg/m  BP Readings from Last 3 Encounters:  10/10/17 126/83  11/18/16 139/85  02/12/16 138/80    Wt Readings from Last 3 Encounters:  10/14/17 187 lb (84.8 kg)  10/10/17 185 lb (83.9 kg)  05/25/17 188 lb (85.3 kg)    Physical Exam  Constitutional: She appears well-developed. No distress.  HENT:  Head: Normocephalic.  Right Ear: External ear normal.  Left Ear: External ear normal.  Nose: Nose normal.  Mouth/Throat: Oropharynx is clear and moist.  Eyes: Conjunctivae are normal. Pupils are equal, round, and reactive to light. Right eye exhibits no discharge. Left eye exhibits no discharge.  Neck: Normal range of motion. Neck supple. No JVD present. No tracheal  deviation present. No thyromegaly present.  Cardiovascular: Normal rate, regular rhythm and normal heart sounds.  Pulmonary/Chest: No stridor. No respiratory distress. She has no wheezes.  Abdominal: Soft. Bowel sounds are normal. She exhibits no distension and no mass. There is no tenderness. There is no rebound and no guarding.  Musculoskeletal: She exhibits no edema or tenderness.  Lymphadenopathy:    She has no cervical adenopathy.  Neurological: She displays normal reflexes. No cranial nerve deficit. She exhibits normal muscle tone. Coordination normal.  Skin: No rash noted. No erythema.  Psychiatric: Her behavior is normal. Judgment and thought content normal.    Lab Results  Component Value Date   WBC 4.2 05/21/2017   HGB 16.0 (H) 10/10/2017   HCT 47.0 (H) 10/10/2017   PLT 217.0 05/21/2017   GLUCOSE 94 10/10/2017   CHOL 242 (H) 05/21/2017   TRIG 81.0 05/21/2017   HDL 93.60 05/21/2017   LDLDIRECT 172.9 04/03/2013   LDLCALC 132 (H) 05/21/2017   ALT 27 05/21/2017   AST 27 05/21/2017   NA 140 10/10/2017   K 3.7 10/10/2017   CL 103 10/10/2017   CREATININE 0.70 10/10/2017   BUN 10 10/10/2017   CO2 27 05/21/2017   TSH 2.97 05/21/2017   MICROALBUR <0.7 05/21/2017    No results found.  Assessment & Plan:   There are no diagnoses linked to this encounter. I have discontinued Laura Vasquez's dextromethorphan-guaiFENesin, sodium chloride, oxymetazoline, benzonatate, fluticasone, and azithromycin. I am also having her maintain her Omega-3 Fatty Acids (FISH OIL PO), cholecalciferol, MAGNESIUM PO, calcium carbonate, aspirin EC, meclizine, cyanocobalamin, diazepam, B Complex Vitamins (B COMPLEX PO), psyllium, propranolol, and omeprazole.  No orders of the defined types were placed in this encounter.    Follow-up: No Follow-up on file.  Walker Kehr, MD

## 2017-10-14 NOTE — Assessment & Plan Note (Addendum)
Chronic  Diazepam prn - rare  Potential benefits of a long term benzodiazepines  use as well as potential risks  and complications were explained to the patient and were aknowledged. Pt declined antidepressants

## 2017-10-14 NOTE — Assessment & Plan Note (Signed)
On B12 

## 2017-10-15 ENCOUNTER — Other Ambulatory Visit: Payer: Self-pay | Admitting: Internal Medicine

## 2017-11-23 ENCOUNTER — Other Ambulatory Visit (INDEPENDENT_AMBULATORY_CARE_PROVIDER_SITE_OTHER): Payer: Medicare Other

## 2017-11-23 ENCOUNTER — Encounter: Payer: Self-pay | Admitting: Internal Medicine

## 2017-11-23 ENCOUNTER — Ambulatory Visit (INDEPENDENT_AMBULATORY_CARE_PROVIDER_SITE_OTHER): Payer: Medicare Other | Admitting: Internal Medicine

## 2017-11-23 ENCOUNTER — Telehealth: Payer: Self-pay | Admitting: Internal Medicine

## 2017-11-23 VITALS — BP 142/92 | HR 72 | Temp 98.4°F | Ht 64.0 in | Wt 187.0 lb

## 2017-11-23 DIAGNOSIS — I10 Essential (primary) hypertension: Secondary | ICD-10-CM | POA: Diagnosis not present

## 2017-11-23 DIAGNOSIS — F411 Generalized anxiety disorder: Secondary | ICD-10-CM | POA: Diagnosis not present

## 2017-11-23 DIAGNOSIS — E559 Vitamin D deficiency, unspecified: Secondary | ICD-10-CM

## 2017-11-23 DIAGNOSIS — R002 Palpitations: Secondary | ICD-10-CM

## 2017-11-23 DIAGNOSIS — E538 Deficiency of other specified B group vitamins: Secondary | ICD-10-CM | POA: Diagnosis not present

## 2017-11-23 DIAGNOSIS — I519 Heart disease, unspecified: Secondary | ICD-10-CM

## 2017-11-23 DIAGNOSIS — J301 Allergic rhinitis due to pollen: Secondary | ICD-10-CM | POA: Diagnosis not present

## 2017-11-23 LAB — CBC WITH DIFFERENTIAL/PLATELET
BASOS ABS: 0 10*3/uL (ref 0.0–0.1)
Basophils Relative: 0.4 % (ref 0.0–3.0)
Eosinophils Absolute: 0.1 10*3/uL (ref 0.0–0.7)
Eosinophils Relative: 1.4 % (ref 0.0–5.0)
HEMATOCRIT: 44.1 % (ref 36.0–46.0)
Hemoglobin: 14.9 g/dL (ref 12.0–15.0)
LYMPHS ABS: 1.5 10*3/uL (ref 0.7–4.0)
LYMPHS PCT: 29.8 % (ref 12.0–46.0)
MCHC: 33.7 g/dL (ref 30.0–36.0)
MCV: 91 fl (ref 78.0–100.0)
Monocytes Absolute: 0.5 10*3/uL (ref 0.1–1.0)
Monocytes Relative: 8.9 % (ref 3.0–12.0)
NEUTROS PCT: 59.5 % (ref 43.0–77.0)
Neutro Abs: 3.1 10*3/uL (ref 1.4–7.7)
Platelets: 236 10*3/uL (ref 150.0–400.0)
RBC: 4.84 Mil/uL (ref 3.87–5.11)
RDW: 13.8 % (ref 11.5–15.5)
WBC: 5.2 10*3/uL (ref 4.0–10.5)

## 2017-11-23 LAB — BASIC METABOLIC PANEL
BUN: 13 mg/dL (ref 6–23)
CHLORIDE: 102 meq/L (ref 96–112)
CO2: 28 meq/L (ref 19–32)
CREATININE: 0.81 mg/dL (ref 0.40–1.20)
Calcium: 9.9 mg/dL (ref 8.4–10.5)
GFR: 73.5 mL/min (ref >60.00)
Glucose, Bld: 102 mg/dL — ABNORMAL HIGH (ref 70–99)
POTASSIUM: 5.1 meq/L (ref 3.5–5.1)
Sodium: 139 mEq/L (ref 135–145)

## 2017-11-23 LAB — HEPATIC FUNCTION PANEL
ALBUMIN: 4.5 g/dL (ref 3.5–5.2)
ALT: 31 U/L (ref 0–35)
AST: 33 U/L (ref 0–37)
Alkaline Phosphatase: 38 U/L — ABNORMAL LOW (ref 39–117)
Bilirubin, Direct: 0.1 mg/dL (ref 0.0–0.3)
TOTAL PROTEIN: 7.7 g/dL (ref 6.0–8.3)
Total Bilirubin: 1 mg/dL (ref 0.2–1.2)

## 2017-11-23 LAB — MAGNESIUM: MAGNESIUM: 2.1 mg/dL (ref 1.5–2.5)

## 2017-11-23 LAB — VITAMIN D 25 HYDROXY (VIT D DEFICIENCY, FRACTURES): VITD: 45.12 ng/mL (ref 30.00–100.00)

## 2017-11-23 NOTE — Telephone Encounter (Signed)
Pt would llike blood work order put in for her next appointment so she can come in a couple days prior.  Please advise

## 2017-11-23 NOTE — Patient Instructions (Signed)
MC well w/Jill 

## 2017-11-23 NOTE — Assessment & Plan Note (Signed)
Claritin prn 

## 2017-11-23 NOTE — Assessment & Plan Note (Signed)
On Propranolol 

## 2017-11-23 NOTE — Assessment & Plan Note (Addendum)
Nl BP at home - reviewed On Propranolol

## 2017-11-23 NOTE — Assessment & Plan Note (Signed)
Diazepam prn  Potential benefits of a long term benzodiazepines  use as well as potential risks  and complications were explained to the patient and were aknowledged. 

## 2017-11-23 NOTE — Progress Notes (Signed)
Subjective:  Patient ID: Laura Vasquez, female    DOB: 03-12-1946  Age: 72 y.o. MRN: [id number]  CC: No chief complaint on file.   HPI Axel Meas [facility] presents for palpitations, elevated BP - nl BP at home  Outpatient Medications Prior to Visit  Medication Sig Dispense Refill  . aspirin EC 81 MG tablet Take 81 mg by mouth daily.    . B Complex Vitamins (B COMPLEX PO) Take 1 tablet by mouth daily.    . calcium carbonate (TUMS - DOSED IN MG ELEMENTAL CALCIUM) 500 MG chewable tablet Chew 1 tablet by mouth as needed.     . cholecalciferol (VITAMIN D) 1000 UNITS tablet Take 2,000 Units by mouth daily.     . cyanocobalamin 500 MCG tablet Take 500 mcg by mouth daily.    . diazepam (VALIUM) 5 MG tablet Take 0.5-1 tablets (2.5-5 mg total) by mouth every 12 (twelve) hours as needed for anxiety. 60 tablet 5  . MAGNESIUM PO Take 800 mg by mouth.     . meclizine (ANTIVERT) 25 MG tablet Take 25 mg by mouth as needed.     . Omega-3 Fatty Acids (FISH OIL PO) Take 1-2 each by mouth daily.     Marland Kitchen omeprazole (PRILOSEC) 20 MG capsule take 1 capsule by mouth once daily 30 capsule 11  . propranolol (INDERAL) 10 MG tablet take 1 tablet by mouth twice a day 60 tablet 5  . psyllium (METAMUCIL) 58.6 % packet Take 1 packet by mouth as needed.     No facility-administered medications prior to visit.     ROS Review of Systems  Constitutional: Negative for activity change, appetite change, chills, fatigue and unexpected weight change.  HENT: Negative for congestion, mouth sores and sinus pressure.   Eyes: Negative for visual disturbance.  Respiratory: Negative for cough and chest tightness.   Gastrointestinal: Negative for abdominal pain and nausea.  Genitourinary: Negative for difficulty urinating, frequency and vaginal pain.  Musculoskeletal: Negative for back pain and gait problem.  Skin: Negative for pallor and rash.  Neurological: Negative for dizziness, tremors, weakness, numbness and headaches.    Psychiatric/Behavioral: Negative for confusion and sleep disturbance.    Objective:  BP (!) 142/92 (BP Location: Left Arm, Patient Position: Sitting, Cuff Size: Large)   Pulse 72   Temp 98.4 F (36.9 C) (Oral)   Ht 5\' 4"  (1.626 m)   Wt 187 lb (84.8 kg)   SpO2 98%   BMI 32.10 kg/m   BP Readings from Last 3 Encounters:  11/23/17 (!) 142/92  10/10/17 126/83  11/18/16 139/85    Wt Readings from Last 3 Encounters:  11/23/17 187 lb (84.8 kg)  10/14/17 187 lb (84.8 kg)  10/10/17 185 lb (83.9 kg)    Physical Exam  Constitutional: She appears well-developed. No distress.  HENT:  Head: Normocephalic.  Right Ear: External ear normal.  Left Ear: External ear normal.  Nose: Nose normal.  Mouth/Throat: Oropharynx is clear and moist.  Eyes: Pupils are equal, round, and reactive to light. Conjunctivae are normal. Right eye exhibits no discharge. Left eye exhibits no discharge.  Neck: Normal range of motion. Neck supple. No JVD present. No tracheal deviation present. No thyromegaly present.  Cardiovascular: Normal rate, regular rhythm and normal heart sounds.  Pulmonary/Chest: No stridor. No respiratory distress. She has no wheezes.  Abdominal: Soft. Bowel sounds are normal. She exhibits no distension and no mass. There is no tenderness. There is no rebound and no guarding.  Musculoskeletal: She exhibits no edema or tenderness.  Lymphadenopathy:    She has no cervical adenopathy.  Neurological: She displays normal reflexes. No cranial nerve deficit. She exhibits normal muscle tone. Coordination normal.  Skin: No rash noted. No erythema.  Psychiatric: She has a normal mood and affect. Her behavior is normal. Judgment and thought content normal.    Lab Results  Component Value Date   WBC 4.2 05/21/2017   HGB 16.0 (H) 10/10/2017   HCT 47.0 (H) 10/10/2017   PLT 217.0 05/21/2017   GLUCOSE 94 10/10/2017   CHOL 242 (H) 05/21/2017   TRIG 81.0 05/21/2017   HDL 93.60 05/21/2017    LDLDIRECT 172.9 04/03/2013   LDLCALC 132 (H) 05/21/2017   ALT 27 05/21/2017   AST 27 05/21/2017   NA 140 10/10/2017   K 3.7 10/10/2017   CL 103 10/10/2017   CREATININE 0.70 10/10/2017   BUN 10 10/10/2017   CO2 27 05/21/2017   TSH 2.97 05/21/2017   MICROALBUR <0.7 05/21/2017    No results found.  Assessment & Plan:   There are no diagnoses linked to this encounter. I am having Laura Vasquez maintain her Omega-3 Fatty Acids (FISH OIL PO), cholecalciferol, MAGNESIUM PO, calcium carbonate, aspirin EC, meclizine, cyanocobalamin, B Complex Vitamins (B COMPLEX PO), psyllium, omeprazole, diazepam, and propranolol.  No orders of the defined types were placed in this encounter.    Follow-up: No follow-ups on file.  Walker Kehr, MD

## 2017-11-23 NOTE — Assessment & Plan Note (Signed)
On B12 

## 2018-01-24 ENCOUNTER — Telehealth: Payer: Self-pay | Admitting: Internal Medicine

## 2018-01-24 NOTE — Telephone Encounter (Signed)
Patient states she need to speak with you.  She did not disclose what it was in regard to.  Patient was unhappy about reaching the call center today after a 20 minute hold.  Patient states she will come up to the office 6/11 to wait to see Laura Vasquez.

## 2018-02-03 ENCOUNTER — Other Ambulatory Visit: Payer: Self-pay | Admitting: Internal Medicine

## 2018-02-03 DIAGNOSIS — E785 Hyperlipidemia, unspecified: Secondary | ICD-10-CM

## 2018-02-03 DIAGNOSIS — R002 Palpitations: Secondary | ICD-10-CM

## 2018-02-03 DIAGNOSIS — I10 Essential (primary) hypertension: Secondary | ICD-10-CM

## 2018-05-13 ENCOUNTER — Other Ambulatory Visit: Payer: Self-pay | Admitting: Internal Medicine

## 2018-05-23 ENCOUNTER — Other Ambulatory Visit (INDEPENDENT_AMBULATORY_CARE_PROVIDER_SITE_OTHER): Payer: Medicare Other

## 2018-05-23 ENCOUNTER — Other Ambulatory Visit: Payer: Medicare Other

## 2018-05-23 DIAGNOSIS — R002 Palpitations: Secondary | ICD-10-CM

## 2018-05-23 DIAGNOSIS — E785 Hyperlipidemia, unspecified: Secondary | ICD-10-CM | POA: Diagnosis not present

## 2018-05-23 DIAGNOSIS — I10 Essential (primary) hypertension: Secondary | ICD-10-CM

## 2018-05-23 LAB — BASIC METABOLIC PANEL
BUN: 12 mg/dL (ref 6–23)
CHLORIDE: 104 meq/L (ref 96–112)
CO2: 27 meq/L (ref 19–32)
CREATININE: 0.77 mg/dL (ref 0.40–1.20)
Calcium: 9.7 mg/dL (ref 8.4–10.5)
GFR: 77.82 mL/min (ref >60.00)
Glucose, Bld: 99 mg/dL (ref 70–99)
POTASSIUM: 4.3 meq/L (ref 3.5–5.1)
Sodium: 139 mEq/L (ref 135–145)

## 2018-05-23 LAB — URINALYSIS, ROUTINE W REFLEX MICROSCOPIC
Bilirubin Urine: NEGATIVE
Hgb urine dipstick: NEGATIVE
KETONES UR: NEGATIVE
Nitrite: NEGATIVE
PH: 7 (ref 5.0–8.0)
RBC / HPF: NONE SEEN (ref 0–?)
SPECIFIC GRAVITY, URINE: 1.01 (ref 1.000–1.030)
TOTAL PROTEIN, URINE-UPE24: NEGATIVE
URINE GLUCOSE: NEGATIVE
Urobilinogen, UA: 0.2 (ref 0.0–1.0)

## 2018-05-23 LAB — CBC WITH DIFFERENTIAL/PLATELET
BASOS ABS: 0 10*3/uL (ref 0.0–0.1)
Basophils Relative: 0.9 % (ref 0.0–3.0)
EOS ABS: 0.1 10*3/uL (ref 0.0–0.7)
Eosinophils Relative: 2 % (ref 0.0–5.0)
HEMATOCRIT: 45.4 % (ref 36.0–46.0)
Hemoglobin: 15.1 g/dL — ABNORMAL HIGH (ref 12.0–15.0)
LYMPHS PCT: 29 % (ref 12.0–46.0)
Lymphs Abs: 1.3 10*3/uL (ref 0.7–4.0)
MCHC: 33.2 g/dL (ref 30.0–36.0)
MCV: 92.1 fl (ref 78.0–100.0)
MONO ABS: 0.4 10*3/uL (ref 0.1–1.0)
Monocytes Relative: 8 % (ref 3.0–12.0)
NEUTROS ABS: 2.7 10*3/uL (ref 1.4–7.7)
NEUTROS PCT: 60.1 % (ref 43.0–77.0)
Platelets: 234 10*3/uL (ref 150.0–400.0)
RBC: 4.93 Mil/uL (ref 3.87–5.11)
RDW: 14.1 % (ref 11.5–15.5)
WBC: 4.6 10*3/uL (ref 4.0–10.5)

## 2018-05-23 LAB — HEPATIC FUNCTION PANEL
ALK PHOS: 34 U/L — AB (ref 39–117)
ALT: 34 U/L (ref 0–35)
AST: 34 U/L (ref 0–37)
Albumin: 4.4 g/dL (ref 3.5–5.2)
BILIRUBIN DIRECT: 0.1 mg/dL (ref 0.0–0.3)
Total Bilirubin: 0.9 mg/dL (ref 0.2–1.2)
Total Protein: 7.4 g/dL (ref 6.0–8.3)

## 2018-05-23 LAB — LIPID PANEL
CHOL/HDL RATIO: 3
Cholesterol: 240 mg/dL — ABNORMAL HIGH (ref 0–200)
HDL: 82 mg/dL (ref >39.00)
LDL CALC: 135 mg/dL — AB (ref 0–99)
NonHDL: 157.51
Triglycerides: 114 mg/dL (ref 0.0–149.0)
VLDL: 22.8 mg/dL (ref 0.0–40.0)

## 2018-05-23 LAB — TSH: TSH: 3.35 u[IU]/mL (ref 0.35–4.50)

## 2018-05-25 ENCOUNTER — Ambulatory Visit: Payer: Medicare Other | Admitting: Internal Medicine

## 2018-05-26 ENCOUNTER — Ambulatory Visit (INDEPENDENT_AMBULATORY_CARE_PROVIDER_SITE_OTHER): Payer: Medicare Other | Admitting: Internal Medicine

## 2018-05-26 ENCOUNTER — Encounter: Payer: Self-pay | Admitting: Internal Medicine

## 2018-05-26 ENCOUNTER — Other Ambulatory Visit: Payer: Self-pay | Admitting: Internal Medicine

## 2018-05-26 VITALS — HR 74 | Temp 98.7°F | Ht 64.0 in | Wt 183.0 lb

## 2018-05-26 DIAGNOSIS — R42 Dizziness and giddiness: Secondary | ICD-10-CM | POA: Diagnosis not present

## 2018-05-26 DIAGNOSIS — N959 Unspecified menopausal and perimenopausal disorder: Secondary | ICD-10-CM | POA: Diagnosis not present

## 2018-05-26 DIAGNOSIS — D582 Other hemoglobinopathies: Secondary | ICD-10-CM

## 2018-05-26 DIAGNOSIS — R002 Palpitations: Secondary | ICD-10-CM

## 2018-05-26 DIAGNOSIS — E538 Deficiency of other specified B group vitamins: Secondary | ICD-10-CM | POA: Diagnosis not present

## 2018-05-26 DIAGNOSIS — Z23 Encounter for immunization: Secondary | ICD-10-CM | POA: Diagnosis not present

## 2018-05-26 DIAGNOSIS — M8580 Other specified disorders of bone density and structure, unspecified site: Secondary | ICD-10-CM

## 2018-05-26 DIAGNOSIS — I10 Essential (primary) hypertension: Secondary | ICD-10-CM | POA: Diagnosis not present

## 2018-05-26 MED ORDER — MECLIZINE HCL 25 MG PO TABS: 25.0000 mg | ORAL_TABLET | Freq: Three times a day (TID) | ORAL | 1 refills | Status: AC | PRN

## 2018-05-26 MED ORDER — OMEPRAZOLE 20 MG PO CPDR: 20.0000 mg | DELAYED_RELEASE_CAPSULE | Freq: Every day | ORAL | 11 refills | Status: DC

## 2018-05-26 NOTE — Assessment & Plan Note (Signed)
BP is nl at home. SBP at home is 100-170, mostly WNL Pt declined additional Rx On Propranolol 

## 2018-05-26 NOTE — Assessment & Plan Note (Signed)
On b12 

## 2018-05-26 NOTE — Assessment & Plan Note (Signed)
Stable sx's 

## 2018-05-26 NOTE — Patient Instructions (Addendum)
BOOKS:  Whitney Post and Alene Mires "Stop walking on eggshells"  Marisue Brooklyn "Sociopath Next Door"  Caleb Popp "Emotional [employer]ires"  Rudene Anda "Too Soon Old, Too Late Smart" - not available in [employer]  ---------------------------------------------------------------------------------------------------   [facility] well w/Jill  Cardiac CT calcium scoring test $150   Computed tomography, more commonly known as a CT or CAT scan, is a diagnostic medical imaging test. Like traditional x-rays, it produces multiple images or pictures of the inside of the body. The cross-sectional images generated during a CT scan can be reformatted in multiple planes. They can even generate three-dimensional images. These images can be viewed on a computer monitor, printed on film or by a 3D printer, or transferred to a CD or DVD. CT images of internal organs, bones, soft tissue and blood vessels provide greater detail than traditional x-rays, particularly of soft tissues and blood vessels. A cardiac CT scan for coronary calcium is a non-invasive way of obtaining information about the presence, location and extent of calcified plaque in the coronary arteries-the vessels that supply oxygen-containing blood to the heart muscle. Calcified plaque results when there is a build-up of fat and other substances under the inner layer of the artery. This material can calcify which signals the presence of atherosclerosis, a disease of the vessel wall, also called coronary artery disease (CAD). People with this disease have an increased risk for heart attacks. In addition, over time, progression of plaque build up (CAD) can narrow the arteries or even close off blood flow to the heart. The result may be chest pain, sometimes called "angina," or a heart attack. Because calcium is a marker of CAD, the amount of calcium detected on a cardiac CT scan is a helpful prognostic tool. The findings on cardiac CT are expressed as a  calcium score. Another name for this test is coronary artery calcium scoring.  What are some common uses of the procedure? The goal of cardiac CT scan for calcium scoring is to determine if CAD is present and to what extent, even if there are no symptoms. It is a screening study that may be recommended by a physician for patients with risk factors for CAD but no clinical symptoms. The major risk factors for CAD are: . high blood cholesterol levels  . family history of heart attacks  . diabetes  . high blood pressure  . cigarette smoking  . overweight or obese  . physical inactivity   A negative cardiac CT scan for calcium scoring shows no calcification within the coronary arteries. This suggests that CAD is absent or so minimal it cannot be seen by this technique. The chance of having a heart attack over the next two to five years is very low under these circumstances. A positive test means that CAD is present, regardless of whether or not the patient is experiencing any symptoms. The amount of calcification-expressed as the calcium score-may help to predict the likelihood of a myocardial infarction (heart attack) in the coming years and helps your medical doctor or cardiologist decide whether the patient may need to take preventive medicine or undertake other measures such as diet and exercise to lower the risk for heart attack. The extent of CAD is graded according to your calcium score:  Calcium Score  Presence of CAD  0 No evidence of CAD   1-10 Minimal evidence of CAD  11-100 Mild evidence of CAD  101-400 Moderate evidence of CAD  Over 400 Extensive evidence of CAD

## 2018-05-26 NOTE — Progress Notes (Signed)
Subjective:  Patient ID: Laura Vasquez, female    DOB: 1945-09-29  Age: 72 y.o. MRN: [id number]  CC: No chief complaint on file.   HPI Laura Vasquez [facility] presents for HTN, GERD, anxiety and palpitations Nl BP at home  Outpatient Medications Prior to Visit  Medication Sig Dispense Refill  . aspirin EC 81 MG tablet Take 81 mg by mouth daily.    . B Complex Vitamins (B COMPLEX PO) Take 1 tablet by mouth daily.    . calcium carbonate (TUMS - DOSED IN MG ELEMENTAL CALCIUM) 500 MG chewable tablet Chew 1 tablet by mouth as needed.     . cholecalciferol (VITAMIN D) 1000 UNITS tablet Take 2,000 Units by mouth daily.     . cyanocobalamin 500 MCG tablet Take 500 mcg by mouth daily.    . diazepam (VALIUM) 5 MG tablet Take 0.5-1 tablets (2.5-5 mg total) by mouth every 12 (twelve) hours as needed for anxiety. 60 tablet 5  . MAGNESIUM PO Take 800 mg by mouth.     . meclizine (ANTIVERT) 25 MG tablet Take 25 mg by mouth as needed.     . Omega-3 Fatty Acids (FISH OIL PO) Take 1-2 each by mouth daily.     Marland Kitchen omeprazole (PRILOSEC) 20 MG capsule TAKE 1 CAPSULE BY MOUTH ONCE DAILY 30 capsule 0  . propranolol (INDERAL) 10 MG tablet take 1 tablet by mouth twice a day 60 tablet 5  . psyllium (METAMUCIL) 58.6 % packet Take 1 packet by mouth as needed.     No facility-administered medications prior to visit.     ROS: Review of Systems  Constitutional: Positive for fatigue. Negative for activity change, appetite change, chills and unexpected weight change.  HENT: Negative for congestion, mouth sores and sinus pressure.   Eyes: Negative for visual disturbance.  Respiratory: Negative for cough and chest tightness.   Cardiovascular: Positive for palpitations.  Gastrointestinal: Negative for abdominal pain and nausea.  Genitourinary: Negative for difficulty urinating, frequency and vaginal pain.  Musculoskeletal: Negative for back pain and gait problem.  Skin: Negative for pallor and rash.  Neurological: Positive  for dizziness. Negative for tremors, weakness, numbness and headaches.  Psychiatric/Behavioral: Negative for confusion and sleep disturbance.    Objective:  Pulse 74   Temp 98.7 F (37.1 C) (Oral)   Ht 5\' 4"  (1.626 m)   Wt 183 lb (83 kg)   SpO2 96%   BMI 31.41 kg/m   BP Readings from Last 3 Encounters:  11/23/17 (!) 142/92  10/10/17 126/83  11/18/16 139/85    Wt Readings from Last 3 Encounters:  05/26/18 183 lb (83 kg)  11/23/17 187 lb (84.8 kg)  10/14/17 187 lb (84.8 kg)    Physical Exam  Constitutional: She appears well-developed. No distress.  HENT:  Head: Normocephalic.  Right Ear: External ear normal.  Left Ear: External ear normal.  Nose: Nose normal.  Mouth/Throat: Oropharynx is clear and moist.  Eyes: Pupils are equal, round, and reactive to light. Conjunctivae are normal. Right eye exhibits no discharge. Left eye exhibits no discharge.  Neck: Normal range of motion. Neck supple. No JVD present. No tracheal deviation present. No thyromegaly present.  Cardiovascular: Normal rate, regular rhythm and normal heart sounds.  Pulmonary/Chest: No stridor. No respiratory distress. She has no wheezes.  Abdominal: Soft. Bowel sounds are normal. She exhibits no distension and no mass. There is no tenderness. There is no rebound and no guarding.  Musculoskeletal: She exhibits no edema or  tenderness.  Lymphadenopathy:    She has no cervical adenopathy.  Neurological: She displays normal reflexes. No cranial nerve deficit. She exhibits normal muscle tone. Coordination normal.  Skin: No rash noted. No erythema.  Psychiatric: She has a normal mood and affect. Her behavior is normal. Judgment and thought content normal.    Lab Results  Component Value Date   WBC 4.6 05/23/2018   HGB 15.1 (H) 05/23/2018   HCT 45.4 05/23/2018   PLT 234.0 05/23/2018   GLUCOSE 99 05/23/2018   CHOL 240 (H) 05/23/2018   TRIG 114.0 05/23/2018   HDL 82.00 05/23/2018   LDLDIRECT 172.9 04/03/2013    LDLCALC 135 (H) 05/23/2018   ALT 34 05/23/2018   AST 34 05/23/2018   NA 139 05/23/2018   K 4.3 05/23/2018   CL 104 05/23/2018   CREATININE 0.77 05/23/2018   BUN 12 05/23/2018   CO2 27 05/23/2018   TSH 3.35 05/23/2018   MICROALBUR <0.7 05/21/2017    No results found.  Assessment & Plan:   There are no diagnoses linked to this encounter.   No orders of the defined types were placed in this encounter.    Follow-up: No follow-ups on file.  Walker Kehr, MD

## 2018-05-26 NOTE — Assessment & Plan Note (Signed)
B-D exercises Meclizine prn

## 2018-05-26 NOTE — Assessment & Plan Note (Signed)
Hem ref and phlebotomies offered declined. Baby asa qd option was discussed

## 2018-06-11 ENCOUNTER — Other Ambulatory Visit: Payer: Self-pay | Admitting: Internal Medicine

## 2018-07-27 ENCOUNTER — Other Ambulatory Visit: Payer: Self-pay | Admitting: Obstetrics and Gynecology

## 2018-07-27 ENCOUNTER — Ambulatory Visit
Admission: RE | Admit: 2018-07-27 | Discharge: 2018-07-27 | Disposition: A | Discharge status: Home / Self Care | Payer: Medicare Other | Source: Ambulatory Visit | Attending: Obstetrics and Gynecology | Admitting: Obstetrics and Gynecology

## 2018-07-27 DIAGNOSIS — Z1231 Encounter for screening mammogram for malignant neoplasm of breast: Secondary | ICD-10-CM

## 2018-10-19 ENCOUNTER — Telehealth: Payer: Self-pay | Admitting: Emergency Medicine

## 2018-10-19 ENCOUNTER — Encounter: Payer: Self-pay | Admitting: Internal Medicine

## 2018-10-19 ENCOUNTER — Ambulatory Visit (INDEPENDENT_AMBULATORY_CARE_PROVIDER_SITE_OTHER): Payer: Medicare Other | Admitting: Internal Medicine

## 2018-10-19 VITALS — BP 150/94 | HR 98 | Temp 98.5°F | Resp 16 | Ht 64.0 in | Wt 186.8 lb

## 2018-10-19 DIAGNOSIS — J0120 Acute ethmoidal sinusitis, unspecified: Secondary | ICD-10-CM | POA: Diagnosis not present

## 2018-10-19 MED ORDER — AZITHROMYCIN 250 MG PO TABS: ORAL_TABLET | ORAL | 0 refills | Status: DC

## 2018-10-19 NOTE — Patient Instructions (Signed)
Take the antibiotic as prescribed - complete the entire course.    Continue over the counter cold medication, advil and tylenol.  Increase your fluids and rest.    Call if no improvement      Sinusitis, Adult Sinusitis is inflammation of your sinuses. Sinuses are hollow spaces in the bones around your face. Your sinuses are located:  Around your eyes.  In the middle of your forehead.  Behind your nose.  In your cheekbones. Mucus normally drains out of your sinuses. When your nasal tissues become inflamed or swollen, mucus can become trapped or blocked. This allows bacteria, viruses, and fungi to grow, which leads to infection. Most infections of the sinuses are caused by a virus. Sinusitis can develop quickly. It can last for up to 4 weeks (acute) or for more than 12 weeks (chronic). Sinusitis often develops after a cold. What are the causes? This condition is caused by anything that creates swelling in the sinuses or stops mucus from draining. This includes:  Allergies.  Asthma.  Infection from bacteria or viruses.  Deformities or blockages in your nose or sinuses.  Abnormal growths in the nose (nasal polyps).  Pollutants, such as chemicals or irritants in the air.  Infection from fungi (rare). What increases the risk? You are more likely to develop this condition if you:  Have a weak body defense system (immune system).  Do a lot of swimming or diving.  Overuse nasal sprays.  Smoke. What are the signs or symptoms? The main symptoms of this condition are pain and a feeling of pressure around the affected sinuses. Other symptoms include:  Stuffy nose or congestion.  Thick drainage from your nose.  Swelling and warmth over the affected sinuses.  Headache.  Upper toothache.  A cough that may get worse at night.  Extra mucus that collects in the throat or the back of the nose (postnasal drip).  Decreased sense of smell and taste.  Fatigue.  A  fever.  Sore throat.  Bad breath. How is this diagnosed? This condition is diagnosed based on:  Your symptoms.  Your medical history.  A physical exam.  Tests to find out if your condition is acute or chronic. This may include: ? Checking your nose for nasal polyps. ? Viewing your sinuses using a device that has a light (endoscope). ? Testing for allergies or bacteria. ? Imaging tests, such as an MRI or CT scan. In rare cases, a bone biopsy may be done to rule out more serious types of fungal sinus disease. How is this treated? Treatment for sinusitis depends on the cause and whether your condition is chronic or acute.  If caused by a virus, your symptoms should go away on their own within 10 days. You may be given medicines to relieve symptoms. They include: ? Medicines that shrink swollen nasal passages (topical intranasal decongestants). ? Medicines that treat allergies (antihistamines). ? A spray that eases inflammation of the nostrils (topical intranasal corticosteroids). ? Rinses that help get rid of thick mucus in your nose (nasal saline washes).  If caused by bacteria, your health care provider may recommend waiting to see if your symptoms improve. Most bacterial infections will get better without antibiotic medicine. You may be given antibiotics if you have: ? A severe infection. ? A weak immune system.  If caused by narrow nasal passages or nasal polyps, you may need to have surgery. Follow these instructions at home: Medicines  Take, use, or apply over-the-counter and prescription medicines   only as told by your health care provider. These may include nasal sprays.  If you were prescribed an antibiotic medicine, take it as told by your health care provider. Do not stop taking the antibiotic even if you start to feel better. Hydrate and humidify   Drink enough fluid to keep your urine pale yellow. Staying hydrated will help to thin your mucus.  Use a cool mist  humidifier to keep the humidity level in your home above 50%.  Inhale steam for 10-15 minutes, 3-4 times a day, or as told by your health care provider. You can do this in the bathroom while a hot shower is running.  Limit your exposure to cool or dry air. Rest  Rest as much as possible.  Sleep with your head raised (elevated).  Make sure you get enough sleep each night. General instructions   Apply a warm, moist washcloth to your face 3-4 times a day or as told by your health care provider. This will help with discomfort.  Wash your hands often with soap and water to reduce your exposure to germs. If soap and water are not available, use hand sanitizer.  Do not smoke. Avoid being around people who are smoking (secondhand smoke).  Keep all follow-up visits as told by your health care provider. This is important. Contact a health care provider if:  You have a fever.  Your symptoms get worse.  Your symptoms do not improve within 10 days. Get help right away if:  You have a severe headache.  You have persistent vomiting.  You have severe pain or swelling around your face or eyes.  You have vision problems.  You develop confusion.  Your neck is stiff.  You have trouble breathing. Summary  Sinusitis is soreness and inflammation of your sinuses. Sinuses are hollow spaces in the bones around your face.  This condition is caused by nasal tissues that become inflamed or swollen. The swelling traps or blocks the flow of mucus. This allows bacteria, viruses, and fungi to grow, which leads to infection.  If you were prescribed an antibiotic medicine, take it as told by your health care provider. Do not stop taking the antibiotic even if you start to feel better.  Keep all follow-up visits as told by your health care provider. This is important. This information is not intended to replace advice given to you by your health care provider. Make sure you discuss any questions you  have with your health care provider. Document Released: 08/03/2005 Document Revised: 01/03/2018 Document Reviewed: 01/03/2018 Elsevier Interactive Patient Education  2019 Elsevier Inc.   

## 2018-10-19 NOTE — Assessment & Plan Note (Signed)
Likely bacterial  Start zpak otc cold medications Rest, fluid Call if no improvement  

## 2018-10-19 NOTE — Progress Notes (Signed)
Subjective:    Patient ID: Laura Vasquez, female    DOB: 10-27-1945, 73 y.o.   MRN: [id number]  HPI She is here for an acute visit for cold symptoms.  Her symptoms started about one week  She is experiencing watery eyes, teeth pain, sinus pain, PND, b/l earache, ST, nasal congestion and cough.  Her cough is worse first thing in the morning.  She denies SOB, wheeze and lightheadedness.    She has been using her netti pot.   She uses flonase.    Medications and allergies reviewed with patient and updated if appropriate.  Patient Active Problem List   Diagnosis Date Noted  . Difficulty walking 11/21/2015  . Degenerative arthritis of right knee 10/04/2015  . Vulvitis 08/14/2015  . Patellar contusion 06/11/2015  . Snoring 11/08/2013  . Elevated hemoglobin ([place]) 10/11/2013  . LLQ abdominal pain 10/10/2013  . Well adult exam 03/25/2012  . Cough 02/03/2012  . Acute sinus infection 01/23/2012  . Vertigo 01/08/2012  . Diverticulosis of large intestine 06/24/2011  . Special screening for malignant neoplasms, colon 06/24/2011  . DIASTOLIC DYSFUNCTION 58/85/0277  . Palpitations 12/16/2009  . Allergic rhinitis 11/21/2009  . Generalized anxiety disorder 04/29/2009  . Hemoptysis 04/26/2009  . FATIGUE 09/04/2008  . GRIEF REACTION 06/01/2008  . PHARYNGITIS 01/24/2008  . ARM PAIN, LEFT 01/24/2008  . CERVICAL STRAIN 01/24/2008  . Essential hypertension 11/14/2007  . ELEVATED BP 11/14/2007  . POLYP, COLON 11/04/2007  . Obesity 11/04/2007  . EROSIVE ESOPHAGITIS 11/04/2007  . GERD 11/04/2007  . PEPTIC STRICTURE 11/04/2007  . UPPER RESPIRATORY INFECTION (URI) 07/12/2007  . LOW BACK PAIN 07/12/2007  . SWEATING 07/12/2007  . Tachycardia 07/12/2007  . COLONIC POLYPS, HX OF 07/12/2007  . B12 deficiency 05/26/2007  . HYPERLIPIDEMIA 05/26/2007  . MITRAL VALVE PROLAPSE 05/26/2007  . Irritable bowel syndrome 05/26/2007  . MENOPAUSAL SYNDROME 05/26/2007  . Fibromyalgia 05/26/2007     Current Outpatient Medications on File Prior to Visit  Medication Sig Dispense Refill  . aspirin EC 81 MG tablet Take 81 mg by mouth daily.    . B Complex Vitamins (B COMPLEX PO) Take 1 tablet by mouth daily.    . calcium carbonate (TUMS - DOSED IN MG ELEMENTAL CALCIUM) 500 MG chewable tablet Chew 1 tablet by mouth as needed.     . cholecalciferol (VITAMIN D) 1000 UNITS tablet Take 2,000 Units by mouth daily.     . cyanocobalamin 500 MCG tablet Take 500 mcg by mouth daily.    . diazepam (VALIUM) 5 MG tablet Take 0.5-1 tablets (2.5-5 mg total) by mouth every 12 (twelve) hours as needed for anxiety. 60 tablet 5  . MAGNESIUM PO Take 800 mg by mouth.     . meclizine (ANTIVERT) 25 MG tablet Take 1 tablet (25 mg total) by mouth 3 (three) times daily as needed for dizziness. 30 tablet 1  . Omega-3 Fatty Acids (FISH OIL PO) Take 1-2 each by mouth daily.     Marland Kitchen omeprazole (PRILOSEC) 20 MG capsule Take 1 capsule (20 mg total) by mouth daily. 30 capsule 11  . omeprazole (PRILOSEC) 20 MG capsule TAKE 1 CAPSULE BY MOUTH ONCE DAILY 30 capsule 11  . propranolol (INDERAL) 10 MG tablet take 1 tablet by mouth twice a day 60 tablet 5  . psyllium (METAMUCIL) 58.6 % packet Take 1 packet by mouth as needed.     No current facility-administered medications on file prior to visit.     Past Medical  History:  Diagnosis Date  . Allergy   . Anxiety   . Arrhythmia   . Diverticulosis   . Erosive esophagitis   . Fatty liver   . Fibromyalgia   . GERD (gastroesophageal reflux disease)   . Hiatal hernia   . Hyperlipidemia   . Hyperplastic colon polyp   . Hypertension   . IBS (irritable bowel syndrome)   . Mitral regurgitation   . Osteoarthritis   . Palpitations   . Peptic stricture of esophagus   . Vitamin B12 deficiency     Past Surgical History:  Procedure Laterality Date  . ABDOMINAL HYSTERECTOMY    . APPENDECTOMY    . CHOLECYSTECTOMY    . RECTOCELE REPAIR    . TUBAL LIGATION      Social  History   Socioeconomic History  . Marital status: Divorced    Spouse name: Not on file  . Number of children: 2  . Years of education: Not on file  . Highest education level: Not on file  Occupational History  . Occupation: Retired     Fish farm manager: RETIRED  Social Needs  . Financial resource strain: Not on file  . Food insecurity:    Worry: Not on file    Inability: Not on file  . Transportation needs:    Medical: Not on file    Non-medical: Not on file  Tobacco Use  . Smoking status: Former Smoker    Last attempt to quit: 08/18/1991    Years since quitting: 27.1  . Smokeless tobacco: Never Used  Substance and Sexual Activity  . Alcohol use: Yes    Alcohol/week: 0.0 standard drinks    Comment: 1-2 glasses   . Drug use: No  . Sexual activity: Not Currently  Lifestyle  . Physical activity:    Days per week: Not on file    Minutes per session: Not on file  . Stress: Not on file  Relationships  . Social connections:    Talks on phone: Not on file    Gets together: Not on file    Attends religious service: Not on file    Active member of club or organization: Not on file    Attends meetings of clubs or organizations: Not on file    Relationship status: Not on file  Other Topics Concern  . Not on file  Social History Narrative   1-2 caffeine drinks daily    Family History  Problem Relation Age of Onset  . Hypertension Mother   . Ovarian cancer Mother   . Hypertension Father   . Kidney disease Sister        ESRD  . Fibromyalgia Sister        x 2  . Diabetes Sister   . Breast cancer Maternal Grandmother   . Colon cancer Neg Hx     Review of Systems  Constitutional: Negative for chills and fever.  HENT: Positive for congestion, ear pain (b/l ), postnasal drip, sinus pain (teeth pain) and sore throat.   Respiratory: Positive for cough (more in morning - can be productive in am). Negative for shortness of breath and wheezing.   Neurological: Negative for  light-headedness and headaches.       Objective:   Vitals:   10/19/18 1259  BP: (!) 150/94  Pulse: 98  Resp: 16  Temp: 98.5 F (36.9 C)  SpO2: 98%   Filed Weights   10/19/18 1259  Weight: 186 lb 12.8 oz (84.7 kg)   Body  mass index is 32.06 kg/m.  Wt Readings from Last 3 Encounters:  10/19/18 186 lb 12.8 oz (84.7 kg)  05/26/18 183 lb (83 kg)  11/23/17 187 lb (84.8 kg)     Physical Exam GENERAL APPEARANCE: Appears stated age, well appearing, NAD EYES: conjunctiva clear, no icterus HEENT: bilateral tympanic membranes and ear canals normal, oropharynx with mild erythema, tenderness in maxillary and ethmoid sinuses, no thyromegaly, trachea midline, no cervical or supraclavicular lymphadenopathy LUNGS: Clear to auscultation without wheeze or crackles, unlabored breathing, good air entry bilaterally CARDIOVASCULAR: Normal S1,S2 without murmurs, no edema SKIN: warm, dry        Assessment & Plan:   See Problem List for Assessment and Plan of chronic medical problems.

## 2018-10-19 NOTE — Telephone Encounter (Signed)
Spoke with pt to advise that Dr Camila Li would not be able to work her in and would be out of the office until Wednesday and it would be best if she would make an appt with another provider.

## 2018-10-19 NOTE — Telephone Encounter (Signed)
Copied from [place] [id number]. Topic: Appointment Scheduling - Scheduling Inquiry for Clinic >> Oct 19, 2018  8:04 AM Virl Axe D wrote: Reason for CRM: Pt called to schedule an OV with Dr. Alain Marion. She has been having a sore throat/ear ache/coughing with slight blood in her mucus. She wants to be seen by Dr. Alain Marion as soon as possible and would like to be fit into his schedule. Please advise. CB#[phone]

## 2018-11-24 ENCOUNTER — Telehealth: Payer: Self-pay | Admitting: Internal Medicine

## 2018-11-24 DIAGNOSIS — I10 Essential (primary) hypertension: Secondary | ICD-10-CM

## 2018-11-24 NOTE — Telephone Encounter (Signed)
Patient called to reschedule her visit with Dr Alain Marion to October. She would like to have labs done prior to this visit so that results are back for her visit. She would like to make sure that a urinalysis is entered to have done with the others that are already in the system. Can this be added please?

## 2018-11-24 NOTE — Telephone Encounter (Signed)
lab entered

## 2018-11-28 ENCOUNTER — Ambulatory Visit: Payer: Medicare Other | Admitting: Internal Medicine

## 2019-05-01 ENCOUNTER — Telehealth: Payer: Self-pay | Admitting: Internal Medicine

## 2019-05-01 MED ORDER — CIPROFLOXACIN HCL 500 MG PO TABS: 500.0000 mg | ORAL_TABLET | Freq: Two times a day (BID) | ORAL | 0 refills | Status: DC

## 2019-05-01 MED ORDER — METRONIDAZOLE 250 MG PO TABS: 250.0000 mg | ORAL_TABLET | Freq: Three times a day (TID) | ORAL | 0 refills | Status: DC

## 2019-05-01 NOTE — Telephone Encounter (Signed)
Pt states she thinks she is having a diverticulitis flare. Reports her PCP has treated her in the past. She started having abd pain about a week ago, pain in the LLQ. Reports no fever. She has scheduled an appt to see Tye Savoy NP but wanted to know if she can be started on antibioitics prior to appt. Please advise.

## 2019-05-01 NOTE — Telephone Encounter (Signed)
Spoke with pt and she is aware, prescriptions sent to pharmacy.

## 2019-05-01 NOTE — Telephone Encounter (Signed)
Yes, but she will need to keep her appt with Nevin Bloodgood Cipro 500 mg BID, Flagyl 250 mg TID x 7 days Call if worsening

## 2019-05-04 ENCOUNTER — Telehealth: Payer: Self-pay | Admitting: Internal Medicine

## 2019-05-04 NOTE — Telephone Encounter (Signed)
Pt wanted to know if it is ok for her to drink her keifer while on antibiotics. Discussed with her that should be fine. She also wanted to know what foods to eat, discussed a low residue diet. She will stay away from her metamucil while on the antibiotics.

## 2019-05-04 NOTE — Telephone Encounter (Signed)
Pt called again, she states that she needs to speak about her diets more than her medications, she has questions about taking kefir and metamucil and how to adjust her diet. She would like a call back today.

## 2019-05-09 ENCOUNTER — Ambulatory Visit (INDEPENDENT_AMBULATORY_CARE_PROVIDER_SITE_OTHER): Payer: Medicare Other | Admitting: Nurse Practitioner

## 2019-05-09 ENCOUNTER — Other Ambulatory Visit: Payer: Self-pay

## 2019-05-09 ENCOUNTER — Encounter: Payer: Self-pay | Admitting: Nurse Practitioner

## 2019-05-09 ENCOUNTER — Telehealth: Payer: Self-pay | Admitting: Nurse Practitioner

## 2019-05-09 VITALS — Temp 97.9°F | Ht 63.0 in | Wt 182.0 lb

## 2019-05-09 DIAGNOSIS — R1032 Left lower quadrant pain: Secondary | ICD-10-CM | POA: Diagnosis not present

## 2019-05-09 DIAGNOSIS — K581 Irritable bowel syndrome with constipation: Secondary | ICD-10-CM | POA: Diagnosis not present

## 2019-05-09 MED ORDER — DICYCLOMINE HCL 10 MG PO CAPS: 10.0000 mg | ORAL_CAPSULE | Freq: Two times a day (BID) | ORAL | 0 refills | Status: DC | PRN

## 2019-05-09 NOTE — Patient Instructions (Addendum)
If you are age 75 or older, your body mass index should be between 23-30. Your Body mass index is 32.24 kg/m. If this is out of the aforementioned range listed, please consider follow up with your Primary Care Provider.  If you are age 32 or younger, your body mass index should be between 19-25. Your Body mass index is 32.24 kg/m. If this is out of the aformentioned range listed, please consider follow up with your Primary Care Provider.   We have sent the following medications to your pharmacy for you to pick up at your convenience: Bentyl 10 mg  Continue Metamucil daily.  Call if not continuing to feel better.  Thank you for choosing me and [facility].   Tye Savoy, NP

## 2019-05-09 NOTE — Progress Notes (Signed)
Chief Complaint:    Diverticulitis follow up   IMPRESSION and PLAN:    73 year old female with chronic, intermittent LLQ discomfort exacerbated by stress, constipation and now bending over.  Generally she is able to manage episodes by adjusting her diet and taking daily Metamucil.  We called in a round of antibiotics several days ago for possible diverticulitis.  Patient has diverticulosis but I do not know that these episodes of LLQ pain represent diverticulitis. I suspect pain secondary IBS and maybe a component of musculoskeletal pain.  -Recommend she take Metamucil every day -Trial of Bentyl 10 mg twice daily as needed for abdominal pain -If patient has worsening LLQ pain again then I think she needs imaging to rule out or confirm diverticulitis. She will let us know    HPI:     Patient is a 73 year old female known to Dr. Hilarie Fredrickson.  She has a history of IBS, diverticulosis, GERD, hypertension, hyperlipidemia, fibromyalgia and anxiety.  Patient called the office on the 14th of this month with complaints of diverticulitis flare type symptoms.  PCP treated her for diverticulitis several years ago and her symptoms felt similar. We called in Cipro and Flagyl x7-days, she is here for follow up.   Laura Vasquez feels better for the most part, still some residual LLQ pain.  She has had this intermittent LLQ pain for many years, it is particularly worse during stressful times.  Sometimes she feels there is a pocket of gas in left abdomen and her stools are  "squiggly".  Generally she can adjust her diet, take Metamucil and pain eventually subsides.  She was unable to manage the pain several days ago which is why she called the office.  No fevers. No vaginal discharge. She has dysuria but says that it has already been evaluated. Over the last couple days she has noticed the LLQ pain is exacerbated by bending over.    Review of systems:     No chest pain, no SOB, no fevers, no urinary sx   Past Medical  History:  Diagnosis Date  . Allergy   . Anxiety   . Arrhythmia   . Diverticulosis   . Erosive esophagitis   . Fatty liver   . Fibromyalgia   . GERD (gastroesophageal reflux disease)   . Hiatal hernia   . Hyperlipidemia   . Hyperplastic colon polyp   . Hypertension   . IBS (irritable bowel syndrome)   . Mitral regurgitation   . Osteoarthritis   . Palpitations   . Peptic stricture of esophagus   . Vitamin B12 deficiency     Patient's surgical history, family medical history, social history, medications and allergies were all reviewed in Epic    Current Outpatient Medications  Medication Sig Dispense Refill  . aspirin EC 81 MG tablet Take 81 mg by mouth daily.    . B Complex Vitamins (B COMPLEX PO) Take 1 tablet by mouth daily as needed.     . calcium carbonate (TUMS - DOSED IN MG ELEMENTAL CALCIUM) 500 MG chewable tablet Chew 1 tablet by mouth as needed.     . cholecalciferol (VITAMIN D) 1000 UNITS tablet Take 2,000 Units by mouth daily.     . cyanocobalamin 500 MCG tablet Take 500 mcg by mouth daily as needed (Alternate b/t vitamin b complex).     . diazepam (VALIUM) 5 MG tablet Take 0.5-1 tablets (2.5-5 mg total) by mouth every 12 (twelve) hours as needed for anxiety. 60 tablet  5  . MAGNESIUM PO Take 800 mg by mouth.     . meclizine (ANTIVERT) 25 MG tablet Take 1 tablet (25 mg total) by mouth 3 (three) times daily as needed for dizziness. (Patient taking differently: Take 25 mg by mouth as needed for dizziness. ) 30 tablet 1  . Omega-3 Fatty Acids (FISH OIL PO) Take 1-2 each by mouth daily as needed.     Marland Kitchen omeprazole (PRILOSEC) 20 MG capsule TAKE 1 CAPSULE BY MOUTH ONCE DAILY 30 capsule 11  . propranolol (INDERAL) 10 MG tablet take 1 tablet by mouth twice a day (Patient taking differently: Take 10 mg by mouth daily. ) 60 tablet 5  . Psyllium (METAMUCIL PO) Take by mouth daily. 1 tablespoon     No current facility-administered medications for this visit.     Physical Exam:      Temp 97.9 F (36.6 C)   Ht 5\' 3"  (1.6 m)   Wt 182 lb (82.6 kg)   BMI 32.24 kg/m   GENERAL:  Pleasant female in NAD PSYCH: : Cooperative, normal affect EENT:  conjunctiva pink, mucous membranes moist, neck supple without masses CARDIAC:  RRR,  no peripheral edema PULM: Normal respiratory effort, lungs CTA bilaterally, no wheezing ABDOMEN:  Nondistended, soft, nontender. No obvious masses, no hepatomegaly,  normal bowel sounds SKIN:  turgor, no lesions seen Musculoskeletal:  Normal muscle tone, normal strength NEURO: Alert and oriented x 3, no focal neurologic deficits  I spent 25 minutes of face-to-face time with the patient. Greater than 50% of the time was spent counseling and coordinating care. Questions answered  Tye Savoy , NP 05/09/2019, 2:49 PM

## 2019-05-09 NOTE — Telephone Encounter (Signed)
Pt is requesting call back.  °

## 2019-05-09 NOTE — Telephone Encounter (Signed)
Peter Congo, let's try Levsin 0.125 mg SL Q 12 hours prn #30. Thanks

## 2019-05-10 ENCOUNTER — Other Ambulatory Visit: Payer: Self-pay

## 2019-05-10 MED ORDER — HYOSCYAMINE SULFATE 0.125 MG SL SUBL: 0.1250 mg | SUBLINGUAL_TABLET | Freq: Two times a day (BID) | SUBLINGUAL | 0 refills | Status: DC | PRN

## 2019-05-10 NOTE — Telephone Encounter (Signed)
New prescription sent to pharmacy.  Pt advised.

## 2019-05-10 NOTE — Progress Notes (Signed)
levsin

## 2019-05-16 ENCOUNTER — Other Ambulatory Visit: Payer: Self-pay

## 2019-05-16 ENCOUNTER — Telehealth: Payer: Self-pay | Admitting: Nurse Practitioner

## 2019-05-16 DIAGNOSIS — R1032 Left lower quadrant pain: Secondary | ICD-10-CM

## 2019-05-16 NOTE — Telephone Encounter (Signed)
Ulice Dash, please see my office note on patient. I'm not sure these episodes of LLQ are secondary to diverticulitis. Since she didn't get better with antibiotics I wanted to get a CT scan. She was reluctant to get a scan due to radiation exposure that she hasn't been imaging in many years. She is adamant about getting an ultrasound instead to evaluate for  diverticulitis but question sensitivity of that study? What has been your experience with diverticulitis being detected on [place]? Thanks

## 2019-05-16 NOTE — Telephone Encounter (Signed)
Pt called asking to speak with you about her visit with Nevin Bloodgood last week. Pls call her.

## 2019-05-16 NOTE — Telephone Encounter (Signed)
I agree that ultrasound is not a good modality to evaluate the bowel.  It is much better with solid organs.  Given her preference I would proceed first with complete abdominal ultrasound.  If persistent pain and ultrasound unremarkable then CT abdomen pelvis with contrast is recommended.

## 2019-05-16 NOTE — Telephone Encounter (Signed)
Pt scheduled for [place] of abd at [facility] 05/19/19@8 :30am, pt to arrive there at 8:15am. Pt to be NPO after midnight. Pt aware of appt.

## 2019-05-16 NOTE — Telephone Encounter (Signed)
Pt states she has finished the antibiotics. She did not take the bentyl or the levsin due to the listed side effects, states she was still having side effects from the antibiotics. Pt is still having discomfort in her LLQ. States Nevin Bloodgood had mentioned if the pain continued she may need a CT scan but she didn't want that she mentioned she would rather have an [place]. Please advise.

## 2019-05-18 NOTE — Progress Notes (Signed)
Addendum: Reviewed and agree with assessment and management plan. Pyrtle, Jay M, MD  

## 2019-05-19 ENCOUNTER — Other Ambulatory Visit: Payer: Self-pay

## 2019-05-19 ENCOUNTER — Ambulatory Visit (HOSPITAL_COMMUNITY)
Admission: RE | Admit: 2019-05-19 | Discharge: 2019-05-19 | Disposition: A | Discharge status: Home / Self Care | Payer: Medicare Other | Source: Ambulatory Visit | Attending: Internal Medicine | Admitting: Internal Medicine

## 2019-05-19 DIAGNOSIS — R1032 Left lower quadrant pain: Secondary | ICD-10-CM | POA: Diagnosis not present

## 2019-05-23 ENCOUNTER — Telehealth: Payer: Self-pay | Admitting: Internal Medicine

## 2019-05-23 NOTE — Telephone Encounter (Signed)
See result notes. 

## 2019-05-24 ENCOUNTER — Other Ambulatory Visit (INDEPENDENT_AMBULATORY_CARE_PROVIDER_SITE_OTHER): Payer: Medicare Other

## 2019-05-24 DIAGNOSIS — I10 Essential (primary) hypertension: Secondary | ICD-10-CM

## 2019-05-24 DIAGNOSIS — R42 Dizziness and giddiness: Secondary | ICD-10-CM | POA: Diagnosis not present

## 2019-05-24 DIAGNOSIS — E538 Deficiency of other specified B group vitamins: Secondary | ICD-10-CM

## 2019-05-24 LAB — LIPID PANEL
Cholesterol: 246 mg/dL — ABNORMAL HIGH (ref 0–200)
HDL: 80.9 mg/dL (ref >39.00)
LDL Cholesterol: 146 mg/dL — ABNORMAL HIGH (ref 0–99)
NonHDL: 164.86
Total CHOL/HDL Ratio: 3
Triglycerides: 96 mg/dL (ref 0.0–149.0)
VLDL: 19.2 mg/dL (ref 0.0–40.0)

## 2019-05-24 LAB — CBC WITH DIFFERENTIAL/PLATELET
Basophils Absolute: 0 10*3/uL (ref 0.0–0.1)
Basophils Relative: 0.2 % (ref 0.0–3.0)
Eosinophils Absolute: 0.1 10*3/uL (ref 0.0–0.7)
Eosinophils Relative: 0.6 % (ref 0.0–5.0)
HCT: 47.6 % — ABNORMAL HIGH (ref 36.0–46.0)
Hemoglobin: 16.1 g/dL — ABNORMAL HIGH (ref 12.0–15.0)
Lymphocytes Relative: 17.7 % (ref 12.0–46.0)
Lymphs Abs: 1.8 10*3/uL (ref 0.7–4.0)
MCHC: 33.8 g/dL (ref 30.0–36.0)
MCV: 91.2 fl (ref 78.0–100.0)
Monocytes Absolute: 0.8 10*3/uL (ref 0.1–1.0)
Monocytes Relative: 8 % (ref 3.0–12.0)
Neutro Abs: 7.6 10*3/uL (ref 1.4–7.7)
Neutrophils Relative %: 73.5 % (ref 43.0–77.0)
Platelets: 230 10*3/uL (ref 150.0–400.0)
RBC: 5.22 Mil/uL — ABNORMAL HIGH (ref 3.87–5.11)
RDW: 13.8 % (ref 11.5–15.5)
WBC: 10.4 10*3/uL (ref 4.0–10.5)

## 2019-05-24 LAB — BASIC METABOLIC PANEL
BUN: 9 mg/dL (ref 6–23)
CO2: 26 mEq/L (ref 19–32)
Calcium: 10.8 mg/dL — ABNORMAL HIGH (ref 8.4–10.5)
Chloride: 102 mEq/L (ref 96–112)
Creatinine, Ser: 0.82 mg/dL (ref 0.40–1.20)
GFR: 67.9 mL/min (ref >60.00)
Glucose, Bld: 110 mg/dL — ABNORMAL HIGH (ref 70–99)
Potassium: 5 mEq/L (ref 3.5–5.1)
Sodium: 139 mEq/L (ref 135–145)

## 2019-05-24 LAB — URINALYSIS, ROUTINE W REFLEX MICROSCOPIC
Bilirubin Urine: NEGATIVE
Hgb urine dipstick: NEGATIVE
Ketones, ur: NEGATIVE
Leukocytes,Ua: NEGATIVE
Nitrite: NEGATIVE
RBC / HPF: NONE SEEN (ref 0–?)
Specific Gravity, Urine: <=1.005 — AB (ref 1.000–1.030)
Total Protein, Urine: NEGATIVE
Urine Glucose: NEGATIVE
Urobilinogen, UA: 0.2 (ref 0.0–1.0)
pH: 6.5 (ref 5.0–8.0)

## 2019-05-24 LAB — HEPATIC FUNCTION PANEL
ALT: 24 U/L (ref 0–35)
AST: 24 U/L (ref 0–37)
Albumin: 4.7 g/dL (ref 3.5–5.2)
Alkaline Phosphatase: 40 U/L (ref 39–117)
Bilirubin, Direct: 0.2 mg/dL (ref 0.0–0.3)
Total Bilirubin: 1.4 mg/dL — ABNORMAL HIGH (ref 0.2–1.2)
Total Protein: 8.3 g/dL (ref 6.0–8.3)

## 2019-05-24 LAB — TSH: TSH: 2.52 u[IU]/mL (ref 0.35–4.50)

## 2019-05-26 ENCOUNTER — Ambulatory Visit (INDEPENDENT_AMBULATORY_CARE_PROVIDER_SITE_OTHER): Payer: Medicare Other | Admitting: Nurse Practitioner

## 2019-05-26 ENCOUNTER — Other Ambulatory Visit (INDEPENDENT_AMBULATORY_CARE_PROVIDER_SITE_OTHER): Payer: Medicare Other

## 2019-05-26 ENCOUNTER — Encounter: Payer: Self-pay | Admitting: Nurse Practitioner

## 2019-05-26 VITALS — BP 135/78 | HR 94 | Temp 97.8°F | Ht 64.0 in | Wt 178.2 lb

## 2019-05-26 DIAGNOSIS — R1032 Left lower quadrant pain: Secondary | ICD-10-CM | POA: Diagnosis not present

## 2019-05-26 DIAGNOSIS — K760 Fatty (change of) liver, not elsewhere classified: Secondary | ICD-10-CM | POA: Diagnosis not present

## 2019-05-26 DIAGNOSIS — K219 Gastro-esophageal reflux disease without esophagitis: Secondary | ICD-10-CM | POA: Diagnosis not present

## 2019-05-26 DIAGNOSIS — D582 Other hemoglobinopathies: Secondary | ICD-10-CM

## 2019-05-26 LAB — IBC + FERRITIN
Ferritin: 367.8 ng/mL — ABNORMAL HIGH (ref 10.0–291.0)
Iron: 49 ug/dL (ref 42–145)
Saturation Ratios: 15.8 % — ABNORMAL LOW (ref 20.0–50.0)
Transferrin: 222 mg/dL (ref 212.0–360.0)

## 2019-05-26 LAB — BILIRUBIN, TOTAL: Total Bilirubin: 0.7 mg/dL (ref 0.2–1.2)

## 2019-05-26 MED ORDER — NA SULFATE-K SULFATE-MG SULF 17.5-3.13-1.6 GM/177ML PO SOLN: ORAL | 0 refills | Status: DC

## 2019-05-26 NOTE — Patient Instructions (Signed)
If you are age 75 or older, your body mass index should be between 23-30. Your Body mass index is 30.59 kg/m. If this is out of the aforementioned range listed, please consider follow up with your Primary Care Provider.  If you are age 9 or younger, your body mass index should be between 19-25. Your Body mass index is 30.59 kg/m. If this is out of the aformentioned range listed, please consider follow up with your Primary Care Provider.   You have been scheduled for an endoscopy and colonoscopy. Please follow the written instructions given to you at your visit today. Please pick up your prep supplies at the pharmacy within the next 1-3 days. If you use inhalers (even only as needed), please bring them with you on the day of your procedure. Your physician has requested that you go to www.startemmi.com and enter the access code given to you at your visit today. This web site gives a general overview about your procedure. However, you should still follow specific instructions given to you by our office regarding your preparation for the procedure.  We have sent the following medications to your pharmacy for you to pick up at your convenience: [place] provider has requested that you go to the basement level for lab work before leaving today. Press "B" on the elevator. The lab is located at the first door on the left as you exit the elevator.  Thank you for choosing me and [facility].   Tye Savoy, NP

## 2019-05-30 ENCOUNTER — Encounter: Payer: Self-pay | Admitting: Nurse Practitioner

## 2019-05-30 ENCOUNTER — Encounter: Payer: Self-pay | Admitting: Internal Medicine

## 2019-05-30 ENCOUNTER — Other Ambulatory Visit: Payer: Self-pay

## 2019-05-30 ENCOUNTER — Ambulatory Visit (INDEPENDENT_AMBULATORY_CARE_PROVIDER_SITE_OTHER): Payer: Medicare Other | Admitting: Internal Medicine

## 2019-05-30 VITALS — HR 67 | Temp 98.3°F | Ht 64.0 in | Wt 178.0 lb

## 2019-05-30 DIAGNOSIS — K589 Irritable bowel syndrome without diarrhea: Secondary | ICD-10-CM

## 2019-05-30 DIAGNOSIS — E6609 Other obesity due to excess calories: Secondary | ICD-10-CM

## 2019-05-30 DIAGNOSIS — Z23 Encounter for immunization: Secondary | ICD-10-CM | POA: Diagnosis not present

## 2019-05-30 DIAGNOSIS — I10 Essential (primary) hypertension: Secondary | ICD-10-CM

## 2019-05-30 DIAGNOSIS — D751 Secondary polycythemia: Secondary | ICD-10-CM

## 2019-05-30 DIAGNOSIS — E538 Deficiency of other specified B group vitamins: Secondary | ICD-10-CM

## 2019-05-30 DIAGNOSIS — K2100 Gastro-esophageal reflux disease with esophagitis, without bleeding: Secondary | ICD-10-CM

## 2019-05-30 DIAGNOSIS — K760 Fatty (change of) liver, not elsewhere classified: Secondary | ICD-10-CM | POA: Diagnosis not present

## 2019-05-30 DIAGNOSIS — E785 Hyperlipidemia, unspecified: Secondary | ICD-10-CM

## 2019-05-30 DIAGNOSIS — Z6833 Body mass index (BMI) 33.0-33.9, adult: Secondary | ICD-10-CM

## 2019-05-30 NOTE — Assessment & Plan Note (Signed)
Propranolol Pt refused BP check

## 2019-05-30 NOTE — Assessment & Plan Note (Signed)
Cardiac cor calcium CT offered - declined

## 2019-05-30 NOTE — Progress Notes (Signed)
Chief Complaint:    Follow up LLQ pain  IMPRESSION and PLAN:    1. 73 yo female with intermittent LLQ pain. Diverticulitis seems less likely given intermittent nature of pain. Suspect IBS. She refuses CT scan because of radiation exposure. Per her request we got an [place] but not surprisingly it was normal. - I do think we need to move on with further testing.  Colonoscopy generally not the best test for evaluation of abdominal pain but she is adamantly opposed to CT scan limiting our options. The risks and benefits of colonoscopy with possible polypectomy / biopsies were discussed and the patient agrees to proceed.   2. Hepatic steatosis. We discussed cause, usual treatment, potential course of steatosis.  -Encouraged weight loss -Liver tests normal except for mildly elevated bilirubin which I will repeat today.   3. GERD. On Omeprazole. Was having breakthrough symptoms -improved with dietary changes. Still takes TUMS sometimes  4. Chronically elevated hemoglobin -check iron studies  5. Multiple drug allergies    HPI:     Patient is a 73 yo female who I saw 05/09/19 for evaluation of LLQ pain. She had been treated empirically for diverticulitis but symptoms had not significantly improved after course of antibiotics. The nature of her LLQ pain seemed atypical for diverticulitis, concern was more for IBS. Started daily fiber, trial of Bentyl with plans for imaging if failed to improve. She has opposed to CT scan due to radiation exposure and wanted ultrasound only. She did call the office a few days later with ongoing symptoms. We suggested CT scan but she was adamant about [place] only despite poor sensitivity of the exam for diverticulitis. Per her request we proceeded with [place] which was negative. She is here for follow up.   [place] took one dose of Bentyl and it helped what she called terrible gas pain. Hasn't required any further doses. She has good days and bad days. Some days the LLQ pain  is bothersome, other days not. Splinting abdomen helps. BMs are pretty normal. Was having breakthrough GERD symptoms on Omeprazole but improved with dietary changes. Still needs TUMs on occasion.   Data Reviewed:  05/24/19 hgb 16.1, hct 47.6 Normal renal function Tbili 1.4, liver tests o/w normal.   Review of systems:     No chest pain, no SOB, no fevers, no urinary sx   Past Medical History:  Diagnosis Date  . Allergy   . Anxiety   . Arrhythmia   . Diverticulosis   . Erosive esophagitis   . Fatty liver   . Fibromyalgia   . GERD (gastroesophageal reflux disease)   . Hiatal hernia   . Hyperlipidemia   . Hyperplastic colon polyp   . Hypertension   . IBS (irritable bowel syndrome)   . Mitral regurgitation   . Osteoarthritis   . Palpitations   . Peptic stricture of esophagus   . Vitamin B12 deficiency     Patient's surgical history, family medical history, social history, medications and allergies were all reviewed in Epic   Serum creatinine: 0.82 mg/dL 05/24/19 0841 Estimated creatinine clearance: 60.9 mL/min  Current Outpatient Medications  Medication Sig Dispense Refill  . aspirin EC 81 MG tablet Take 81 mg by mouth daily.    . B Complex Vitamins (B COMPLEX PO) Take 1 tablet by mouth daily as needed.     . calcium carbonate (TUMS - DOSED IN MG ELEMENTAL CALCIUM) 500 MG chewable tablet Chew 1 tablet by  mouth as needed.     . cholecalciferol (VITAMIN D) 1000 UNITS tablet Take 2,000 Units by mouth daily.     . cyanocobalamin 500 MCG tablet Take 500 mcg by mouth daily as needed (Alternate b/t vitamin b complex).     . diazepam (VALIUM) 5 MG tablet Take 0.5-1 tablets (2.5-5 mg total) by mouth every 12 (twelve) hours as needed for anxiety. 60 tablet 5  . dicyclomine (BENTYL) 10 MG capsule Take 10 mg by mouth 2 (two) times daily.    Marland Kitchen MAGNESIUM PO Take 800 mg by mouth.     . meclizine (ANTIVERT) 25 MG tablet Take 1 tablet (25 mg total) by mouth 3 (three) times daily as needed  for dizziness. (Patient taking differently: Take 25 mg by mouth as needed for dizziness. ) 30 tablet 1  . Omega-3 Fatty Acids (FISH OIL PO) Take 1-2 each by mouth daily as needed.     Marland Kitchen omeprazole (PRILOSEC) 20 MG capsule TAKE 1 CAPSULE BY MOUTH ONCE DAILY 30 capsule 11  . propranolol (INDERAL) 10 MG tablet take 1 tablet by mouth twice a day (Patient taking differently: Take 10 mg by mouth daily. ) 60 tablet 5  . Psyllium (METAMUCIL PO) Take by mouth daily. 1 tablespoon    . Na Sulfate-K Sulfate-Mg Sulf 17.5-3.13-1.6 GM/177ML SOLN Suprep-Use as directed 354 mL 0   No current facility-administered medications for this visit.     Physical Exam:     BP 135/78   Pulse 94   Temp 97.8 F (36.6 C)   Ht 5\' 4"  (1.626 m)   Wt 178 lb 3.2 oz (80.8 kg)   BMI 30.59 kg/m   GENERAL:  Pleasant female in NAD PSYCH: : Cooperative, normal affect EENT:  conjunctiva pink, mucous membranes moist, neck supple without masses CARDIAC:  RRR,  no peripheral edema PULM: Normal respiratory effort, lungs CTA bilaterally, no wheezing ABDOMEN:  Nondistended, soft, mild LLQ tenderness. No obvious masses, no hepatomegaly,  normal bowel sounds SKIN:  turgor, no lesions seen Musculoskeletal:  Normal muscle tone, normal strength NEURO: Alert and oriented x 3, no focal neurologic deficits   Tye Savoy , NP 05/30/2019, 8:32 AM

## 2019-05-30 NOTE — Assessment & Plan Note (Signed)
On B12 

## 2019-05-30 NOTE — Assessment & Plan Note (Signed)
Diet discussed 

## 2019-05-30 NOTE — Addendum Note (Signed)
Addended by: Karren Cobble on: 05/30/2019 01:23 PM   Modules accepted: Orders

## 2019-05-30 NOTE — Assessment & Plan Note (Signed)
Colonosc/EGD is scheduled

## 2019-05-30 NOTE — Progress Notes (Signed)
Subjective:  Patient ID: Laura Vasquez, female    DOB: 27-Feb-1946  Age: 73 y.o. MRN: [id number]  CC: No chief complaint on file.   HPI [facility] presents for HTN, GERD, anxiety f/u S/p GI visit w/Dr Hilarie Fredrickson for RUQ abd pain -- fatty liver on [place]; she took Flagyl and Cipro. Colonosc/EGD is scheduled BP ok at home   Outpatient Medications Prior to Visit  Medication Sig Dispense Refill  . aspirin EC 81 MG tablet Take 81 mg by mouth daily.    . B Complex Vitamins (B COMPLEX PO) Take 1 tablet by mouth daily as needed.     . calcium carbonate (TUMS - DOSED IN MG ELEMENTAL CALCIUM) 500 MG chewable tablet Chew 1 tablet by mouth as needed.     . cholecalciferol (VITAMIN D) 1000 UNITS tablet Take 2,000 Units by mouth daily.     . cyanocobalamin 500 MCG tablet Take 500 mcg by mouth daily as needed (Alternate b/t vitamin b complex).     . diazepam (VALIUM) 5 MG tablet Take 0.5-1 tablets (2.5-5 mg total) by mouth every 12 (twelve) hours as needed for anxiety. 60 tablet 5  . dicyclomine (BENTYL) 10 MG capsule Take 10 mg by mouth 2 (two) times daily.    Marland Kitchen MAGNESIUM PO Take 800 mg by mouth.     . meclizine (ANTIVERT) 25 MG tablet Take 1 tablet (25 mg total) by mouth 3 (three) times daily as needed for dizziness. (Patient taking differently: Take 25 mg by mouth as needed for dizziness. ) 30 tablet 1  . Na Sulfate-K Sulfate-Mg Sulf 17.5-3.13-1.6 GM/177ML SOLN Suprep-Use as directed 354 mL 0  . Omega-3 Fatty Acids (FISH OIL PO) Take 1-2 each by mouth daily as needed.     Marland Kitchen omeprazole (PRILOSEC) 20 MG capsule TAKE 1 CAPSULE BY MOUTH ONCE DAILY 30 capsule 11  . propranolol (INDERAL) 10 MG tablet take 1 tablet by mouth twice a day (Patient taking differently: Take 10 mg by mouth daily. ) 60 tablet 5  . Psyllium (METAMUCIL PO) Take by mouth daily. 1 tablespoon     No facility-administered medications prior to visit.     ROS: Review of Systems  Constitutional: Negative for activity change, appetite  change, chills, fatigue and unexpected weight change.  HENT: Negative for congestion, mouth sores and sinus pressure.   Eyes: Negative for visual disturbance.  Respiratory: Negative for cough and chest tightness.   Gastrointestinal: Positive for abdominal pain. Negative for nausea.  Genitourinary: Negative for difficulty urinating, frequency and vaginal pain.  Musculoskeletal: Negative for back pain and gait problem.  Skin: Negative for pallor and rash.  Neurological: Negative for dizziness, tremors, weakness, numbness and headaches.  Psychiatric/Behavioral: Negative for confusion, sleep disturbance and suicidal ideas.    Objective:  There were no vitals taken for this visit.  BP Readings from Last 3 Encounters:  05/26/19 135/78  10/19/18 (!) 150/94  11/23/17 (!) 142/92    Wt Readings from Last 3 Encounters:  05/26/19 178 lb 3.2 oz (80.8 kg)  05/09/19 182 lb (82.6 kg)  10/19/18 186 lb 12.8 oz (84.7 kg)    Physical Exam Constitutional:      General: She is not in acute distress.    Appearance: She is well-developed.  HENT:     Head: Normocephalic.     Right Ear: External ear normal.     Left Ear: External ear normal.     Nose: Nose normal.  Eyes:  General:        Right eye: No discharge.        Left eye: No discharge.     Conjunctiva/sclera: Conjunctivae normal.     Pupils: Pupils are equal, round, and reactive to light.  Neck:     Musculoskeletal: Normal range of motion and neck supple.     Thyroid: No thyromegaly.     Vascular: No JVD.     Trachea: No tracheal deviation.  Cardiovascular:     Rate and Rhythm: Normal rate and regular rhythm.     Heart sounds: Normal heart sounds.  Pulmonary:     Effort: No respiratory distress.     Breath sounds: No stridor. No wheezing.  Abdominal:     General: Bowel sounds are normal. There is no distension.     Palpations: Abdomen is soft. There is no mass.     Tenderness: There is no abdominal tenderness. There is no  guarding or rebound.  Musculoskeletal:        General: No tenderness.  Lymphadenopathy:     Cervical: No cervical adenopathy.  Skin:    Findings: No erythema or rash.  Neurological:     Cranial Nerves: No cranial nerve deficit.     Motor: No abnormal muscle tone.     Coordination: Coordination normal.     Deep Tendon Reflexes: Reflexes normal.  Psychiatric:        Behavior: Behavior normal.        Thought Content: Thought content normal.        Judgment: Judgment normal.     Lab Results  Component Value Date   WBC 10.4 05/24/2019   HGB 16.1 (H) 05/24/2019   HCT 47.6 (H) 05/24/2019   PLT 230.0 05/24/2019   GLUCOSE 110 (H) 05/24/2019   CHOL 246 (H) 05/24/2019   TRIG 96.0 05/24/2019   HDL 80.90 05/24/2019   LDLDIRECT 172.9 04/03/2013   LDLCALC 146 (H) 05/24/2019   ALT 24 05/24/2019   AST 24 05/24/2019   NA 139 05/24/2019   K 5.0 05/24/2019   CL 102 05/24/2019   CREATININE 0.82 05/24/2019   BUN 9 05/24/2019   CO2 26 05/24/2019   TSH 2.52 05/24/2019   MICROALBUR <0.7 05/21/2017    US Abdomen Complete  Result Date: 05/19/2019 CLINICAL DATA:  Abdominal plain.  History of cholecystectomy. EXAM: ABDOMEN ULTRASOUND COMPLETE COMPARISON:  Abdominal ultrasound 11/02/2008, CT abdomen pelvis 10/13/2013 FINDINGS: Gallbladder: Surgically absent Common bile duct: Diameter: 0.5 cm Liver: No focal lesion identified. The parenchymal echogenicity is diffusely increased. Portal vein is patent on color Doppler imaging with normal direction of blood flow towards the liver. IVC: No abnormality visualized. Pancreas: Poorly visualized. Spleen: Size and appearance within normal limits. Right Kidney: Length: 9.1 cm. Echogenicity within normal limits. No mass or hydronephrosis visualized. Left Kidney: Length: 10 cm. Echogenicity within normal limits. No mass or hydronephrosis visualized. Abdominal aorta: No aneurysm visualized. Other findings: None. IMPRESSION: 1.  No sonographic finding to explain the  patient's abdominal pain. 2. Diffusely increased liver echogenicity as can be seen in hepatic steatosis. Electronically Signed   By: Audie Pinto M.D.   On: 05/19/2019 09:25    Assessment & Plan:   There are no diagnoses linked to this encounter.   No orders of the defined types were placed in this encounter.    Follow-up: No follow-ups on file.  Walker Kehr, MD

## 2019-05-30 NOTE — Patient Instructions (Signed)
Cardiac CT calcium scoring test $150   Computed tomography, more commonly known as a CT or CAT scan, is a diagnostic medical imaging test. Like traditional x-rays, it produces multiple images or pictures of the inside of the body. The cross-sectional images generated during a CT scan can be reformatted in multiple planes. They can even generate three-dimensional images. These images can be viewed on a computer monitor, printed on film or by a 3D printer, or transferred to a CD or DVD. CT images of internal organs, bones, soft tissue and blood vessels provide greater detail than traditional x-rays, particularly of soft tissues and blood vessels. A cardiac CT scan for coronary calcium is a non-invasive way of obtaining information about the presence, location and extent of calcified plaque in the coronary arteries-the vessels that supply oxygen-containing blood to the heart muscle. Calcified plaque results when there is a build-up of fat and other substances under the inner layer of the artery. This material can calcify which signals the presence of atherosclerosis, a disease of the vessel wall, also called coronary artery disease (CAD). People with this disease have an increased risk for heart attacks. In addition, over time, progression of plaque build up (CAD) can narrow the arteries or even close off blood flow to the heart. The result may be chest pain, sometimes called "angina," or a heart attack. Because calcium is a marker of CAD, the amount of calcium detected on a cardiac CT scan is a helpful prognostic tool. The findings on cardiac CT are expressed as a calcium score. Another name for this test is coronary artery calcium scoring.  What are some common uses of the procedure? The goal of cardiac CT scan for calcium scoring is to determine if CAD is present and to what extent, even if there are no symptoms. It is a screening study that may be recommended by a physician for patients with risk factors  for CAD but no clinical symptoms. The major risk factors for CAD are: . high blood cholesterol levels  . family history of heart attacks  . diabetes  . high blood pressure  . cigarette smoking  . overweight or obese  . physical inactivity   A negative cardiac CT scan for calcium scoring shows no calcification within the coronary arteries. This suggests that CAD is absent or so minimal it cannot be seen by this technique. The chance of having a heart attack over the next two to five years is very low under these circumstances. A positive test means that CAD is present, regardless of whether or not the patient is experiencing any symptoms. The amount of calcification-expressed as the calcium score-may help to predict the likelihood of a myocardial infarction (heart attack) in the coming years and helps your medical doctor or cardiologist decide whether the patient may need to take preventive medicine or undertake other measures such as diet and exercise to lower the risk for heart attack. The extent of CAD is graded according to your calcium score:  Calcium Score  Presence of CAD (coronary artery disease)  0 No evidence of CAD   1-10 Minimal evidence of CAD  11-100 Mild evidence of CAD  101-400 Moderate evidence of CAD  Over 400 Extensive evidence of CAD     These suggestions will probably help you to improve your metabolism if you are not overweight and to lose weight if you are overweight: 1.  Reduce your consumption of sugars and starches.  Eliminate high fructose corn syrup from your   diet.  Reduce your consumption of processed foods.  For desserts try to have seasonal fruits, berries, nuts, cheeses or dark chocolate with more than 70% cacao. 2.  Do not snack 3.  You do not have to eat breakfast.  If you choose to have breakfast-eat plain greek yogurt, eggs, oatmeal (without sugar) 4.  Drink water, freshly brewed unsweetened tea (green, black or herbal) or coffee.  Do not drink sodas  including diet sodas , juices, beverages sweetened with artificial sweeteners. 5.  Reduce your consumption of refined grains. 6.  Avoid protein drinks such as Optifast, Slim fast etc. Eat chicken, fish, meat, dairy and beans for your sources of protein 7.  Natural unprocessed fats like cold pressed virgin olive oil, butter, coconut oil are good for you.  Eat avocados 8.  Increase your consumption of fiber.  Fruits, berries, vegetables, whole grains, flaxseeds, Chia seeds, beans, popcorn, nuts, oatmeal are good sources of fiber 9.  Use vinegar in your diet, i.e. apple cider vinegar, red wine or balsamic vinegar 10.  You can try fasting.  For example you can skip breakfast and lunch every other day (24-hour fast) 11.  Stress reduction, good night sleep, relaxation, meditation, yoga and other physical activity is likely to help you to maintain low weight too. 12.  If you drink alcohol, limit your alcohol intake to no more than 2 drinks a day.   Mediterranean diet is good for you. ([employer] has a typical Mediterranean cuisine menu) The Mediterranean diet is a way of eating based on the traditional cuisine of countries bordering the [place] Sea. While there is no single definition of the Mediterranean diet, it is typically high in vegetables, fruits, whole grains, beans, nut and seeds, and olive oil. The main components of Mediterranean diet include: . Daily consumption of vegetables, fruits, whole grains and healthy fats  . Weekly intake of fish, poultry, beans and eggs  . Moderate portions of dairy products  . Limited intake of red meat Other important elements of the Mediterranean diet are sharing meals with family and friends, enjoying a glass of red wine and being physically active. Health benefits of a Mediterranean diet: A traditional Mediterranean diet consisting of large quantities of fresh fruits and vegetables, nuts, fish and olive oil-coupled with physical activity-can reduce  your risk of serious mental and physical health problems by: Preventing heart disease and strokes. Following a Mediterranean diet limits your intake of refined breads, processed foods, and red meat, and encourages drinking red wine instead of hard liquor-all factors that can help prevent heart disease and stroke. Keeping you agile. If you're an older adult, the nutrients gained with a Mediterranean diet may reduce your risk of developing muscle weakness and other signs of frailty by about 70 percent. Reducing the risk of Alzheimer's. Research suggests that the Mediterranean diet may improve cholesterol, blood sugar levels, and overall blood vessel health, which in turn may reduce your risk of Alzheimer's disease or dementia. Halving the risk of Parkinson's disease. The high levels of antioxidants in the Mediterranean diet can prevent cells from undergoing a damaging process called oxidative stress, thereby cutting the risk of Parkinson's disease in half. Increasing longevity. By reducing your risk of developing heart disease or cancer with the Mediterranean diet, you're reducing your risk of death at any age by 20%. Protecting against type 2 diabetes. A Mediterranean diet is rich in fiber which digests slowly, prevents huge swings in blood sugar, and can help you maintain a healthy   Cabbage soup recipe that will not make you gain weight: Take 1 small head of cabbage, 1 average pack of celery, 4 green peppers, 4 onions, 2 cans diced tomatoes (they are not available without salt), salt and spices to taste.  Chop cabbage, celery, peppers and onions.  And tomatoes and 2-2.5 liters (2.5 quarts) of water so that it would just cover the vegetables.  Bring to boil.  Add spices and salt.  Turn heat to low/medium and simmer for 20-25 minutes.  Naturally, you can make a smaller batch and change some of the ingredients.   If you have medicare related insurance (such as traditional Medicare, [employer], [employer], or similar), Please make an appointment at the scheduling desk with Sharee Pimple, the [employer], for your Wellness visit in this office, which is a benefit with your insurance.

## 2019-05-30 NOTE — Assessment & Plan Note (Signed)
S/p GI visit w/Dr Pyrtle for RUQ abd pain -- fatty liver on [place]

## 2019-05-30 NOTE — Assessment & Plan Note (Addendum)
Chronic - ?etiology Pt refused Hematology ref and phlebotomy

## 2019-06-01 ENCOUNTER — Other Ambulatory Visit: Payer: Self-pay | Admitting: Internal Medicine

## 2019-06-02 NOTE — Progress Notes (Signed)
Addendum: Reviewed and agree with assessment and management plan. Chidubem Chaires M, MD  

## 2019-06-23 ENCOUNTER — Encounter: Payer: Medicare Other | Admitting: Internal Medicine

## 2019-07-05 ENCOUNTER — Ambulatory Visit (INDEPENDENT_AMBULATORY_CARE_PROVIDER_SITE_OTHER): Payer: Medicare Other | Admitting: Internal Medicine

## 2019-07-05 ENCOUNTER — Other Ambulatory Visit: Payer: Self-pay

## 2019-07-05 DIAGNOSIS — J069 Acute upper respiratory infection, unspecified: Secondary | ICD-10-CM | POA: Diagnosis not present

## 2019-07-05 MED ORDER — AZITHROMYCIN 250 MG PO TABS: ORAL_TABLET | ORAL | 0 refills | Status: DC

## 2019-07-05 MED ORDER — BENZONATATE 200 MG PO CAPS: 200.0000 mg | ORAL_CAPSULE | Freq: Three times a day (TID) | ORAL | 1 refills | Status: DC | PRN

## 2019-07-05 MED ORDER — FLUCONAZOLE 150 MG PO TABS: 150.0000 mg | ORAL_TABLET | Freq: Once | ORAL | 1 refills | Status: AC

## 2019-07-05 NOTE — Assessment & Plan Note (Signed)
Zpac Tessalon Diflucan  The pt declined COVID 19 test To UC/ER if worse

## 2019-07-05 NOTE — Progress Notes (Signed)
Virtual Visit via Telephone Note  I connected with Laura Vasquez on 07/05/19 at  3:00 PM EST by telephone and verified that I am speaking with the correct person using two identifiers.   I discussed the limitations, risks, security and privacy concerns of performing an evaluation and management service by telephone and the availability of in person appointments. I also discussed with the patient that there may be a patient responsible charge related to this service. The patient expressed understanding and agreed to proceed.   History of Present Illness:   C/o URI sx's of several days duration.  Laura Vasquez is complaining of nasal congestion, sinus pain, cough, sore throat.  There is no fever. Observations/Objective:  Her voice sounds normal.  No obvious dyspnea or hoarseness. Assessment and Plan:  See plan Follow Up Instructions:    I discussed the assessment and treatment plan with the patient. The patient was provided an opportunity to ask questions and all were answered. The patient agreed with the plan and demonstrated an understanding of the instructions.   The patient was advised to call back or seek an in-person evaluation if the symptoms worsen or if the condition fails to improve as anticipated.  I provided 12 minutes of non-face-to-face time during this encounter.   Walker Kehr, MD

## 2019-07-09 ENCOUNTER — Encounter: Payer: Self-pay | Admitting: Internal Medicine

## 2019-08-28 ENCOUNTER — Telehealth: Payer: Self-pay

## 2019-08-28 NOTE — Telephone Encounter (Signed)
Patient has some questions about her appointment on 08/31/2019 and would like for [facility] the triage nurse to call her back. Patient would not go into detail about what questions where regarding.

## 2019-08-28 NOTE — Telephone Encounter (Signed)
See message below °

## 2019-08-29 NOTE — Telephone Encounter (Signed)
Patient has questions about covid vaccine, and about recent lab work, needs to be rechecked---she is seeing dr Alain Marion on 1/14 and will discuss in that visit

## 2019-08-31 ENCOUNTER — Other Ambulatory Visit: Payer: Self-pay

## 2019-08-31 ENCOUNTER — Ambulatory Visit (INDEPENDENT_AMBULATORY_CARE_PROVIDER_SITE_OTHER): Payer: Medicare Other | Admitting: Internal Medicine

## 2019-08-31 ENCOUNTER — Encounter: Payer: Self-pay | Admitting: Internal Medicine

## 2019-08-31 VITALS — HR 99 | Temp 98.4°F | Ht 64.0 in | Wt 177.0 lb

## 2019-08-31 DIAGNOSIS — I10 Essential (primary) hypertension: Secondary | ICD-10-CM

## 2019-08-31 DIAGNOSIS — R739 Hyperglycemia, unspecified: Secondary | ICD-10-CM

## 2019-08-31 DIAGNOSIS — E785 Hyperlipidemia, unspecified: Secondary | ICD-10-CM

## 2019-08-31 DIAGNOSIS — D751 Secondary polycythemia: Secondary | ICD-10-CM

## 2019-08-31 DIAGNOSIS — F411 Generalized anxiety disorder: Secondary | ICD-10-CM

## 2019-08-31 DIAGNOSIS — E538 Deficiency of other specified B group vitamins: Secondary | ICD-10-CM | POA: Diagnosis not present

## 2019-08-31 LAB — CBC WITH DIFFERENTIAL/PLATELET
Basophils Absolute: 0 10*3/uL (ref 0.0–0.1)
Basophils Relative: 0.4 % (ref 0.0–3.0)
Eosinophils Absolute: 0.1 10*3/uL (ref 0.0–0.7)
Eosinophils Relative: 1.6 % (ref 0.0–5.0)
HCT: 45.6 % (ref 36.0–46.0)
Hemoglobin: 15 g/dL (ref 12.0–15.0)
Lymphocytes Relative: 27 % (ref 12.0–46.0)
Lymphs Abs: 1.1 10*3/uL (ref 0.7–4.0)
MCHC: 32.9 g/dL (ref 30.0–36.0)
MCV: 91 fl (ref 78.0–100.0)
Monocytes Absolute: 0.3 10*3/uL (ref 0.1–1.0)
Monocytes Relative: 7.7 % (ref 3.0–12.0)
Neutro Abs: 2.7 10*3/uL (ref 1.4–7.7)
Neutrophils Relative %: 63.3 % (ref 43.0–77.0)
Platelets: 238 10*3/uL (ref 150.0–400.0)
RBC: 5.02 Mil/uL (ref 3.87–5.11)
RDW: 15 % (ref 11.5–15.5)
WBC: 4.2 10*3/uL (ref 4.0–10.5)

## 2019-08-31 LAB — BASIC METABOLIC PANEL
BUN: 12 mg/dL (ref 6–23)
CO2: 28 mEq/L (ref 19–32)
Calcium: 9.8 mg/dL (ref 8.4–10.5)
Chloride: 104 mEq/L (ref 96–112)
Creatinine, Ser: 0.76 mg/dL (ref 0.40–1.20)
GFR: 74.07 mL/min (ref >60.00)
Glucose, Bld: 101 mg/dL — ABNORMAL HIGH (ref 70–99)
Potassium: 4 mEq/L (ref 3.5–5.1)
Sodium: 140 mEq/L (ref 135–145)

## 2019-08-31 LAB — HEPATIC FUNCTION PANEL
ALT: 23 U/L (ref 0–35)
AST: 27 U/L (ref 0–37)
Albumin: 4.6 g/dL (ref 3.5–5.2)
Alkaline Phosphatase: 37 U/L — ABNORMAL LOW (ref 39–117)
Bilirubin, Direct: 0.2 mg/dL (ref 0.0–0.3)
Total Bilirubin: 1 mg/dL (ref 0.2–1.2)
Total Protein: 7.5 g/dL (ref 6.0–8.3)

## 2019-08-31 LAB — HEMOGLOBIN A1C: Hgb A1c MFr Bld: 5.7 % (ref 4.6–6.5)

## 2019-08-31 NOTE — Assessment & Plan Note (Signed)
On B12 

## 2019-08-31 NOTE — Patient Instructions (Signed)
You can pre-medicate yourself with Benadryl 25 mg and Tylenol 650 mg 1 hour prior to the vaccination.  

## 2019-08-31 NOTE — Assessment & Plan Note (Signed)
Diazepam prn  Potential benefits of a long term benzodiazepines  use as well as potential risks  and complications were explained to the patient and were aknowledged.  Pt declined antidepressants

## 2019-08-31 NOTE — Assessment & Plan Note (Signed)
CBC

## 2019-08-31 NOTE — Assessment & Plan Note (Signed)
Pt declined additional Rx On Propranolol Cardiac cor calcium CT offered - declined

## 2019-08-31 NOTE — Progress Notes (Signed)
Subjective:  Patient ID: Laura Vasquez, female    DOB: 06-May-1946  Age: 74 y.o. MRN: [id number]  CC: No chief complaint on file.   HPI Laura Vasquez presents for HTN, anxiety, B12 def f/u  Outpatient Medications Prior to Visit  Medication Sig Dispense Refill  . aspirin EC 81 MG tablet Take 81 mg by mouth daily.    . B Complex Vitamins (B COMPLEX PO) Take 1 tablet by mouth daily as needed.     . calcium carbonate (TUMS - DOSED IN MG ELEMENTAL CALCIUM) 500 MG chewable tablet Chew 1 tablet by mouth as needed.     . cholecalciferol (VITAMIN D) 1000 UNITS tablet Take 2,000 Units by mouth daily.     . cyanocobalamin 500 MCG tablet Take 500 mcg by mouth daily as needed (Alternate b/t vitamin b complex).     . diazepam (VALIUM) 5 MG tablet Take 0.5-1 tablets (2.5-5 mg total) by mouth every 12 (twelve) hours as needed for anxiety. 60 tablet 5  . dicyclomine (BENTYL) 10 MG capsule Take 10 mg by mouth 2 (two) times daily.    Marland Kitchen MAGNESIUM PO Take 800 mg by mouth.     . meclizine (ANTIVERT) 25 MG tablet Take 1 tablet (25 mg total) by mouth 3 (three) times daily as needed for dizziness. (Patient taking differently: Take 25 mg by mouth as needed for dizziness. ) 30 tablet 1  . Na Sulfate-K Sulfate-Mg Sulf 17.5-3.13-1.6 GM/177ML SOLN Suprep-Use as directed 354 mL 0  . Omega-3 Fatty Acids (FISH OIL PO) Take 1-2 each by mouth daily as needed.     Marland Kitchen omeprazole (PRILOSEC) 20 MG capsule TAKE 1 CAPSULE(20 MG) BY MOUTH DAILY 30 capsule 11  . propranolol (INDERAL) 10 MG tablet take 1 tablet by mouth twice a day (Patient taking differently: Take 10 mg by mouth daily. ) 60 tablet 5  . Psyllium (METAMUCIL PO) Take by mouth daily. 1 tablespoon    . azithromycin (ZITHROMAX Z-PAK) 250 MG tablet As directed 6 tablet 0  . benzonatate (TESSALON) 200 MG capsule Take 1 capsule (200 mg total) by mouth 3 (three) times daily as needed for cough. 30 capsule 1   No facility-administered medications prior to visit.     ROS: Review of Systems  Constitutional: Negative for activity change, appetite change, chills, fatigue and unexpected weight change.  HENT: Negative for congestion, mouth sores and sinus pressure.   Eyes: Negative for visual disturbance.  Respiratory: Negative for cough and chest tightness.   Gastrointestinal: Negative for abdominal pain and nausea.  Genitourinary: Negative for difficulty urinating, frequency and vaginal pain.  Musculoskeletal: Negative for back pain and gait problem.  Skin: Negative for pallor and rash.  Neurological: Negative for dizziness, tremors, weakness, numbness and headaches.  Psychiatric/Behavioral: Negative for confusion, sleep disturbance and suicidal ideas. The patient is nervous/anxious.     Objective:  Pulse 99   Temp 98.4 F (36.9 C) (Oral)   Ht 5\' 4"  (1.626 m)   Wt 177 lb (80.3 kg)   SpO2 99%   BMI 30.38 kg/m   BP Readings from Last 3 Encounters:  05/26/19 135/78  10/19/18 (!) 150/94  11/23/17 (!) 142/92    Wt Readings from Last 3 Encounters:  08/31/19 177 lb (80.3 kg)  05/30/19 178 lb (80.7 kg)  05/26/19 178 lb 3.2 oz (80.8 kg)    Physical Exam Constitutional:      General: She is not in acute distress.    Appearance: She is  well-developed.  HENT:     Head: Normocephalic.     Right Ear: External ear normal.     Left Ear: External ear normal.     Nose: Nose normal.  Eyes:     General:        Right eye: No discharge.        Left eye: No discharge.     Conjunctiva/sclera: Conjunctivae normal.     Pupils: Pupils are equal, round, and reactive to light.  Neck:     Thyroid: No thyromegaly.     Vascular: No JVD.     Trachea: No tracheal deviation.  Cardiovascular:     Rate and Rhythm: Normal rate and regular rhythm.     Heart sounds: Normal heart sounds.  Pulmonary:     Effort: No respiratory distress.     Breath sounds: No stridor. No wheezing.  Abdominal:     General: Bowel sounds are normal. There is no distension.      Palpations: Abdomen is soft. There is no mass.     Tenderness: There is no abdominal tenderness. There is no guarding or rebound.  Musculoskeletal:        General: No tenderness.     Cervical back: Normal range of motion and neck supple.  Lymphadenopathy:     Cervical: No cervical adenopathy.  Skin:    Findings: No erythema or rash.  Neurological:     Cranial Nerves: No cranial nerve deficit.     Motor: No abnormal muscle tone.     Coordination: Coordination normal.     Deep Tendon Reflexes: Reflexes normal.  Psychiatric:        Behavior: Behavior normal.        Thought Content: Thought content normal.        Judgment: Judgment normal.     Lab Results  Component Value Date   WBC 10.4 05/24/2019   HGB 16.1 (H) 05/24/2019   HCT 47.6 (H) 05/24/2019   PLT 230.0 05/24/2019   GLUCOSE 110 (H) 05/24/2019   CHOL 246 (H) 05/24/2019   TRIG 96.0 05/24/2019   HDL 80.90 05/24/2019   LDLDIRECT 172.9 04/03/2013   LDLCALC 146 (H) 05/24/2019   ALT 24 05/24/2019   AST 24 05/24/2019   NA 139 05/24/2019   K 5.0 05/24/2019   CL 102 05/24/2019   CREATININE 0.82 05/24/2019   BUN 9 05/24/2019   CO2 26 05/24/2019   TSH 2.52 05/24/2019   MICROALBUR <0.7 05/21/2017    US Abdomen Complete  Result Date: 05/19/2019 CLINICAL DATA:  Abdominal plain.  History of cholecystectomy. EXAM: ABDOMEN ULTRASOUND COMPLETE COMPARISON:  Abdominal ultrasound 11/02/2008, CT abdomen pelvis 10/13/2013 FINDINGS: Gallbladder: Surgically absent Common bile duct: Diameter: 0.5 cm Liver: No focal lesion identified. The parenchymal echogenicity is diffusely increased. Portal vein is patent on color Doppler imaging with normal direction of blood flow towards the liver. IVC: No abnormality visualized. Pancreas: Poorly visualized. Spleen: Size and appearance within normal limits. Right Kidney: Length: 9.1 cm. Echogenicity within normal limits. No mass or hydronephrosis visualized. Left Kidney: Length: 10 cm. Echogenicity  within normal limits. No mass or hydronephrosis visualized. Abdominal aorta: No aneurysm visualized. Other findings: None. IMPRESSION: 1.  No sonographic finding to explain the patient's abdominal pain. 2. Diffusely increased liver echogenicity as can be seen in hepatic steatosis. Electronically Signed   By: Audie Pinto M.D.   On: 05/19/2019 09:25    Assessment & Plan:   There are no diagnoses linked to this encounter.  No orders of the defined types were placed in this encounter.    Follow-up: No follow-ups on file.  Walker Kehr, MD

## 2019-08-31 NOTE — Assessment & Plan Note (Signed)
She declined statins

## 2019-09-13 ENCOUNTER — Ambulatory Visit: Payer: Medicare Other

## 2019-09-21 ENCOUNTER — Ambulatory Visit: Payer: Medicare Other | Attending: Internal Medicine

## 2019-09-21 DIAGNOSIS — Z23 Encounter for immunization: Secondary | ICD-10-CM | POA: Insufficient documentation

## 2019-09-21 NOTE — Progress Notes (Signed)
   Covid-19 Vaccination Clinic  Name:  Laura Vasquez    MRN: [id number] DOB: November 22, 1945  09/21/2019  [name] was observed post Covid-19 immunization for 15 minutes without incidence. She was provided with Vaccine Information Sheet and instruction to access the V-Safe system.   [name] was instructed to call 911 with any severe reactions post vaccine: Marland Kitchen Difficulty breathing  . Swelling of your face and throat  . A fast heartbeat  . A bad rash all over your body  . Dizziness and weakness    Immunizations Administered    Name Date Dose VIS Date Route   Pfizer COVID-19 Vaccine 09/21/2019 12:07 PM 0.3 mL 07/28/2019 Intramuscular   Manufacturer: [place]   Lot: [id number]   [place]: [id number]

## 2019-10-11 ENCOUNTER — Telehealth: Payer: Self-pay | Admitting: Internal Medicine

## 2019-10-11 NOTE — Telephone Encounter (Signed)
Pt notified per OV notes "You can pre-medicate yourself with Benadryl 25 mg and Tylenol 650 mg 1 hour prior to the vaccination."

## 2019-10-11 NOTE — Telephone Encounter (Signed)
  Message from [facility]:  Initial Comment Caller states she  was seen 1 month ago. She had 1st vaccine (COVID) is awaiting 2nd shot. Looking for guidance on taking Tylenol before 2nd vaccine. Laura Vasquez Transaction Date/Time: 10/11/2019 7:57:25

## 2019-10-16 ENCOUNTER — Ambulatory Visit: Payer: Medicare Other | Attending: Internal Medicine

## 2019-10-16 DIAGNOSIS — Z23 Encounter for immunization: Secondary | ICD-10-CM | POA: Insufficient documentation

## 2019-10-16 NOTE — Progress Notes (Signed)
   Covid-19 Vaccination Clinic  Name:  Laura Vasquez    MRN: [id number] DOB: 06/03/1946  10/16/2019  [name] was observed post Covid-19 immunization for 15 minutes without incidence. She was provided with Vaccine Information Sheet and instruction to access the V-Safe system.   [name] was instructed to call 911 with any severe reactions post vaccine: Marland Kitchen Difficulty breathing  . Swelling of your face and throat  . A fast heartbeat  . A bad rash all over your body  . Dizziness and weakness    Immunizations Administered    Name Date Dose VIS Date Route   Pfizer COVID-19 Vaccine 10/16/2019  3:41 PM 0.3 mL 07/28/2019 Intramuscular   Manufacturer: [place]   Lot: [id number]   [place]: [id number]

## 2019-11-29 ENCOUNTER — Ambulatory Visit (INDEPENDENT_AMBULATORY_CARE_PROVIDER_SITE_OTHER): Payer: Medicare Other | Admitting: Internal Medicine

## 2019-11-29 ENCOUNTER — Encounter: Payer: Self-pay | Admitting: Internal Medicine

## 2019-11-29 ENCOUNTER — Other Ambulatory Visit: Payer: Self-pay

## 2019-11-29 DIAGNOSIS — R739 Hyperglycemia, unspecified: Secondary | ICD-10-CM | POA: Diagnosis not present

## 2019-11-29 DIAGNOSIS — E6609 Other obesity due to excess calories: Secondary | ICD-10-CM

## 2019-11-29 DIAGNOSIS — D582 Other hemoglobinopathies: Secondary | ICD-10-CM

## 2019-11-29 DIAGNOSIS — E538 Deficiency of other specified B group vitamins: Secondary | ICD-10-CM | POA: Diagnosis not present

## 2019-11-29 DIAGNOSIS — M79605 Pain in left leg: Secondary | ICD-10-CM

## 2019-11-29 DIAGNOSIS — Z6833 Body mass index (BMI) 33.0-33.9, adult: Secondary | ICD-10-CM

## 2019-11-29 DIAGNOSIS — I10 Essential (primary) hypertension: Secondary | ICD-10-CM

## 2019-11-29 DIAGNOSIS — D751 Secondary polycythemia: Secondary | ICD-10-CM

## 2019-11-29 LAB — BASIC METABOLIC PANEL
BUN: 12 mg/dL (ref 6–23)
CO2: 28 mEq/L (ref 19–32)
Calcium: 9.6 mg/dL (ref 8.4–10.5)
Chloride: 104 mEq/L (ref 96–112)
Creatinine, Ser: 0.87 mg/dL (ref 0.40–1.20)
GFR: 63.33 mL/min (ref >60.00)
Glucose, Bld: 106 mg/dL — ABNORMAL HIGH (ref 70–99)
Potassium: 4.2 mEq/L (ref 3.5–5.1)
Sodium: 139 mEq/L (ref 135–145)

## 2019-11-29 LAB — CBC WITH DIFFERENTIAL/PLATELET
Basophils Absolute: 0 10*3/uL (ref 0.0–0.1)
Basophils Relative: 0.4 % (ref 0.0–3.0)
Eosinophils Absolute: 0.1 10*3/uL (ref 0.0–0.7)
Eosinophils Relative: 1.5 % (ref 0.0–5.0)
HCT: 43.4 % (ref 36.0–46.0)
Hemoglobin: 14.4 g/dL (ref 12.0–15.0)
Lymphocytes Relative: 28.7 % (ref 12.0–46.0)
Lymphs Abs: 1.3 10*3/uL (ref 0.7–4.0)
MCHC: 33.2 g/dL (ref 30.0–36.0)
MCV: 91.8 fl (ref 78.0–100.0)
Monocytes Absolute: 0.4 10*3/uL (ref 0.1–1.0)
Monocytes Relative: 8.1 % (ref 3.0–12.0)
Neutro Abs: 2.8 10*3/uL (ref 1.4–7.7)
Neutrophils Relative %: 61.3 % (ref 43.0–77.0)
Platelets: 220 10*3/uL (ref 150.0–400.0)
RBC: 4.73 Mil/uL (ref 3.87–5.11)
RDW: 13.9 % (ref 11.5–15.5)
WBC: 4.6 10*3/uL (ref 4.0–10.5)

## 2019-11-29 LAB — URINALYSIS, ROUTINE W REFLEX MICROSCOPIC
Bilirubin Urine: NEGATIVE
Hgb urine dipstick: NEGATIVE
Ketones, ur: NEGATIVE
Nitrite: NEGATIVE
RBC / HPF: NONE SEEN (ref 0–?)
Specific Gravity, Urine: 1.02 (ref 1.000–1.030)
Total Protein, Urine: NEGATIVE
Urine Glucose: NEGATIVE
Urobilinogen, UA: 0.2 (ref 0.0–1.0)
pH: 6.5 (ref 5.0–8.0)

## 2019-11-29 LAB — D-DIMER, QUANTITATIVE: D-Dimer, Quant: 0.56 mcg/mL FEU — ABNORMAL HIGH (ref <0.50)

## 2019-11-29 LAB — HEMOGLOBIN A1C: Hgb A1c MFr Bld: 5.7 % (ref 4.6–6.5)

## 2019-11-29 NOTE — Patient Instructions (Signed)
Brix jar opener Silicon pads

## 2019-11-29 NOTE — Progress Notes (Signed)
Subjective:  Patient ID: Laura Vasquez, female    DOB: December 06, 1945  Age: 74 y.o. MRN: [id number]  CC: No chief complaint on file.   HPI Laura Vasquez [facility] presents for HTN, anxiety, IBS f/u  C/o OA  Pains Complains of fullness in the right axilla-chronic Complains of pain in the left inner thigh when working in the yard.  She is doing a lot of yard work daily. BP nl at home  Outpatient Medications Prior to Visit  Medication Sig Dispense Refill  . aspirin EC 81 MG tablet Take 81 mg by mouth daily.    . B Complex Vitamins (B COMPLEX PO) Take 1 tablet by mouth daily as needed.     . calcium carbonate (TUMS - DOSED IN MG ELEMENTAL CALCIUM) 500 MG chewable tablet Chew 1 tablet by mouth as needed.     . cholecalciferol (VITAMIN D) 1000 UNITS tablet Take 2,000 Units by mouth daily.     . cyanocobalamin 500 MCG tablet Take 500 mcg by mouth daily as needed (Alternate b/t vitamin b complex).     . diazepam (VALIUM) 5 MG tablet Take 0.5-1 tablets (2.5-5 mg total) by mouth every 12 (twelve) hours as needed for anxiety. 60 tablet 5  . dicyclomine (BENTYL) 10 MG capsule Take 10 mg by mouth 2 (two) times daily.    Marland Kitchen MAGNESIUM PO Take 800 mg by mouth.     . meclizine (ANTIVERT) 25 MG tablet Take 1 tablet (25 mg total) by mouth 3 (three) times daily as needed for dizziness. (Patient taking differently: Take 25 mg by mouth as needed for dizziness. ) 30 tablet 1  . Na Sulfate-K Sulfate-Mg Sulf 17.5-3.13-1.6 GM/177ML SOLN Suprep-Use as directed 354 mL 0  . Omega-3 Fatty Acids (FISH OIL PO) Take 1-2 each by mouth daily as needed.     Marland Kitchen omeprazole (PRILOSEC) 20 MG capsule TAKE 1 CAPSULE(20 MG) BY MOUTH DAILY 30 capsule 11  . propranolol (INDERAL) 10 MG tablet take 1 tablet by mouth twice a day (Patient taking differently: Take 10 mg by mouth daily. ) 60 tablet 5  . Psyllium (METAMUCIL PO) Take by mouth daily. 1 tablespoon     No facility-administered medications prior to visit.    ROS: Review of Systems   Constitutional: Negative for activity change, appetite change, chills, fatigue and unexpected weight change.  HENT: Negative for congestion, mouth sores and sinus pressure.   Eyes: Negative for visual disturbance.  Respiratory: Negative for cough and chest tightness.   Gastrointestinal: Negative for abdominal pain and nausea.  Genitourinary: Negative for difficulty urinating, frequency and vaginal pain.  Musculoskeletal: Positive for arthralgias. Negative for back pain and gait problem.  Skin: Negative for pallor and rash.  Neurological: Negative for dizziness, tremors, weakness, numbness and headaches.  Psychiatric/Behavioral: Negative for confusion and sleep disturbance. The patient is nervous/anxious.     Objective:  Pulse 78   Temp 98.2 F (36.8 C) (Oral)   Ht 5\' 4"  (1.626 m)   Wt 180 lb (81.6 kg)   SpO2 98%   BMI 30.90 kg/m   BP Readings from Last 3 Encounters:  05/26/19 135/78  10/19/18 (!) 150/94  11/23/17 (!) 142/92    Wt Readings from Last 3 Encounters:  11/29/19 180 lb (81.6 kg)  08/31/19 177 lb (80.3 kg)  05/30/19 178 lb (80.7 kg)    Physical Exam Constitutional:      General: She is not in acute distress.    Appearance: She is well-developed. She  is obese.  HENT:     Head: Normocephalic.     Right Ear: External ear normal.     Left Ear: External ear normal.     Nose: Nose normal.  Eyes:     General:        Right eye: No discharge.        Left eye: No discharge.     Conjunctiva/sclera: Conjunctivae normal.     Pupils: Pupils are equal, round, and reactive to light.  Neck:     Thyroid: No thyromegaly.     Vascular: No JVD.     Trachea: No tracheal deviation.  Cardiovascular:     Rate and Rhythm: Normal rate and regular rhythm.     Heart sounds: Normal heart sounds.  Pulmonary:     Effort: No respiratory distress.     Breath sounds: No stridor. No wheezing.  Abdominal:     General: Bowel sounds are normal. There is no distension.     Palpations:  Abdomen is soft. There is no mass.     Tenderness: There is no abdominal tenderness. There is no guarding or rebound.  Musculoskeletal:        General: Tenderness present.     Cervical back: Normal range of motion and neck supple.  Lymphadenopathy:     Cervical: No cervical adenopathy.  Skin:    Findings: No erythema or rash.  Neurological:     Cranial Nerves: No cranial nerve deficit.     Motor: No abnormal muscle tone.     Coordination: Coordination normal.     Deep Tendon Reflexes: Reflexes normal.  Psychiatric:        Behavior: Behavior normal.        Thought Content: Thought content normal.        Judgment: Judgment normal.   hands w/OA Underarm fat is slightly asymmetric with more fullness on the right.  No mass, no pain, no enlarged glands. Left inner thigh is slightly sensitive to palpation.  No masses or veins palpable.  No discoloration  Lab Results  Component Value Date   WBC 4.2 08/31/2019   HGB 15.0 08/31/2019   HCT 45.6 08/31/2019   PLT 238.0 08/31/2019   GLUCOSE 101 (H) 08/31/2019   CHOL 246 (H) 05/24/2019   TRIG 96.0 05/24/2019   HDL 80.90 05/24/2019   LDLDIRECT 172.9 04/03/2013   LDLCALC 146 (H) 05/24/2019   ALT 23 08/31/2019   AST 27 08/31/2019   NA 140 08/31/2019   K 4.0 08/31/2019   CL 104 08/31/2019   CREATININE 0.76 08/31/2019   BUN 12 08/31/2019   CO2 28 08/31/2019   TSH 2.52 05/24/2019   HGBA1C 5.7 08/31/2019   MICROALBUR <0.7 05/21/2017    US Abdomen Complete  Result Date: 05/19/2019 CLINICAL DATA:  Abdominal plain.  History of cholecystectomy. EXAM: ABDOMEN ULTRASOUND COMPLETE COMPARISON:  Abdominal ultrasound 11/02/2008, CT abdomen pelvis 10/13/2013 FINDINGS: Gallbladder: Surgically absent Common bile duct: Diameter: 0.5 cm Liver: No focal lesion identified. The parenchymal echogenicity is diffusely increased. Portal vein is patent on color Doppler imaging with normal direction of blood flow towards the liver. IVC: No abnormality  visualized. Pancreas: Poorly visualized. Spleen: Size and appearance within normal limits. Right Kidney: Length: 9.1 cm. Echogenicity within normal limits. No mass or hydronephrosis visualized. Left Kidney: Length: 10 cm. Echogenicity within normal limits. No mass or hydronephrosis visualized. Abdominal aorta: No aneurysm visualized. Other findings: None. IMPRESSION: 1.  No sonographic finding to explain the patient's abdominal pain. 2.  Diffusely increased liver echogenicity as can be seen in hepatic steatosis. Electronically Signed   By: Audie Pinto M.D.   On: 05/19/2019 09:25    Assessment & Plan:     Walker Kehr, MD

## 2019-11-29 NOTE — Assessment & Plan Note (Signed)
A1c

## 2019-11-29 NOTE — Assessment & Plan Note (Signed)
?  MSK D dimer

## 2019-11-29 NOTE — Assessment & Plan Note (Signed)
Propranolol

## 2019-11-29 NOTE — Assessment & Plan Note (Signed)
Pt refused Hematology ref and phlebotomy CBC

## 2019-11-29 NOTE — Assessment & Plan Note (Signed)
Wt Readings from Last 3 Encounters:  11/29/19 180 lb (81.6 kg)  08/31/19 177 lb (80.3 kg)  05/30/19 178 lb (80.7 kg)

## 2019-11-29 NOTE — Assessment & Plan Note (Signed)
CBC Pt declined w/up and treatment

## 2019-11-29 NOTE — Assessment & Plan Note (Signed)
On B12 

## 2019-11-29 NOTE — Addendum Note (Signed)
Addended by: Isaiah Serge D on: 11/29/2019 08:54 AM   Modules accepted: Orders

## 2019-12-05 ENCOUNTER — Other Ambulatory Visit: Payer: Self-pay | Admitting: Internal Medicine

## 2019-12-05 ENCOUNTER — Telehealth: Payer: Self-pay | Admitting: Internal Medicine

## 2019-12-05 DIAGNOSIS — M79605 Pain in left leg: Secondary | ICD-10-CM

## 2019-12-05 NOTE — Telephone Encounter (Signed)
Results mailed 

## 2019-12-05 NOTE — Telephone Encounter (Signed)
     Please mail copy of lab results to patient, per patient request

## 2019-12-06 ENCOUNTER — Ambulatory Visit (HOSPITAL_COMMUNITY)
Admission: RE | Admit: 2019-12-06 | Discharge: 2019-12-06 | Disposition: A | Discharge status: Home / Self Care | Payer: Medicare Other | Source: Ambulatory Visit | Attending: Cardiology | Admitting: Cardiology

## 2019-12-06 ENCOUNTER — Other Ambulatory Visit: Payer: Self-pay

## 2019-12-06 DIAGNOSIS — M79605 Pain in left leg: Secondary | ICD-10-CM | POA: Insufficient documentation

## 2020-02-28 ENCOUNTER — Ambulatory Visit: Payer: Medicare Other | Admitting: Internal Medicine

## 2020-04-01 ENCOUNTER — Encounter: Payer: Self-pay | Admitting: Internal Medicine

## 2020-04-01 ENCOUNTER — Other Ambulatory Visit: Payer: Self-pay

## 2020-04-01 ENCOUNTER — Ambulatory Visit (INDEPENDENT_AMBULATORY_CARE_PROVIDER_SITE_OTHER): Payer: Medicare Other | Admitting: Internal Medicine

## 2020-04-01 VITALS — HR 77 | Temp 98.3°F | Ht 64.0 in | Wt 180.0 lb

## 2020-04-01 DIAGNOSIS — R739 Hyperglycemia, unspecified: Secondary | ICD-10-CM

## 2020-04-01 DIAGNOSIS — N952 Postmenopausal atrophic vaginitis: Secondary | ICD-10-CM | POA: Insufficient documentation

## 2020-04-01 DIAGNOSIS — E785 Hyperlipidemia, unspecified: Secondary | ICD-10-CM | POA: Diagnosis not present

## 2020-04-01 DIAGNOSIS — D582 Other hemoglobinopathies: Secondary | ICD-10-CM | POA: Diagnosis not present

## 2020-04-01 DIAGNOSIS — R350 Frequency of micturition: Secondary | ICD-10-CM | POA: Insufficient documentation

## 2020-04-01 DIAGNOSIS — R030 Elevated blood-pressure reading, without diagnosis of hypertension: Secondary | ICD-10-CM

## 2020-04-01 DIAGNOSIS — N898 Other specified noninflammatory disorders of vagina: Secondary | ICD-10-CM | POA: Insufficient documentation

## 2020-04-01 DIAGNOSIS — M8580 Other specified disorders of bone density and structure, unspecified site: Secondary | ICD-10-CM | POA: Insufficient documentation

## 2020-04-01 DIAGNOSIS — D751 Secondary polycythemia: Secondary | ICD-10-CM | POA: Diagnosis not present

## 2020-04-01 MED ORDER — OMEPRAZOLE 20 MG PO CPDR: DELAYED_RELEASE_CAPSULE | ORAL | 11 refills | Status: DC

## 2020-04-01 NOTE — Assessment & Plan Note (Signed)
A1c

## 2020-04-01 NOTE — Assessment & Plan Note (Signed)
Labs On diet

## 2020-04-01 NOTE — Assessment & Plan Note (Signed)
CBC

## 2020-04-01 NOTE — Progress Notes (Signed)
Subjective:  Patient ID: Laura Vasquez, female    DOB: 18-Feb-1946  Age: 74 y.o. MRN: [id number]  CC: No chief complaint on file.   HPI Laura Vasquez [facility] presents for GERD, anxiety, elevated BP f/u  Outpatient Medications Prior to Visit  Medication Sig Dispense Refill  . aspirin EC 81 MG tablet Take 81 mg by mouth daily.    . B Complex Vitamins (B COMPLEX PO) Take 1 tablet by mouth daily as needed.     . calcium carbonate (TUMS - DOSED IN MG ELEMENTAL CALCIUM) 500 MG chewable tablet Chew 1 tablet by mouth as needed.     . cholecalciferol (VITAMIN D) 1000 UNITS tablet Take 2,000 Units by mouth daily.     . cyanocobalamin 500 MCG tablet Take 500 mcg by mouth daily as needed (Alternate b/t vitamin b complex).     . diazepam (VALIUM) 5 MG tablet Take 0.5-1 tablets (2.5-5 mg total) by mouth every 12 (twelve) hours as needed for anxiety. 60 tablet 5  . dicyclomine (BENTYL) 10 MG capsule Take 10 mg by mouth 2 (two) times daily.    Marland Kitchen MAGNESIUM PO Take 800 mg by mouth.     . meclizine (ANTIVERT) 25 MG tablet Take 1 tablet (25 mg total) by mouth 3 (three) times daily as needed for dizziness. (Patient taking differently: Take 25 mg by mouth as needed for dizziness. ) 30 tablet 1  . Na Sulfate-K Sulfate-Mg Sulf 17.5-3.13-1.6 GM/177ML SOLN Suprep-Use as directed 354 mL 0  . Omega-3 Fatty Acids (FISH OIL PO) Take 1-2 each by mouth daily as needed.     Marland Kitchen omeprazole (PRILOSEC) 20 MG capsule TAKE 1 CAPSULE(20 MG) BY MOUTH DAILY 30 capsule 11  . propranolol (INDERAL) 10 MG tablet take 1 tablet by mouth twice a day (Patient taking differently: Take 10 mg by mouth daily. ) 60 tablet 5  . Psyllium (METAMUCIL PO) Take by mouth daily. 1 tablespoon     No facility-administered medications prior to visit.    ROS: Review of Systems  Constitutional: Negative for activity change, appetite change, chills, fatigue and unexpected weight change.  HENT: Negative for congestion, mouth sores and sinus pressure.   Eyes:  Negative for visual disturbance.  Respiratory: Negative for cough and chest tightness.   Gastrointestinal: Negative for abdominal pain and nausea.  Genitourinary: Negative for difficulty urinating, frequency and vaginal pain.  Musculoskeletal: Negative for back pain and gait problem.  Skin: Negative for pallor and rash.  Neurological: Negative for dizziness, tremors, weakness, numbness and headaches.  Psychiatric/Behavioral: Negative for confusion and sleep disturbance. The patient is nervous/anxious.     Objective:  Pulse 77   Temp 98.3 F (36.8 C) (Oral)   Ht 5\' 4"  (1.626 m)   Wt 180 lb (81.6 kg)   SpO2 99%   BMI 30.90 kg/m   BP Readings from Last 3 Encounters:  05/26/19 135/78  10/19/18 (!) 150/94  11/23/17 (!) 142/92    Wt Readings from Last 3 Encounters:  04/01/20 180 lb (81.6 kg)  11/29/19 180 lb (81.6 kg)  08/31/19 177 lb (80.3 kg)    Physical Exam Constitutional:      General: She is not in acute distress.    Appearance: She is well-developed.  HENT:     Head: Normocephalic.     Right Ear: External ear normal.     Left Ear: External ear normal.     Nose: Nose normal.  Eyes:     General:  Right eye: No discharge.        Left eye: No discharge.     Conjunctiva/sclera: Conjunctivae normal.     Pupils: Pupils are equal, round, and reactive to light.  Neck:     Thyroid: No thyromegaly.     Vascular: No JVD.     Trachea: No tracheal deviation.  Cardiovascular:     Rate and Rhythm: Normal rate and regular rhythm.     Heart sounds: Normal heart sounds.  Pulmonary:     Effort: No respiratory distress.     Breath sounds: No stridor. No wheezing.  Abdominal:     General: Bowel sounds are normal. There is no distension.     Palpations: Abdomen is soft. There is no mass.     Tenderness: There is no abdominal tenderness. There is no guarding or rebound.  Musculoskeletal:        General: No tenderness.     Cervical back: Normal range of motion and neck  supple.  Lymphadenopathy:     Cervical: No cervical adenopathy.  Skin:    Findings: No erythema or rash.  Neurological:     Cranial Nerves: No cranial nerve deficit.     Motor: No abnormal muscle tone.     Coordination: Coordination normal.     Deep Tendon Reflexes: Reflexes normal.  Psychiatric:        Behavior: Behavior normal.        Thought Content: Thought content normal.        Judgment: Judgment normal.     Lab Results  Component Value Date   WBC 4.6 11/29/2019   HGB 14.4 11/29/2019   HCT 43.4 11/29/2019   PLT 220.0 11/29/2019   GLUCOSE 106 (H) 11/29/2019   CHOL 246 (H) 05/24/2019   TRIG 96.0 05/24/2019   HDL 80.90 05/24/2019   LDLDIRECT 172.9 04/03/2013   LDLCALC 146 (H) 05/24/2019   ALT 23 08/31/2019   AST 27 08/31/2019   NA 139 11/29/2019   K 4.2 11/29/2019   CL 104 11/29/2019   CREATININE 0.87 11/29/2019   BUN 12 11/29/2019   CO2 28 11/29/2019   TSH 2.52 05/24/2019   HGBA1C 5.7 11/29/2019   MICROALBUR <0.7 05/21/2017    VAS [place] LOWER EXTREMITY VENOUS (DVT)  Result Date: 12/06/2019  Lower Venous DVTStudy Indications: Pain. Other Indications: Patient complains of left leg pain for several months. She                    states that it does not hurt everyday. She denies swelling in                    her leg and no shortness of breath. Risk Factors: None identified. Performing Technologist: Wilkie Aye RVT  Examination Guidelines: A complete evaluation includes B-mode imaging, spectral Doppler, color Doppler, and power Doppler as needed of all accessible portions of each vessel. Bilateral testing is considered an integral part of a complete examination. Limited examinations for reoccurring indications may be performed as noted. The reflux portion of the exam is performed with the patient in reverse Trendelenburg.  +-----+---------------+---------+-----------+----------+--------------+ RIGHTCompressibilityPhasicitySpontaneityPropertiesThrombus Aging  +-----+---------------+---------+-----------+----------+--------------+ CFV  Full           Yes      Yes                                 +-----+---------------+---------+-----------+----------+--------------+  +---------+---------------+---------+-----------+----------+--------------+ LEFT  CompressibilityPhasicitySpontaneityPropertiesThrombus Aging +---------+---------------+---------+-----------+----------+--------------+ CFV      Full           Yes      Yes                                 +---------+---------------+---------+-----------+----------+--------------+ SFJ      Full           Yes      Yes                                 +---------+---------------+---------+-----------+----------+--------------+ FV Prox  Full           Yes      Yes                                 +---------+---------------+---------+-----------+----------+--------------+ FV Mid   Full           Yes      Yes                                 +---------+---------------+---------+-----------+----------+--------------+ FV DistalFull           Yes      Yes                                 +---------+---------------+---------+-----------+----------+--------------+ PFV      Full                                                        +---------+---------------+---------+-----------+----------+--------------+ POP      Full           Yes      Yes                                 +---------+---------------+---------+-----------+----------+--------------+ PTV      Full           Yes      Yes                                 +---------+---------------+---------+-----------+----------+--------------+ PERO     Full           Yes      Yes                                 +---------+---------------+---------+-----------+----------+--------------+ Gastroc  Full                                                         +---------+---------------+---------+-----------+----------+--------------+ GSV      Full           Yes      Yes                                 +---------+---------------+---------+-----------+----------+--------------+  Summary: RIGHT: - No evidence of common femoral vein obstruction.  LEFT: - No evidence of deep vein thrombosis in the lower extremity. No indirect evidence of obstruction proximal to the inguinal ligament. - No cystic structure found in the popliteal fossa.  *See table(s) above for measurements and observations. Electronically signed by Ida Rogue MD on 12/06/2019 at 2:27:27 PM.    Final     Assessment & Plan:   Walker Kehr, MD

## 2020-04-01 NOTE — Assessment & Plan Note (Signed)
BP OK at home

## 2020-05-27 ENCOUNTER — Other Ambulatory Visit: Payer: Self-pay

## 2020-05-27 ENCOUNTER — Ambulatory Visit (INDEPENDENT_AMBULATORY_CARE_PROVIDER_SITE_OTHER): Payer: Medicare Other | Admitting: *Deleted

## 2020-05-27 DIAGNOSIS — Z23 Encounter for immunization: Secondary | ICD-10-CM | POA: Diagnosis not present

## 2020-06-11 ENCOUNTER — Other Ambulatory Visit: Payer: Self-pay | Admitting: Internal Medicine

## 2020-06-15 ENCOUNTER — Other Ambulatory Visit: Payer: Self-pay

## 2020-06-15 ENCOUNTER — Ambulatory Visit: Payer: Medicare Other | Attending: Internal Medicine

## 2020-06-15 DIAGNOSIS — Z23 Encounter for immunization: Secondary | ICD-10-CM

## 2020-06-15 NOTE — Progress Notes (Signed)
   Covid-19 Vaccination Clinic  Name:  Laura Vasquez    MRN: [id number] DOB: 1946/07/06  06/15/2020  [name] was observed post Covid-19 immunization for 15 minutes without incident. She was provided with Vaccine Information Sheet and instruction to access the V-Safe system.   [name] was instructed to call 911 with any severe reactions post vaccine: Marland Kitchen Difficulty breathing  . Swelling of face and throat  . A fast heartbeat  . A bad rash all over body  . Dizziness and weakness

## 2020-07-29 ENCOUNTER — Other Ambulatory Visit: Payer: Self-pay

## 2020-07-29 ENCOUNTER — Other Ambulatory Visit (INDEPENDENT_AMBULATORY_CARE_PROVIDER_SITE_OTHER): Payer: Medicare Other

## 2020-07-29 DIAGNOSIS — D582 Other hemoglobinopathies: Secondary | ICD-10-CM

## 2020-07-29 DIAGNOSIS — R739 Hyperglycemia, unspecified: Secondary | ICD-10-CM | POA: Diagnosis not present

## 2020-07-29 DIAGNOSIS — E785 Hyperlipidemia, unspecified: Secondary | ICD-10-CM

## 2020-07-29 DIAGNOSIS — D751 Secondary polycythemia: Secondary | ICD-10-CM

## 2020-07-29 LAB — URINALYSIS
Bilirubin Urine: NEGATIVE
Hgb urine dipstick: NEGATIVE
Ketones, ur: NEGATIVE
Leukocytes,Ua: NEGATIVE
Nitrite: NEGATIVE
Specific Gravity, Urine: 1.01 (ref 1.000–1.030)
Total Protein, Urine: NEGATIVE
Urine Glucose: NEGATIVE
Urobilinogen, UA: 0.2 (ref 0.0–1.0)
pH: 7 (ref 5.0–8.0)

## 2020-07-29 LAB — LIPID PANEL
Cholesterol: 248 mg/dL — ABNORMAL HIGH (ref 0–200)
HDL: 99 mg/dL (ref >39.00)
LDL Cholesterol: 130 mg/dL — ABNORMAL HIGH (ref 0–99)
NonHDL: 148.82
Total CHOL/HDL Ratio: 3
Triglycerides: 93 mg/dL (ref 0.0–149.0)
VLDL: 18.6 mg/dL (ref 0.0–40.0)

## 2020-07-29 LAB — CBC WITH DIFFERENTIAL/PLATELET
Basophils Absolute: 0 10*3/uL (ref 0.0–0.1)
Basophils Relative: 0.4 % (ref 0.0–3.0)
Eosinophils Absolute: 0.1 10*3/uL (ref 0.0–0.7)
Eosinophils Relative: 1.3 % (ref 0.0–5.0)
HCT: 44.3 % (ref 36.0–46.0)
Hemoglobin: 14.7 g/dL (ref 12.0–15.0)
Lymphocytes Relative: 30.8 % (ref 12.0–46.0)
Lymphs Abs: 1.4 10*3/uL (ref 0.7–4.0)
MCHC: 33.2 g/dL (ref 30.0–36.0)
MCV: 91.3 fl (ref 78.0–100.0)
Monocytes Absolute: 0.3 10*3/uL (ref 0.1–1.0)
Monocytes Relative: 7.2 % (ref 3.0–12.0)
Neutro Abs: 2.8 10*3/uL (ref 1.4–7.7)
Neutrophils Relative %: 60.3 % (ref 43.0–77.0)
Platelets: 221 10*3/uL (ref 150.0–400.0)
RBC: 4.85 Mil/uL (ref 3.87–5.11)
RDW: 14 % (ref 11.5–15.5)
WBC: 4.7 10*3/uL (ref 4.0–10.5)

## 2020-07-29 LAB — COMPREHENSIVE METABOLIC PANEL
ALT: 22 U/L (ref 0–35)
AST: 24 U/L (ref 0–37)
Albumin: 4.4 g/dL (ref 3.5–5.2)
Alkaline Phosphatase: 38 U/L — ABNORMAL LOW (ref 39–117)
BUN: 15 mg/dL (ref 6–23)
CO2: 28 mEq/L (ref 19–32)
Calcium: 9.8 mg/dL (ref 8.4–10.5)
Chloride: 103 mEq/L (ref 96–112)
Creatinine, Ser: 0.82 mg/dL (ref 0.40–1.20)
GFR: 69.45 mL/min (ref >60.00)
Glucose, Bld: 93 mg/dL (ref 70–99)
Potassium: 4 mEq/L (ref 3.5–5.1)
Sodium: 139 mEq/L (ref 135–145)
Total Bilirubin: 0.9 mg/dL (ref 0.2–1.2)
Total Protein: 7.4 g/dL (ref 6.0–8.3)

## 2020-07-29 LAB — TSH: TSH: 3.32 u[IU]/mL (ref 0.35–4.50)

## 2020-07-29 LAB — HEMOGLOBIN A1C: Hgb A1c MFr Bld: 5.8 % (ref 4.6–6.5)

## 2020-07-29 NOTE — Addendum Note (Signed)
Addended by: Trenda Moots on: 58/31/6742 07:55 AM   Modules accepted: Orders

## 2020-08-01 ENCOUNTER — Encounter: Payer: Self-pay | Admitting: Internal Medicine

## 2020-08-01 ENCOUNTER — Ambulatory Visit (INDEPENDENT_AMBULATORY_CARE_PROVIDER_SITE_OTHER): Payer: Medicare Other | Admitting: Internal Medicine

## 2020-08-01 ENCOUNTER — Other Ambulatory Visit: Payer: Self-pay

## 2020-08-01 DIAGNOSIS — R4589 Other symptoms and signs involving emotional state: Secondary | ICD-10-CM | POA: Insufficient documentation

## 2020-08-01 DIAGNOSIS — E538 Deficiency of other specified B group vitamins: Secondary | ICD-10-CM | POA: Diagnosis not present

## 2020-08-01 DIAGNOSIS — M797 Fibromyalgia: Secondary | ICD-10-CM

## 2020-08-01 DIAGNOSIS — D751 Secondary polycythemia: Secondary | ICD-10-CM

## 2020-08-01 DIAGNOSIS — K219 Gastro-esophageal reflux disease without esophagitis: Secondary | ICD-10-CM

## 2020-08-01 DIAGNOSIS — E785 Hyperlipidemia, unspecified: Secondary | ICD-10-CM | POA: Diagnosis not present

## 2020-08-01 DIAGNOSIS — I10 Essential (primary) hypertension: Secondary | ICD-10-CM | POA: Diagnosis not present

## 2020-08-01 MED ORDER — DICYCLOMINE HCL 10 MG PO CAPS: 10.0000 mg | ORAL_CAPSULE | Freq: Two times a day (BID) | ORAL | 5 refills | Status: DC | PRN

## 2020-08-01 NOTE — Patient Instructions (Signed)
   B-complex with Niacin 100 mg    Lion's mane  

## 2020-08-01 NOTE — Assessment & Plan Note (Signed)
Pt declined statins 

## 2020-08-01 NOTE — Assessment & Plan Note (Addendum)
BP Readings from Last 3 Encounters:  05/26/19 135/78  10/19/18 (!) 150/94  11/23/17 (!) 142/92   Blood pressure seems to be doing better.  Continue with propranolol.  Obtain lab work including CBC and c-Met

## 2020-08-01 NOTE — Progress Notes (Signed)
Subjective:  Patient ID: Laura Vasquez, female    DOB: 20-Feb-1946  Age: 74 y.o. MRN: [id number]  CC: Follow-up (4 month f/u)   HPI [name] presents for HTN, FMS, GERD f/u  Outpatient Medications Prior to Visit  Medication Sig Dispense Refill  . aspirin EC 81 MG tablet Take 81 mg by mouth daily.    . B Complex Vitamins (B COMPLEX PO) Take 1 tablet by mouth daily as needed.     . calcium carbonate (TUMS - DOSED IN MG ELEMENTAL CALCIUM) 500 MG chewable tablet Chew 1 tablet by mouth as needed.     . cholecalciferol (VITAMIN D) 1000 UNITS tablet Take 2,000 Units by mouth daily.    . cyanocobalamin 500 MCG tablet Take 500 mcg by mouth daily as needed (Alternate b/t vitamin b complex).     . diazepam (VALIUM) 5 MG tablet Take 0.5-1 tablets (2.5-5 mg total) by mouth every 12 (twelve) hours as needed for anxiety. 60 tablet 5  . dicyclomine (BENTYL) 10 MG capsule Take 10 mg by mouth 2 (two) times daily.    Marland Kitchen MAGNESIUM PO Take 800 mg by mouth.    . meclizine (ANTIVERT) 25 MG tablet Take 1 tablet (25 mg total) by mouth 3 (three) times daily as needed for dizziness. (Patient taking differently: Take 25 mg by mouth as needed for dizziness.) 30 tablet 1  . Omega-3 Fatty Acids (FISH OIL PO) Take 1-2 each by mouth daily as needed.    Marland Kitchen omeprazole (PRILOSEC) 20 MG capsule TAKE 1 CAPSULE BY MOUTH EVERY DAY 30 capsule 11  . propranolol (INDERAL) 10 MG tablet take 1 tablet by mouth twice a day (Patient taking differently: Take 10 mg by mouth daily.) 60 tablet 5  . Psyllium (METAMUCIL PO) Take by mouth daily. 1 tablespoon    . Na Sulfate-K Sulfate-Mg Sulf 17.5-3.13-1.6 GM/177ML SOLN Suprep-Use as directed (Patient not taking: Reported on 08/01/2020) 354 mL 0   No facility-administered medications prior to visit.    ROS: Review of Systems  Constitutional: Negative for activity change, appetite change, chills, fatigue and unexpected weight change.  HENT: Negative for congestion, mouth sores and sinus  pressure.   Eyes: Negative for visual disturbance.  Respiratory: Negative for cough and chest tightness.   Gastrointestinal: Negative for abdominal pain and nausea.  Genitourinary: Negative for difficulty urinating, frequency and vaginal pain.  Musculoskeletal: Positive for arthralgias and myalgias. Negative for back pain and gait problem.  Skin: Negative for pallor and rash.  Neurological: Negative for dizziness, tremors, weakness, numbness and headaches.  Psychiatric/Behavioral: Positive for sleep disturbance. Negative for confusion. The patient is nervous/anxious.     Objective:  Pulse 70   Temp 98.2 F (36.8 C) (Oral)   Wt 182 lb 6.4 oz (82.7 kg)   BMI 31.31 kg/m   BP Readings from Last 3 Encounters:  05/26/19 135/78  10/19/18 (!) 150/94  11/23/17 (!) 142/92    Wt Readings from Last 3 Encounters:  08/01/20 182 lb 6.4 oz (82.7 kg)  04/01/20 180 lb (81.6 kg)  11/29/19 180 lb (81.6 kg)    Physical Exam Constitutional:      General: She is not in acute distress.    Appearance: She is well-developed.  HENT:     Head: Normocephalic.     Right Ear: External ear normal.     Left Ear: External ear normal.     Nose: Nose normal.     Mouth/Throat:     Mouth: Oropharynx  is clear and moist.  Eyes:     General:        Right eye: No discharge.        Left eye: No discharge.     Conjunctiva/sclera: Conjunctivae normal.     Pupils: Pupils are equal, round, and reactive to light.  Neck:     Thyroid: No thyromegaly.     Vascular: No JVD.     Trachea: No tracheal deviation.  Cardiovascular:     Rate and Rhythm: Normal rate and regular rhythm.     Heart sounds: Normal heart sounds.  Pulmonary:     Effort: No respiratory distress.     Breath sounds: No stridor. No wheezing.  Abdominal:     General: Bowel sounds are normal. There is no distension.     Palpations: Abdomen is soft. There is no mass.     Tenderness: There is no abdominal tenderness. There is no guarding or  rebound.  Musculoskeletal:        General: No tenderness or edema.     Cervical back: Normal range of motion and neck supple.  Lymphadenopathy:     Cervical: No cervical adenopathy.  Skin:    Findings: No erythema or rash.  Neurological:     Cranial Nerves: No cranial nerve deficit.     Motor: No abnormal muscle tone.     Coordination: Coordination normal.     Deep Tendon Reflexes: Reflexes normal.  Psychiatric:        Mood and Affect: Mood and affect normal.        Behavior: Behavior normal.        Thought Content: Thought content normal.        Judgment: Judgment normal.     Lab Results  Component Value Date   WBC 4.7 07/29/2020   HGB 14.7 07/29/2020   HCT 44.3 07/29/2020   PLT 221.0 07/29/2020   GLUCOSE 93 07/29/2020   CHOL 248 (H) 07/29/2020   TRIG 93.0 07/29/2020   HDL 99.00 07/29/2020   LDLDIRECT 172.9 04/03/2013   LDLCALC 130 (H) 07/29/2020   ALT 22 07/29/2020   AST 24 07/29/2020   NA 139 07/29/2020   K 4.0 07/29/2020   CL 103 07/29/2020   CREATININE 0.82 07/29/2020   BUN 15 07/29/2020   CO2 28 07/29/2020   TSH 3.32 07/29/2020   HGBA1C 5.8 07/29/2020   MICROALBUR <0.7 05/21/2017    VAS [place] LOWER EXTREMITY VENOUS (DVT)  Result Date: 12/06/2019  Lower Venous DVTStudy Indications: Pain. Other Indications: Patient complains of left leg pain for several months. She                    states that it does not hurt everyday. She denies swelling in                    her leg and no shortness of breath. Risk Factors: None identified. Performing Technologist: Wilkie Aye RVT  Examination Guidelines: A complete evaluation includes B-mode imaging, spectral Doppler, color Doppler, and power Doppler as needed of all accessible portions of each vessel. Bilateral testing is considered an integral part of a complete examination. Limited examinations for reoccurring indications may be performed as noted. The reflux portion of the exam is performed with the patient in reverse  Trendelenburg.  +-----+---------------+---------+-----------+----------+--------------+ RIGHTCompressibilityPhasicitySpontaneityPropertiesThrombus Aging +-----+---------------+---------+-----------+----------+--------------+ CFV  Full           Yes      Yes                                 +-----+---------------+---------+-----------+----------+--------------+  +---------+---------------+---------+-----------+----------+--------------+  LEFT     CompressibilityPhasicitySpontaneityPropertiesThrombus Aging +---------+---------------+---------+-----------+----------+--------------+ CFV      Full           Yes      Yes                                 +---------+---------------+---------+-----------+----------+--------------+ SFJ      Full           Yes      Yes                                 +---------+---------------+---------+-----------+----------+--------------+ FV Prox  Full           Yes      Yes                                 +---------+---------------+---------+-----------+----------+--------------+ FV Mid   Full           Yes      Yes                                 +---------+---------------+---------+-----------+----------+--------------+ FV DistalFull           Yes      Yes                                 +---------+---------------+---------+-----------+----------+--------------+ PFV      Full                                                        +---------+---------------+---------+-----------+----------+--------------+ POP      Full           Yes      Yes                                 +---------+---------------+---------+-----------+----------+--------------+ PTV      Full           Yes      Yes                                 +---------+---------------+---------+-----------+----------+--------------+ PERO     Full           Yes      Yes                                  +---------+---------------+---------+-----------+----------+--------------+ Gastroc  Full                                                        +---------+---------------+---------+-----------+----------+--------------+ GSV      Full           Yes      Yes                                 +---------+---------------+---------+-----------+----------+--------------+  Summary: RIGHT: - No evidence of common femoral vein obstruction.  LEFT: - No evidence of deep vein thrombosis in the lower extremity. No indirect evidence of obstruction proximal to the inguinal ligament. - No cystic structure found in the popliteal fossa.  *See table(s) above for measurements and observations. Electronically signed by Ida Rogue MD on 12/06/2019 at 2:27:27 PM.    Final     Assessment & Plan:    Walker Kehr, MD

## 2020-08-01 NOTE — Assessment & Plan Note (Addendum)
Unchanged.  Continue Vit D. Try lion's mane, B complex, Tylenol prn

## 2020-08-01 NOTE — Assessment & Plan Note (Addendum)
On vitamin B12.  Obtain B12 level periodically

## 2020-08-01 NOTE — Assessment & Plan Note (Signed)
  On diet  

## 2020-08-01 NOTE — Assessment & Plan Note (Signed)
Mild.

## 2020-08-01 NOTE — Assessment & Plan Note (Addendum)
Etiology remains unclear.  Obtain CBC

## 2020-08-04 NOTE — Assessment & Plan Note (Signed)
Unchanged.  Continue with omeprazole.

## 2020-08-15 ENCOUNTER — Encounter (HOSPITAL_COMMUNITY): Payer: Self-pay | Admitting: Emergency Medicine

## 2020-08-15 ENCOUNTER — Ambulatory Visit (HOSPITAL_COMMUNITY)
Admission: EM | Admit: 2020-08-15 | Discharge: 2020-08-15 | Disposition: A | Discharge status: Home / Self Care | Payer: Medicare Other | Attending: Urgent Care | Admitting: Urgent Care

## 2020-08-15 ENCOUNTER — Other Ambulatory Visit: Payer: Self-pay

## 2020-08-15 DIAGNOSIS — I493 Ventricular premature depolarization: Secondary | ICD-10-CM | POA: Diagnosis not present

## 2020-08-15 DIAGNOSIS — K219 Gastro-esophageal reflux disease without esophagitis: Secondary | ICD-10-CM

## 2020-08-15 DIAGNOSIS — I10 Essential (primary) hypertension: Secondary | ICD-10-CM

## 2020-08-15 DIAGNOSIS — R030 Elevated blood-pressure reading, without diagnosis of hypertension: Secondary | ICD-10-CM

## 2020-08-15 DIAGNOSIS — R9431 Abnormal electrocardiogram [ECG] [EKG]: Secondary | ICD-10-CM

## 2020-08-15 NOTE — ED Provider Notes (Signed)
[facility]   MRN: [id number] DOB: 10/23/1945  Subjective:   Laura Vasquez is a 74 y.o. female presenting for consultation regarding fluctuating blood pressures, having an episode of heartburn 2 nights ago.  Patient denies any active chest pain since the last episode of heartburn 2 nights ago.  She does have a history of GERD and responds well to her medications.  She also has a history of hypertension, mitral valve prolapse, diastolic dysfunction.  She has never had to see a cardiologist.  Also admits history of palpitations and anxiety, states that she is supposed to be on propranolol but has not taken this medication as prescribed.  She recently had a panel of labs done with her PCP but is supposed to follow-up this upcoming week.  Denies headache, confusion, chest pain, belly pain, weakness.  Denies history of MI, CAD.  Reports significant whitecoat syndrome.  No current facility-administered medications for this encounter.  Current Outpatient Medications:  .  aspirin EC 81 MG tablet, Take 81 mg by mouth daily., Disp: , Rfl:  .  B Complex Vitamins (B COMPLEX PO), Take 1 tablet by mouth daily as needed. , Disp: , Rfl:  .  calcium carbonate (TUMS - DOSED IN MG ELEMENTAL CALCIUM) 500 MG chewable tablet, Chew 1 tablet by mouth as needed. , Disp: , Rfl:  .  cholecalciferol (VITAMIN D) 1000 UNITS tablet, Take 2,000 Units by mouth daily., Disp: , Rfl:  .  cyanocobalamin 500 MCG tablet, Take 500 mcg by mouth daily as needed (Alternate b/t vitamin b complex). , Disp: , Rfl:  .  diazepam (VALIUM) 5 MG tablet, Take 0.5-1 tablets (2.5-5 mg total) by mouth every 12 (twelve) hours as needed for anxiety., Disp: 60 tablet, Rfl: 5 .  dicyclomine (BENTYL) 10 MG capsule, Take 1 capsule (10 mg total) by mouth 2 (two) times daily as needed for spasms., Disp: 60 capsule, Rfl: 5 .  MAGNESIUM PO, Take 800 mg by mouth., Disp: , Rfl:  .  meclizine (ANTIVERT) 25 MG tablet, Take 1 tablet (25 mg  total) by mouth 3 (three) times daily as needed for dizziness. (Patient taking differently: Take 25 mg by mouth as needed for dizziness.), Disp: 30 tablet, Rfl: 1 .  Na Sulfate-K Sulfate-Mg Sulf 17.5-3.13-1.6 GM/177ML SOLN, Suprep-Use as directed (Patient not taking: Reported on 08/01/2020), Disp: 354 mL, Rfl: 0 .  Omega-3 Fatty Acids (FISH OIL PO), Take 1-2 each by mouth daily as needed., Disp: , Rfl:  .  omeprazole (PRILOSEC) 20 MG capsule, TAKE 1 CAPSULE BY MOUTH EVERY DAY, Disp: 30 capsule, Rfl: 11 .  propranolol (INDERAL) 10 MG tablet, take 1 tablet by mouth twice a day (Patient taking differently: Take 10 mg by mouth daily.), Disp: 60 tablet, Rfl: 5 .  Psyllium (METAMUCIL PO), Take by mouth daily. 1 tablespoon, Disp: , Rfl:    Allergies  Allergen Reactions  . Amoxicillin-Pot Clavulanate Nausea And Vomiting    Can take plain amoxicillin ok  . Ceftin [Cefuroxime Axetil]     Lips burning  . Codeine Nausea Only  . Naproxen Nausea Only  . Sulfonamide Derivatives     Unknown     Past Medical History:  Diagnosis Date  . Allergy   . Anxiety   . Arrhythmia   . Diverticulosis   . Erosive esophagitis   . Fatty liver   . Fibromyalgia   . GERD (gastroesophageal reflux disease)   . Hiatal hernia   . Hyperlipidemia   .  Hyperplastic colon polyp   . Hypertension   . IBS (irritable bowel syndrome)   . Mitral regurgitation   . Osteoarthritis   . Palpitations   . Peptic stricture of esophagus   . Vitamin B12 deficiency      Past Surgical History:  Procedure Laterality Date  . ABDOMINAL HYSTERECTOMY    . APPENDECTOMY    . CHOLECYSTECTOMY    . RECTOCELE REPAIR    . TUBAL LIGATION      Family History  Problem Relation Age of Onset  . Hypertension Mother   . Ovarian cancer Mother   . Hypertension Father   . Kidney disease Sister        ESRD  . Fibromyalgia Sister        x 2  . Diabetes Sister   . Breast cancer Maternal Grandmother   . Colon cancer Neg Hx     Social  History   Tobacco Use  . Smoking status: Former Smoker    Quit date: 08/18/1991    Years since quitting: 29.0  . Smokeless tobacco: Never Used  Vaping Use  . Vaping Use: Never used  Substance Use Topics  . Alcohol use: Yes    Alcohol/week: 0.0 standard drinks    Comment: 1-2 glasses   . Drug use: No    ROS   Objective:   Vitals: BP (!) 182/106 (BP Location: Right Arm)   Pulse 87   Temp 97.9 F (36.6 C) (Oral)   Resp 17   SpO2 99%   BP Readings from Last 3 Encounters:  08/15/20 (!) 182/106  05/26/19 135/78  10/19/18 (!) 150/94   Physical Exam Constitutional:      General: She is not in acute distress.    Appearance: Normal appearance. She is well-developed. She is not ill-appearing, toxic-appearing or diaphoretic.  HENT:     Head: Normocephalic and atraumatic.     Nose: Nose normal.     Mouth/Throat:     Mouth: Mucous membranes are moist.  Eyes:     Extraocular Movements: Extraocular movements intact.     Pupils: Pupils are equal, round, and reactive to light.  Cardiovascular:     Rate and Rhythm: Normal rate and regular rhythm.     Pulses: Normal pulses.     Heart sounds: Normal heart sounds. No murmur heard. No friction rub. No gallop.   Pulmonary:     Effort: Pulmonary effort is normal. No respiratory distress.     Breath sounds: Normal breath sounds. No stridor. No wheezing, rhonchi or rales.  Skin:    General: Skin is warm and dry.     Findings: No rash.  Neurological:     Mental Status: She is alert and oriented to person, place, and time.     Cranial Nerves: No cranial nerve deficit.     Motor: No weakness.     Coordination: Coordination normal.     Gait: Gait normal.     Deep Tendon Reflexes: Reflexes normal.  Psychiatric:        Mood and Affect: Mood normal.        Behavior: Behavior normal.        Thought Content: Thought content normal.        Judgment: Judgment normal.     ED ECG REPORT   Date: 08/19/2020  Rate: 86bpm  Rhythm:  normal sinus rhythm and premature ventricular contractions (PVC)  QRS Axis: normal  Intervals: normal  ST/T Wave abnormalities: normal  Conduction Disutrbances:none  Narrative Interpretation: Sinus rhythm at 86 bpm with frequent PVCs.  Apart from the PVCs, comparable to previous EKG.  Old EKG Reviewed: changes noted  I have personally reviewed the EKG tracing and agree with the computerized printout as noted.   Assessment and Plan :   PDMP not reviewed this encounter.  1. Premature ventricular contractions   2. Essential hypertension   3. Elevated blood pressure reading   4. White coat syndrome with diagnosis of hypertension   5. Gastroesophageal reflux disease without esophagitis   6. Nonspecific abnormal electrocardiogram (ECG) (EKG)     Patient is very well-appearing, no active chest pain.  She does have frequent PVCs but is in sinus rhythm.  Apart from the PVCs EKG is comparable to previous one.  Recommended consultation with a cardiologist.  Patient was not as interested in this but did contract to follow-up with her PCP soon as possible.  At this time patient does not have any signs of ACS and therefore did not advised that she go to the emergency room.  I did recommend that she start taking her propranolol at half tablet twice daily which is as much she can tolerate.  Strict ER precautions. Counseled patient on potential for adverse effects with medications prescribed today, patient verbalized understanding.    Wallis Bamberg, [place] 08/19/20 [phone]

## 2020-08-15 NOTE — ED Triage Notes (Signed)
Pt presents with fluctuating blood pressure. States had a bad episode with heart burn 2 nights ago and BP has been elevated since. Denies chest pain.

## 2020-08-15 NOTE — Discharge Instructions (Signed)
Please call your heart doctor as soon as possible to get a consultation on your pvc, fluctuations in your pulses and blood pressure. In the meantime, start propranolol 1/2 tablet twice daily. Follow up with your PCP on Monday.

## 2020-08-19 ENCOUNTER — Encounter: Payer: Self-pay | Admitting: Internal Medicine

## 2020-08-19 ENCOUNTER — Telehealth (INDEPENDENT_AMBULATORY_CARE_PROVIDER_SITE_OTHER): Payer: Medicare Other | Admitting: Internal Medicine

## 2020-08-19 DIAGNOSIS — R011 Cardiac murmur, unspecified: Secondary | ICD-10-CM | POA: Diagnosis not present

## 2020-08-19 DIAGNOSIS — I519 Heart disease, unspecified: Secondary | ICD-10-CM

## 2020-08-19 DIAGNOSIS — J0100 Acute maxillary sinusitis, unspecified: Secondary | ICD-10-CM | POA: Diagnosis not present

## 2020-08-19 DIAGNOSIS — R002 Palpitations: Secondary | ICD-10-CM | POA: Diagnosis not present

## 2020-08-19 MED ORDER — AZITHROMYCIN 250 MG PO TABS: ORAL_TABLET | ORAL | 0 refills | Status: DC

## 2020-08-19 NOTE — Assessment & Plan Note (Signed)
Will get an ECHO

## 2020-08-19 NOTE — Progress Notes (Signed)
Virtual Visit via Telephone Note  I connected with Laura Vasquez on 08/19/20 at  7:50 AM EST by telephone and verified that I am speaking with the correct person using two identifiers.  Location: Patient: home Provider: [facility]   I discussed the limitations, risks, security and privacy concerns of performing an evaluation and management service by telephone and the availability of in person appointments. I also discussed with the patient that there may be a patient responsible charge related to this service. The patient expressed understanding and agreed to proceed.   History of Present Illness:  The pt went to [facility] for palpitations. BP is ok now, feeling normal.  C/o stress at home - coping w/it OK.  Per UC:  1. Premature ventricular contractions   2. Essential hypertension   3. Elevated blood pressure reading   4. White coat syndrome with diagnosis of hypertension   5. Gastroesophageal reflux disease without esophagitis   6. Nonspecific abnormal electrocardiogram (ECG) (EKG)        Observations/Objective:  Pt sounds nl on the phone  Assessment and Plan:  See plan  Follow Up Instructions:    I discussed the assessment and treatment plan with the patient. The patient was provided an opportunity to ask questions and all were answered. The patient agreed with the plan and demonstrated an understanding of the instructions.   The patient was advised to call back or seek an in-person evaluation if the symptoms worsen or if the condition fails to improve as anticipated.  I provided 22 minutes of non-face-to-face time during this encounter.   Sonda Primes, MD

## 2020-08-19 NOTE — Assessment & Plan Note (Signed)
Zpac 

## 2020-09-09 ENCOUNTER — Other Ambulatory Visit (HOSPITAL_COMMUNITY): Payer: Medicare Other

## 2020-09-12 ENCOUNTER — Other Ambulatory Visit (HOSPITAL_COMMUNITY): Payer: Medicare Other

## 2020-09-12 ENCOUNTER — Encounter (HOSPITAL_COMMUNITY): Payer: Self-pay

## 2020-09-12 NOTE — Progress Notes (Signed)
Verified appointment "no show" status with A. Martinez at 09:30.

## 2020-09-13 ENCOUNTER — Ambulatory Visit (HOSPITAL_COMMUNITY): Payer: Medicare Other | Attending: Cardiology

## 2020-09-13 ENCOUNTER — Other Ambulatory Visit: Payer: Self-pay

## 2020-09-13 DIAGNOSIS — R002 Palpitations: Secondary | ICD-10-CM | POA: Insufficient documentation

## 2020-09-13 DIAGNOSIS — R011 Cardiac murmur, unspecified: Secondary | ICD-10-CM | POA: Diagnosis present

## 2020-09-13 DIAGNOSIS — I519 Heart disease, unspecified: Secondary | ICD-10-CM | POA: Diagnosis present

## 2020-09-13 LAB — ECHOCARDIOGRAM COMPLETE
Area-P 1/2: 2.76 cm2
S' Lateral: 3 cm

## 2020-09-17 ENCOUNTER — Telehealth: Payer: Self-pay | Admitting: Internal Medicine

## 2020-09-17 NOTE — Telephone Encounter (Signed)
Patient said she got a call from our office in regards to a test she recently had done. She can be reached at [phone].

## 2020-09-17 NOTE — Telephone Encounter (Signed)
Called pt back to discuss, LVM.

## 2020-09-18 NOTE — Telephone Encounter (Signed)
Called pt verified which test she is pertaining too. Pt states she had a echocardiogram  Done last Friday. Per echo cardiogram MD states  "  Please inform the patient that a her echocardiogram looks normal, except for a slight stiffness of the left ventricle. Please continue propranolol. Thanks, AP". Gave pt MD response on report.Marland KitchenJohny Chess

## 2020-09-18 NOTE — Telephone Encounter (Signed)
Patient states she never got a call yesterday, would like someone to call back. Patient is anxious about results.

## 2020-11-04 ENCOUNTER — Other Ambulatory Visit (INDEPENDENT_AMBULATORY_CARE_PROVIDER_SITE_OTHER): Payer: Medicare Other

## 2020-11-04 ENCOUNTER — Other Ambulatory Visit: Payer: Self-pay

## 2020-11-04 DIAGNOSIS — E538 Deficiency of other specified B group vitamins: Secondary | ICD-10-CM | POA: Diagnosis not present

## 2020-11-04 DIAGNOSIS — D751 Secondary polycythemia: Secondary | ICD-10-CM | POA: Diagnosis not present

## 2020-11-04 LAB — CBC WITH DIFFERENTIAL/PLATELET
Basophils Absolute: 0 10*3/uL (ref 0.0–0.1)
Basophils Relative: 0.6 % (ref 0.0–3.0)
Eosinophils Absolute: 0.1 10*3/uL (ref 0.0–0.7)
Eosinophils Relative: 2 % (ref 0.0–5.0)
HCT: 43.4 % (ref 36.0–46.0)
Hemoglobin: 14.6 g/dL (ref 12.0–15.0)
Lymphocytes Relative: 32.5 % (ref 12.0–46.0)
Lymphs Abs: 1.4 10*3/uL (ref 0.7–4.0)
MCHC: 33.5 g/dL (ref 30.0–36.0)
MCV: 91.1 fl (ref 78.0–100.0)
Monocytes Absolute: 0.4 10*3/uL (ref 0.1–1.0)
Monocytes Relative: 9.9 % (ref 3.0–12.0)
Neutro Abs: 2.4 10*3/uL (ref 1.4–7.7)
Neutrophils Relative %: 55 % (ref 43.0–77.0)
Platelets: 221 10*3/uL (ref 150.0–400.0)
RBC: 4.77 Mil/uL (ref 3.87–5.11)
RDW: 14.1 % (ref 11.5–15.5)
WBC: 4.4 10*3/uL (ref 4.0–10.5)

## 2020-11-04 LAB — COMPREHENSIVE METABOLIC PANEL
ALT: 20 U/L (ref 0–35)
AST: 24 U/L (ref 0–37)
Albumin: 4.5 g/dL (ref 3.5–5.2)
Alkaline Phosphatase: 36 U/L — ABNORMAL LOW (ref 39–117)
BUN: 13 mg/dL (ref 6–23)
CO2: 28 mEq/L (ref 19–32)
Calcium: 9.5 mg/dL (ref 8.4–10.5)
Chloride: 101 mEq/L (ref 96–112)
Creatinine, Ser: 0.82 mg/dL (ref 0.40–1.20)
GFR: 69.32 mL/min (ref >60.00)
Glucose, Bld: 103 mg/dL — ABNORMAL HIGH (ref 70–99)
Potassium: 4.3 mEq/L (ref 3.5–5.1)
Sodium: 138 mEq/L (ref 135–145)
Total Bilirubin: 1 mg/dL (ref 0.2–1.2)
Total Protein: 7.1 g/dL (ref 6.0–8.3)

## 2020-11-07 ENCOUNTER — Encounter: Payer: Self-pay | Admitting: Internal Medicine

## 2020-11-07 ENCOUNTER — Ambulatory Visit (INDEPENDENT_AMBULATORY_CARE_PROVIDER_SITE_OTHER): Payer: Medicare Other | Admitting: Internal Medicine

## 2020-11-07 ENCOUNTER — Other Ambulatory Visit: Payer: Self-pay

## 2020-11-07 DIAGNOSIS — D751 Secondary polycythemia: Secondary | ICD-10-CM

## 2020-11-07 DIAGNOSIS — F419 Anxiety disorder, unspecified: Secondary | ICD-10-CM | POA: Diagnosis not present

## 2020-11-07 DIAGNOSIS — I519 Heart disease, unspecified: Secondary | ICD-10-CM | POA: Diagnosis not present

## 2020-11-07 DIAGNOSIS — K219 Gastro-esophageal reflux disease without esophagitis: Secondary | ICD-10-CM

## 2020-11-07 DIAGNOSIS — M797 Fibromyalgia: Secondary | ICD-10-CM | POA: Diagnosis not present

## 2020-11-07 DIAGNOSIS — E785 Hyperlipidemia, unspecified: Secondary | ICD-10-CM

## 2020-11-07 DIAGNOSIS — I10 Essential (primary) hypertension: Secondary | ICD-10-CM

## 2020-11-07 NOTE — Assessment & Plan Note (Signed)
Try Lion's mane 

## 2020-11-07 NOTE — Assessment & Plan Note (Signed)
BP ok at home most of the time

## 2020-11-07 NOTE — Progress Notes (Signed)
Subjective:  Patient ID: [name], female    DOB: 05-21-1946  Age: 75 y.o. MRN: [id number]  CC: Follow-up (3 month f/u- Req copy of labs)   HPI Dulcinea Kinser [facility] presents for stress, GERD, HTN, diastolic dysfunction f/u BP OK at home  Outpatient Medications Prior to Visit  Medication Sig Dispense Refill  . aspirin EC 81 MG tablet Take 81 mg by mouth daily.    . B Complex Vitamins (B COMPLEX PO) Take 1 tablet by mouth daily as needed.     . calcium carbonate (TUMS - DOSED IN MG ELEMENTAL CALCIUM) 500 MG chewable tablet Chew 1 tablet by mouth as needed.     . cholecalciferol (VITAMIN D) 1000 UNITS tablet Take 2,000 Units by mouth daily.    . cyanocobalamin 500 MCG tablet Take 500 mcg by mouth daily as needed (Alternate b/t vitamin b complex).     . diazepam (VALIUM) 5 MG tablet Take 0.5-1 tablets (2.5-5 mg total) by mouth every 12 (twelve) hours as needed for anxiety. 60 tablet 5  . dicyclomine (BENTYL) 10 MG capsule Take 1 capsule (10 mg total) by mouth 2 (two) times daily as needed for spasms. 60 capsule 5  . MAGNESIUM PO Take 800 mg by mouth.    . meclizine (ANTIVERT) 25 MG tablet Take 1 tablet (25 mg total) by mouth 3 (three) times daily as needed for dizziness. (Patient taking differently: Take 25 mg by mouth as needed for dizziness.) 30 tablet 1  . Omega-3 Fatty Acids (FISH OIL PO) Take 1-2 each by mouth daily as needed.    Marland Kitchen omeprazole (PRILOSEC) 20 MG capsule TAKE 1 CAPSULE BY MOUTH EVERY DAY 30 capsule 11  . propranolol (INDERAL) 10 MG tablet take 1 tablet by mouth twice a day (Patient taking differently: Take 10 mg by mouth daily.) 60 tablet 5  . Psyllium (METAMUCIL PO) Take by mouth daily. 1 tablespoon    . azithromycin (ZITHROMAX Z-PAK) 250 MG tablet As directed (Patient not taking: Reported on 11/07/2020) 6 tablet 0  . Na Sulfate-K Sulfate-Mg Sulf 17.5-3.13-1.6 GM/177ML SOLN Suprep-Use as directed (Patient not taking: No sig reported) 354 mL 0   No facility-administered  medications prior to visit.    ROS: Review of Systems  Constitutional: Negative for activity change, appetite change, chills, fatigue and unexpected weight change.  HENT: Negative for congestion, mouth sores and sinus pressure.   Eyes: Negative for visual disturbance.  Respiratory: Negative for cough and chest tightness.   Gastrointestinal: Negative for abdominal pain and nausea.  Genitourinary: Negative for difficulty urinating, frequency and vaginal pain.  Musculoskeletal: Negative for back pain and gait problem.  Skin: Negative for pallor and rash.  Neurological: Negative for dizziness, tremors, weakness, numbness and headaches.  Psychiatric/Behavioral: Negative for confusion, sleep disturbance and suicidal ideas. The patient is nervous/anxious.     Objective:  BP (!) 168/98 (BP Location: Left Arm)   Pulse 75   Temp 98 F (36.7 C) (Oral)   Ht 5\' 4"  (1.626 m)   Wt 183 lb (83 kg)   SpO2 98%   BMI 31.41 kg/m   BP Readings from Last 3 Encounters:  11/07/20 (!) 168/98  08/15/20 (!) 182/106  05/26/19 135/78    Wt Readings from Last 3 Encounters:  11/07/20 183 lb (83 kg)  08/01/20 182 lb 6.4 oz (82.7 kg)  04/01/20 180 lb (81.6 kg)    Physical Exam Constitutional:      General: She is not in acute distress.  Appearance: She is well-developed.  HENT:     Head: Normocephalic.     Right Ear: External ear normal.     Left Ear: External ear normal.     Nose: Nose normal.  Eyes:     General:        Right eye: No discharge.        Left eye: No discharge.     Conjunctiva/sclera: Conjunctivae normal.     Pupils: Pupils are equal, round, and reactive to light.  Neck:     Thyroid: No thyromegaly.     Vascular: No JVD.     Trachea: No tracheal deviation.  Cardiovascular:     Rate and Rhythm: Normal rate and regular rhythm.     Heart sounds: Normal heart sounds.  Pulmonary:     Effort: No respiratory distress.     Breath sounds: No stridor. No wheezing.  Abdominal:      General: Bowel sounds are normal. There is no distension.     Palpations: Abdomen is soft. There is no mass.     Tenderness: There is no abdominal tenderness. There is no guarding or rebound.  Musculoskeletal:        General: No tenderness.     Cervical back: Normal range of motion and neck supple.  Lymphadenopathy:     Cervical: No cervical adenopathy.  Skin:    Findings: No erythema or rash.  Neurological:     Cranial Nerves: No cranial nerve deficit.     Motor: No abnormal muscle tone.     Coordination: Coordination normal.     Deep Tendon Reflexes: Reflexes normal.  Psychiatric:        Behavior: Behavior normal.        Thought Content: Thought content normal.        Judgment: Judgment normal.     Lab Results  Component Value Date   WBC 4.4 11/04/2020   HGB 14.6 11/04/2020   HCT 43.4 11/04/2020   PLT 221.0 11/04/2020   GLUCOSE 103 (H) 11/04/2020   CHOL 248 (H) 07/29/2020   TRIG 93.0 07/29/2020   HDL 99.00 07/29/2020   LDLDIRECT 172.9 04/03/2013   LDLCALC 130 (H) 07/29/2020   ALT 20 11/04/2020   AST 24 11/04/2020   NA 138 11/04/2020   K 4.3 11/04/2020   CL 101 11/04/2020   CREATININE 0.82 11/04/2020   BUN 13 11/04/2020   CO2 28 11/04/2020   TSH 3.32 07/29/2020   HGBA1C 5.8 07/29/2020   MICROALBUR <0.7 05/21/2017    No results found.  Assessment & Plan:    Walker Kehr, MD

## 2020-11-07 NOTE — Assessment & Plan Note (Signed)
ECHO reviewed Cont w/Propranolol

## 2020-11-07 NOTE — Assessment & Plan Note (Signed)
Try Valerian root for anxiety Lions mane

## 2020-11-07 NOTE — Assessment & Plan Note (Signed)
On Omeprazole 

## 2020-11-07 NOTE — Patient Instructions (Signed)
Valerian root for anxiety 

## 2020-11-07 NOTE — Assessment & Plan Note (Signed)
CBC is ok

## 2020-11-07 NOTE — Assessment & Plan Note (Signed)
She declined statins in the past

## 2020-11-11 ENCOUNTER — Ambulatory Visit: Payer: Medicare Other | Admitting: Internal Medicine

## 2020-11-21 ENCOUNTER — Telehealth: Payer: Self-pay | Admitting: Internal Medicine

## 2020-11-21 NOTE — Telephone Encounter (Signed)
Called pt to schedule AWV with [facility]. Patient stated she will call the office phone # to schedule, because she does not know if I am really calling from PCP office.

## 2021-03-10 ENCOUNTER — Ambulatory Visit (INDEPENDENT_AMBULATORY_CARE_PROVIDER_SITE_OTHER): Payer: Medicare Other | Admitting: Internal Medicine

## 2021-03-10 ENCOUNTER — Other Ambulatory Visit: Payer: Self-pay

## 2021-03-10 ENCOUNTER — Encounter: Payer: Self-pay | Admitting: Internal Medicine

## 2021-03-10 VITALS — HR 66 | Temp 98.2°F | Wt 182.8 lb

## 2021-03-10 DIAGNOSIS — L723 Sebaceous cyst: Secondary | ICD-10-CM | POA: Insufficient documentation

## 2021-03-10 DIAGNOSIS — R252 Cramp and spasm: Secondary | ICD-10-CM

## 2021-03-10 DIAGNOSIS — J301 Allergic rhinitis due to pollen: Secondary | ICD-10-CM

## 2021-03-10 DIAGNOSIS — Z23 Encounter for immunization: Secondary | ICD-10-CM

## 2021-03-10 DIAGNOSIS — E785 Hyperlipidemia, unspecified: Secondary | ICD-10-CM

## 2021-03-10 DIAGNOSIS — I10 Essential (primary) hypertension: Secondary | ICD-10-CM | POA: Diagnosis not present

## 2021-03-10 DIAGNOSIS — E538 Deficiency of other specified B group vitamins: Secondary | ICD-10-CM

## 2021-03-10 DIAGNOSIS — D582 Other hemoglobinopathies: Secondary | ICD-10-CM

## 2021-03-10 DIAGNOSIS — F419 Anxiety disorder, unspecified: Secondary | ICD-10-CM | POA: Diagnosis not present

## 2021-03-10 DIAGNOSIS — J0100 Acute maxillary sinusitis, unspecified: Secondary | ICD-10-CM

## 2021-03-10 MED ORDER — OMEPRAZOLE 20 MG PO CPDR: DELAYED_RELEASE_CAPSULE | ORAL | 11 refills | Status: DC

## 2021-03-10 MED ORDER — AZITHROMYCIN 250 MG PO TABS: ORAL_TABLET | ORAL | 0 refills | Status: DC

## 2021-03-10 MED ORDER — DIPHENHYDRAMINE HCL 12.5 MG PO CHEW: 12.5000 mg | CHEWABLE_TABLET | Freq: Every evening | ORAL | 0 refills | Status: AC | PRN

## 2021-03-10 NOTE — Progress Notes (Signed)
Subjective:  Patient ID: Laura Vasquez, female    DOB: 05-11-1946  Age: 75 y.o. MRN: [id number]  CC: Follow-up (4 month f/u) and Medication Refill (Want printed rx for Omeprazole )   HPI Laura Vasquez presents for a black head on trunk C/o ?sinus infection - thick mucus F/u HTN  - nl BP at home C/o cramps in legs  Outpatient Medications Prior to Visit  Medication Sig Dispense Refill   aspirin EC 81 MG tablet Take 81 mg by mouth daily.     B Complex Vitamins (B COMPLEX PO) Take 1 tablet by mouth daily as needed.      calcium carbonate (TUMS - DOSED IN MG ELEMENTAL CALCIUM) 500 MG chewable tablet Chew 1 tablet by mouth as needed.      cholecalciferol (VITAMIN D) 1000 UNITS tablet Take 2,000 Units by mouth daily.     cyanocobalamin 500 MCG tablet Take 500 mcg by mouth daily as needed (Alternate b/t vitamin b complex).      MAGNESIUM PO Take 800 mg by mouth.     meclizine (ANTIVERT) 25 MG tablet Take 1 tablet (25 mg total) by mouth 3 (three) times daily as needed for dizziness. (Patient taking differently: Take 25 mg by mouth as needed for dizziness.) 30 tablet 1   Omega-3 Fatty Acids (FISH OIL PO) Take 1-2 each by mouth daily as needed.     propranolol (INDERAL) 10 MG tablet take 1 tablet by mouth twice a day (Patient taking differently: Take 10 mg by mouth daily.) 60 tablet 5   Psyllium (METAMUCIL PO) Take by mouth daily. 1 tablespoon     omeprazole (PRILOSEC) 20 MG capsule TAKE 1 CAPSULE BY MOUTH EVERY DAY 30 capsule 11   diazepam (VALIUM) 5 MG tablet Take 0.5-1 tablets (2.5-5 mg total) by mouth every 12 (twelve) hours as needed for anxiety. (Patient not taking: Reported on 03/10/2021) 60 tablet 5   dicyclomine (BENTYL) 10 MG capsule Take 1 capsule (10 mg total) by mouth 2 (two) times daily as needed for spasms. (Patient not taking: Reported on 03/10/2021) 60 capsule 5   No facility-administered medications prior to visit.    ROS: Review of Systems  Objective:  Pulse 66   Temp 98.2  F (36.8 C) (Oral)   Wt 182 lb 12.8 oz (82.9 kg)   SpO2 96%   BMI 31.38 kg/m   BP Readings from Last 3 Encounters:  11/07/20 (!) 168/98  08/15/20 (!) 182/106  05/26/19 135/78    Wt Readings from Last 3 Encounters:  03/10/21 182 lb 12.8 oz (82.9 kg)  11/07/20 183 lb (83 kg)  08/01/20 182 lb 6.4 oz (82.7 kg)    Physical Exam  Lab Results  Component Value Date   WBC 4.4 11/04/2020   HGB 14.6 11/04/2020   HCT 43.4 11/04/2020   PLT 221.0 11/04/2020   GLUCOSE 103 (H) 11/04/2020   CHOL 248 (H) 07/29/2020   TRIG 93.0 07/29/2020   HDL 99.00 07/29/2020   LDLDIRECT 172.9 04/03/2013   LDLCALC 130 (H) 07/29/2020   ALT 20 11/04/2020   AST 24 11/04/2020   NA 138 11/04/2020   K 4.3 11/04/2020   CL 101 11/04/2020   CREATININE 0.82 11/04/2020   BUN 13 11/04/2020   CO2 28 11/04/2020   TSH 3.32 07/29/2020   HGBA1C 5.8 07/29/2020   MICROALBUR <0.7 05/21/2017    No results found.  Assessment & Plan:   There are no diagnoses linked to this encounter.  Meds ordered this encounter  Medications   omeprazole (PRILOSEC) 20 MG capsule    Sig: TAKE 1 CAPSULE BY MOUTH EVERY DAY    Dispense:  30 capsule    Refill:  11     Follow-up: No follow-ups on file.  Walker Kehr, MD

## 2021-03-10 NOTE — Assessment & Plan Note (Signed)
On Nasocort

## 2021-03-10 NOTE — Assessment & Plan Note (Signed)
Denies OSA

## 2021-03-10 NOTE — Addendum Note (Signed)
Addended by: Earnstine Regal on: 03/10/2021 09:01 AM   Modules accepted: Orders

## 2021-03-10 NOTE — Assessment & Plan Note (Signed)
She declined statins Coronary calcium CT offered

## 2021-03-10 NOTE — Assessment & Plan Note (Signed)
Try Benadryl 12.5 mg at hs prn

## 2021-03-10 NOTE — Assessment & Plan Note (Signed)
On Propranolol Cardiac cor calcium CT offered - declined

## 2021-03-10 NOTE — Assessment & Plan Note (Signed)
Valerian root for anxiety [place] man[place]

## 2021-03-10 NOTE — Assessment & Plan Note (Signed)
Getting better.

## 2021-03-10 NOTE — Assessment & Plan Note (Signed)
We prescribed a Zpack

## 2021-03-10 NOTE — Assessment & Plan Note (Signed)
On Vit B12 

## 2021-05-14 ENCOUNTER — Ambulatory Visit (INDEPENDENT_AMBULATORY_CARE_PROVIDER_SITE_OTHER): Payer: Medicare Other | Admitting: Internal Medicine

## 2021-05-14 ENCOUNTER — Encounter: Payer: Self-pay | Admitting: Internal Medicine

## 2021-05-14 ENCOUNTER — Other Ambulatory Visit: Payer: Self-pay

## 2021-05-14 DIAGNOSIS — F439 Reaction to severe stress, unspecified: Secondary | ICD-10-CM | POA: Diagnosis not present

## 2021-05-14 DIAGNOSIS — J0100 Acute maxillary sinusitis, unspecified: Secondary | ICD-10-CM

## 2021-05-14 DIAGNOSIS — K5792 Diverticulitis of intestine, part unspecified, without perforation or abscess without bleeding: Secondary | ICD-10-CM

## 2021-05-14 DIAGNOSIS — F419 Anxiety disorder, unspecified: Secondary | ICD-10-CM

## 2021-05-14 MED ORDER — LEVOFLOXACIN 500 MG PO TABS
500.0000 mg | ORAL_TABLET | Freq: Every day | ORAL | 0 refills | Status: DC
Start: 2021-05-14 — End: 2021-07-24

## 2021-05-14 NOTE — Assessment & Plan Note (Addendum)
Early stages - new Start Levaquin 500 mg qd Info given Check CBC

## 2021-05-14 NOTE — Patient Instructions (Signed)
Diverticulitis Diverticulitis is infection or inflammation of small pouches (diverticula) in the colon that form due to a condition called diverticulosis. Diverticula can trap stool (feces) and bacteria, causing infection and inflammation. Diverticulitis may cause severe stomach pain and diarrhea. It may lead to tissue damage in the colon that causes bleeding or blockage. The diverticula may also burst (rupture) and cause infected stool to enter other areas of the abdomen. What are the causes? This condition is caused by stool becoming trapped in the diverticula, which allows bacteria to grow in the diverticula. This leads to inflammation and infection. What increases the risk? You are more likely to develop this condition if you have diverticulosis. The risk increases if you: Are overweight or obese. Do not get enough exercise. Drink alcohol. Use tobacco products. Eat a diet that has a lot of red meat such as beef, pork, or lamb. Eat a diet that does not include enough fiber. High-fiber foods include fruits, vegetables, beans, nuts, and whole grains. Are over 40 years of age. What are the signs or symptoms? Symptoms of this condition may include: Pain and tenderness in the abdomen. The pain is normally located on the left side of the abdomen, but it may occur in other areas. Fever and chills. Nausea. Vomiting. Cramping. Bloating. Changes in bowel routines. Blood in your stool. How is this diagnosed? This condition is diagnosed based on: Your medical history. A physical exam. Tests to make sure there is nothing else causing your condition. These tests may include: Blood tests. Urine tests. CT scan of the abdomen. How is this treated? Most cases of this condition are mild and can be treated at home. Treatment may include: Taking over-the-counter pain medicines. Following a clear liquid diet. Taking antibiotic medicines by mouth. Resting. More severe cases may need to be treated  at a hospital. Treatment may include: Not eating or drinking. Taking prescription pain medicine. Receiving antibiotic medicines through an IV. Receiving fluids and nutrition through an IV. Surgery. When your condition is under control, your health care provider may recommend that you have a colonoscopy. This is an exam to look at the entire large intestine. During the exam, a lubricated, bendable tube is inserted into the anus and then passed into the rectum, colon, and other parts of the large intestine. A colonoscopy can show how severe your diverticula are and whether something else may be causing your symptoms. Follow these instructions at home: Medicines Take over-the-counter and prescription medicines only as told by your health care provider. These include fiber supplements, probiotics, and stool softeners. If you were prescribed an antibiotic medicine, take it as told by your health care provider. Do not stop taking the antibiotic even if you start to feel better. Ask your health care provider if the medicine prescribed to you requires you to avoid driving or using machinery. Eating and drinking  Follow a full liquid diet or another diet as directed by your health care provider. After your symptoms improve, your health care provider may tell you to change your diet. He or she may recommend that you eat a diet that contains at least 25 grams (25 g) of fiber daily. Fiber makes it easier to pass stool. Healthy sources of fiber include: Berries. One cup contains 4-8 grams of fiber. Beans or lentils. One-half cup contains 5-8 grams of fiber. Green vegetables. One cup contains 4 grams of fiber. Avoid eating red meat. General instructions Do not use any products that contain nicotine or tobacco, such as cigarettes,   e-cigarettes, and chewing tobacco. If you need help quitting, ask your health care provider. Exercise for at least 30 minutes, 3 times each week. You should exercise hard enough to  raise your heart rate and break a sweat. Keep all follow-up visits as told by your health care provider. This is important. You may need to have a colonoscopy. Contact a health care provider if: Your pain does not improve. Your bowel movements do not return to normal. Get help right away if: Your pain gets worse. Your symptoms do not get better with treatment. Your symptoms suddenly get worse. You have a fever. You vomit more than one time. You have stools that are bloody, black, or tarry. Summary Diverticulitis is infection or inflammation of small pouches (diverticula) in the colon that form due to a condition called diverticulosis. Diverticula can trap stool (feces) and bacteria, causing infection and inflammation. You are at higher risk for this condition if you have diverticulosis and you eat a diet that does not include enough fiber. Most cases of this condition are mild and can be treated at home. More severe cases may need to be treated at a hospital. When your condition is under control, your health care provider may recommend that you have an exam called a colonoscopy. This exam can show how severe your diverticula are and whether something else may be causing your symptoms. Keep all follow-up visits as told by your health care provider. This is important. This information is not intended to replace advice given to you by your health care provider. Make sure you discuss any questions you have with your health care provider. Document Revised: 05/15/2019 Document Reviewed: 05/15/2019 Elsevier Patient Education  2022 [employer]  

## 2021-05-14 NOTE — Progress Notes (Signed)
Subjective:  Patient ID: Laura Vasquez, female    DOB: [birth year]  Age: 75 y.o. MRN: [id number]  CC: Diverticulitis   HPI [name] [facility] Vasquez presents for diverticulosis/-itis flare up x 1-2 day - LLQ pain C/o ear ache B. No n/v. No chills Son had a CVA in 7/22 - stressed  Outpatient Medications Prior to Visit  Medication Sig Dispense Refill   aspirin EC 81 MG tablet Take 81 mg by mouth daily.     B Complex Vitamins (B COMPLEX PO) Take 1 tablet by mouth daily as needed.      calcium carbonate (TUMS - DOSED IN MG ELEMENTAL CALCIUM) 500 MG chewable tablet Chew 1 tablet by mouth as needed.      cholecalciferol (VITAMIN D) 1000 UNITS tablet Take 2,000 Units by mouth daily.     cyanocobalamin 500 MCG tablet Take 500 mcg by mouth daily as needed (Alternate b/t vitamin b complex).      diphenhydrAMINE (BENADRYL ALLERGY CHILDRENS) 12.5 MG chewable tablet Chew 1 tablet (12.5 mg total) by mouth at bedtime as needed (cramps). 30 tablet 0   MAGNESIUM PO Take 800 mg by mouth.     meclizine (ANTIVERT) 25 MG tablet Take 1 tablet (25 mg total) by mouth 3 (three) times daily as needed for dizziness. (Patient taking differently: Take 25 mg by mouth as needed for dizziness.) 30 tablet 1   Omega-3 Fatty Acids (FISH OIL PO) Take 1-2 each by mouth daily as needed.     omeprazole (PRILOSEC) 20 MG capsule TAKE 1 CAPSULE BY MOUTH EVERY DAY 30 capsule 11   propranolol (INDERAL) 10 MG tablet take 1 tablet by mouth twice a day (Patient taking differently: Take 10 mg by mouth daily.) 60 tablet 5   Psyllium (METAMUCIL PO) Take by mouth daily. 1 tablespoon     azithromycin (ZITHROMAX Z-PAK) 250 MG tablet As directed (Patient not taking: Reported on 05/14/2021) 6 tablet 0   No facility-administered medications prior to visit.    ROS: Review of Systems  Constitutional:  Positive for appetite change and fatigue. Negative for activity change, chills and unexpected weight change.  HENT:  Negative for congestion, mouth sores  and sinus pressure.   Eyes:  Negative for visual disturbance.  Respiratory:  Negative for cough and chest tightness.   Gastrointestinal:  Positive for abdominal pain, diarrhea and nausea. Negative for abdominal distention, blood in stool, constipation and vomiting.  Genitourinary:  Negative for difficulty urinating, flank pain, frequency and vaginal pain.  Musculoskeletal:  Negative for back pain and gait problem.  Skin:  Negative for pallor and rash.  Neurological:  Negative for dizziness, tremors, weakness, numbness and headaches.  Psychiatric/Behavioral:  Negative for confusion and sleep disturbance. The patient is nervous/anxious.    Objective:  Pulse 85   Temp 98.4 F (36.9 C) (Oral)   Ht 5\' 4"  (1.626 m)   Wt 180 lb 6.4 oz (81.8 kg)   SpO2 96%   BMI 30.97 kg/m   BP Readings from Last 3 Encounters:  11/07/20 (!) 168/98  08/15/20 (!) 182/106  05/26/19 135/78    Wt Readings from Last 3 Encounters:  05/14/21 180 lb 6.4 oz (81.8 kg)  03/10/21 182 lb 12.8 oz (82.9 kg)  11/07/20 183 lb (83 kg)    Physical Exam Constitutional:      General: She is not in acute distress.    Appearance: She is well-developed.  HENT:     Head: Normocephalic.     Right Ear:  External ear normal.     Left Ear: External ear normal.     Nose: Nose normal.  Eyes:     General:        Right eye: No discharge.        Left eye: No discharge.     Conjunctiva/sclera: Conjunctivae normal.     Pupils: Pupils are equal, round, and reactive to light.  Neck:     Thyroid: No thyromegaly.     Vascular: No JVD.     Trachea: No tracheal deviation.  Cardiovascular:     Rate and Rhythm: Normal rate and regular rhythm.     Heart sounds: Normal heart sounds.  Pulmonary:     Effort: No respiratory distress.     Breath sounds: No stridor. No wheezing.  Abdominal:     General: Bowel sounds are normal. There is no distension.     Palpations: Abdomen is soft. There is no mass.     Tenderness: There is  abdominal tenderness. There is no guarding or rebound.  Musculoskeletal:        General: No tenderness.     Cervical back: Normal range of motion and neck supple. No rigidity.  Lymphadenopathy:     Cervical: No cervical adenopathy.  Skin:    Findings: No erythema or rash.  Neurological:     Cranial Nerves: No cranial nerve deficit.     Motor: No abnormal muscle tone.     Coordination: Coordination normal.     Deep Tendon Reflexes: Reflexes normal.  Psychiatric:        Behavior: Behavior normal.        Thought Content: Thought content normal.        Judgment: Judgment normal.  Tearful   Lab Results  Component Value Date   WBC 4.4 11/04/2020   HGB 14.6 11/04/2020   HCT 43.4 11/04/2020   PLT 221.0 11/04/2020   GLUCOSE 103 (H) 11/04/2020   CHOL 248 (H) 07/29/2020   TRIG 93.0 07/29/2020   HDL 99.00 07/29/2020   LDLDIRECT 172.9 04/03/2013   LDLCALC 130 (H) 07/29/2020   ALT 20 11/04/2020   AST 24 11/04/2020   NA 138 11/04/2020   K 4.3 11/04/2020   CL 101 11/04/2020   CREATININE 0.82 11/04/2020   BUN 13 11/04/2020   CO2 28 11/04/2020   TSH 3.32 07/29/2020   HGBA1C 5.8 07/29/2020   MICROALBUR <0.7 05/21/2017    No results found.  Assessment & Plan:   Problem List Items Addressed This Visit     Acute sinus infection    New Start Levaquin      Relevant Medications   levofloxacin (LEVAQUIN) 500 MG tablet   Anxiety    Son had a CVA in 7/22 - stressed      Diverticulitis    Early stages - new Start Levaquin 500 mg qd Info given Check CBC       Stress at home    Son had a CVA in 7/22 - discussed         Walker Kehr, MD

## 2021-05-14 NOTE — Assessment & Plan Note (Signed)
Son had a CVA in 7/22 - discussed

## 2021-05-14 NOTE — Assessment & Plan Note (Signed)
Son had a CVA in 7/22 - stressed

## 2021-05-14 NOTE — Assessment & Plan Note (Addendum)
New Start Levaquin

## 2021-05-29 ENCOUNTER — Other Ambulatory Visit: Payer: Self-pay

## 2021-05-29 ENCOUNTER — Encounter: Payer: Self-pay | Admitting: Internal Medicine

## 2021-05-29 ENCOUNTER — Ambulatory Visit (INDEPENDENT_AMBULATORY_CARE_PROVIDER_SITE_OTHER): Payer: Medicare Other | Admitting: Internal Medicine

## 2021-05-29 DIAGNOSIS — F439 Reaction to severe stress, unspecified: Secondary | ICD-10-CM | POA: Diagnosis not present

## 2021-05-29 DIAGNOSIS — K5730 Diverticulosis of large intestine without perforation or abscess without bleeding: Secondary | ICD-10-CM | POA: Diagnosis not present

## 2021-05-29 DIAGNOSIS — E538 Deficiency of other specified B group vitamins: Secondary | ICD-10-CM | POA: Diagnosis not present

## 2021-05-29 DIAGNOSIS — K5792 Diverticulitis of intestine, part unspecified, without perforation or abscess without bleeding: Secondary | ICD-10-CM | POA: Diagnosis not present

## 2021-05-29 MED ORDER — GLYCOPYRROLATE 2 MG PO TABS: 2.0000 mg | ORAL_TABLET | Freq: Three times a day (TID) | ORAL | 3 refills | Status: DC | PRN

## 2021-05-29 NOTE — Assessment & Plan Note (Signed)
Improving - finished Levaquin

## 2021-05-29 NOTE — Assessment & Plan Note (Signed)
Discussed w/pt. Son had a CVA in 7/22

## 2021-05-29 NOTE — Assessment & Plan Note (Signed)
On B12 

## 2021-05-29 NOTE — Assessment & Plan Note (Signed)
Robinul prn cramps Diet

## 2021-05-29 NOTE — Progress Notes (Signed)
Subjective:  Patient ID: Laura Vasquez, female    DOB: 1945-11-17  Age: 75 y.o. MRN: [id number]  CC: Follow-up (2 week f/u)   HPI Laura Vasquez [facility] presents for diverticulitis - better; stress. Laura Vasquez finished Levaquin   Outpatient Medications Prior to Visit  Medication Sig Dispense Refill   aspirin EC 81 MG tablet Take 81 mg by mouth daily.     B Complex Vitamins (B COMPLEX PO) Take 1 tablet by mouth daily as needed.      calcium carbonate (TUMS - DOSED IN MG ELEMENTAL CALCIUM) 500 MG chewable tablet Chew 1 tablet by mouth as needed.      cholecalciferol (VITAMIN D) 1000 UNITS tablet Take 2,000 Units by mouth daily.     cyanocobalamin 500 MCG tablet Take 500 mcg by mouth daily as needed (Alternate b/t vitamin b complex).      diphenhydrAMINE (BENADRYL ALLERGY CHILDRENS) 12.5 MG chewable tablet Chew 1 tablet (12.5 mg total) by mouth at bedtime as needed (cramps). 30 tablet 0   levofloxacin (LEVAQUIN) 500 MG tablet Take 1 tablet (500 mg total) by mouth daily. 10 tablet 0   MAGNESIUM PO Take 800 mg by mouth.     meclizine (ANTIVERT) 25 MG tablet Take 1 tablet (25 mg total) by mouth 3 (three) times daily as needed for dizziness. (Patient taking differently: Take 25 mg by mouth as needed for dizziness.) 30 tablet 1   Omega-3 Fatty Acids (FISH OIL PO) Take 1-2 each by mouth daily as needed.     omeprazole (PRILOSEC) 20 MG capsule TAKE 1 CAPSULE BY MOUTH EVERY DAY 30 capsule 11   propranolol (INDERAL) 10 MG tablet take 1 tablet by mouth twice a day (Patient taking differently: Take 10 mg by mouth daily.) 60 tablet 5   Psyllium (METAMUCIL PO) Take by mouth daily. 1 tablespoon     No facility-administered medications prior to visit.    ROS: Review of Systems  Constitutional:  Negative for activity change, appetite change, chills, fatigue and unexpected weight change.  HENT:  Negative for congestion, mouth sores and sinus pressure.   Eyes:  Negative for visual disturbance.  Respiratory:   Negative for cough and chest tightness.   Gastrointestinal:  Negative for abdominal distention, abdominal pain, constipation, nausea and vomiting.  Genitourinary:  Negative for difficulty urinating, frequency and vaginal pain.  Musculoskeletal:  Negative for back pain and gait problem.  Skin:  Negative for pallor and rash.  Neurological:  Negative for dizziness, tremors, weakness, numbness and headaches.  Psychiatric/Behavioral:  Negative for confusion and sleep disturbance.    Objective:  Pulse 87   Temp 98.2 F (36.8 C) (Oral)   Wt 180 lb (81.6 kg)   SpO2 98%   BMI 30.90 kg/m   BP Readings from Last 3 Encounters:  11/07/20 (!) 168/98  08/15/20 (!) 182/106  05/26/19 135/78    Wt Readings from Last 3 Encounters:  05/29/21 180 lb (81.6 kg)  05/14/21 180 lb 6.4 oz (81.8 kg)  03/10/21 182 lb 12.8 oz (82.9 kg)    Physical Exam Constitutional:      General: She is not in acute distress.    Appearance: She is well-developed.  HENT:     Head: Normocephalic.     Right Ear: External ear normal.     Left Ear: External ear normal.     Nose: Nose normal.  Eyes:     General:        Right eye: No discharge.  Left eye: No discharge.     Conjunctiva/sclera: Conjunctivae normal.     Pupils: Pupils are equal, round, and reactive to light.  Neck:     Thyroid: No thyromegaly.     Vascular: No JVD.     Trachea: No tracheal deviation.  Cardiovascular:     Rate and Rhythm: Normal rate and regular rhythm.     Heart sounds: Normal heart sounds.  Pulmonary:     Effort: No respiratory distress.     Breath sounds: No stridor. No wheezing.  Abdominal:     General: Bowel sounds are normal. There is no distension.     Palpations: Abdomen is soft. There is no mass.     Tenderness: There is no abdominal tenderness. There is no guarding or rebound.  Musculoskeletal:        General: No tenderness.     Cervical back: Normal range of motion and neck supple.  Lymphadenopathy:      Cervical: No cervical adenopathy.  Skin:    Findings: No erythema or rash.  Neurological:     Mental Status: She is oriented to person, place, and time.     Cranial Nerves: No cranial nerve deficit.     Motor: No abnormal muscle tone.     Coordination: Coordination normal.     Deep Tendon Reflexes: Reflexes normal.  Psychiatric:        Behavior: Behavior normal.        Thought Content: Thought content normal.        Judgment: Judgment normal.    Lab Results  Component Value Date   WBC 4.4 11/04/2020   HGB 14.6 11/04/2020   HCT 43.4 11/04/2020   PLT 221.0 11/04/2020   GLUCOSE 103 (H) 11/04/2020   CHOL 248 (H) 07/29/2020   TRIG 93.0 07/29/2020   HDL 99.00 07/29/2020   LDLDIRECT 172.9 04/03/2013   LDLCALC 130 (H) 07/29/2020   ALT 20 11/04/2020   AST 24 11/04/2020   NA 138 11/04/2020   K 4.3 11/04/2020   CL 101 11/04/2020   CREATININE 0.82 11/04/2020   BUN 13 11/04/2020   CO2 28 11/04/2020   TSH 3.32 07/29/2020   HGBA1C 5.8 07/29/2020   MICROALBUR <0.7 05/21/2017    No results found.  Assessment & Plan:   Problem List Items Addressed This Visit     B12 deficiency    On B12      Diverticulitis    Improving - finished Levaquin      Diverticulosis of large intestine    Robinul prn cramps Diet        Stress at home    Discussed w/pt. Son had a CVA in 7/22         Follow-up: Return in about 3 months (around 08/29/2021) for a follow-up visit.  Walker Kehr, MD

## 2021-07-07 ENCOUNTER — Other Ambulatory Visit: Payer: Self-pay

## 2021-07-07 ENCOUNTER — Ambulatory Visit (INDEPENDENT_AMBULATORY_CARE_PROVIDER_SITE_OTHER): Payer: Medicare Other

## 2021-07-07 DIAGNOSIS — Z23 Encounter for immunization: Secondary | ICD-10-CM

## 2021-07-07 NOTE — Progress Notes (Signed)
Pt given flu vacc w/o any complications.

## 2021-07-21 ENCOUNTER — Other Ambulatory Visit: Payer: Self-pay

## 2021-07-21 ENCOUNTER — Other Ambulatory Visit (INDEPENDENT_AMBULATORY_CARE_PROVIDER_SITE_OTHER): Payer: Medicare Other

## 2021-07-21 DIAGNOSIS — D582 Other hemoglobinopathies: Secondary | ICD-10-CM | POA: Diagnosis not present

## 2021-07-21 DIAGNOSIS — R252 Cramp and spasm: Secondary | ICD-10-CM

## 2021-07-21 DIAGNOSIS — E785 Hyperlipidemia, unspecified: Secondary | ICD-10-CM

## 2021-07-21 LAB — LIPID PANEL
Cholesterol: 241 mg/dL — ABNORMAL HIGH (ref 0–200)
HDL: 89 mg/dL (ref >39.00)
LDL Cholesterol: 136 mg/dL — ABNORMAL HIGH (ref 0–99)
NonHDL: 152.38
Total CHOL/HDL Ratio: 3
Triglycerides: 82 mg/dL (ref 0.0–149.0)
VLDL: 16.4 mg/dL (ref 0.0–40.0)

## 2021-07-21 LAB — CBC WITH DIFFERENTIAL/PLATELET
Basophils Absolute: 0 10*3/uL (ref 0.0–0.1)
Basophils Relative: 0.3 % (ref 0.0–3.0)
Eosinophils Absolute: 0.1 10*3/uL (ref 0.0–0.7)
Eosinophils Relative: 1.7 % (ref 0.0–5.0)
HCT: 43 % (ref 36.0–46.0)
Hemoglobin: 14.3 g/dL (ref 12.0–15.0)
Lymphocytes Relative: 30.9 % (ref 12.0–46.0)
Lymphs Abs: 1.2 10*3/uL (ref 0.7–4.0)
MCHC: 33.2 g/dL (ref 30.0–36.0)
MCV: 92.1 fl (ref 78.0–100.0)
Monocytes Absolute: 0.4 10*3/uL (ref 0.1–1.0)
Monocytes Relative: 9 % (ref 3.0–12.0)
Neutro Abs: 2.3 10*3/uL (ref 1.4–7.7)
Neutrophils Relative %: 58.1 % (ref 43.0–77.0)
Platelets: 207 10*3/uL (ref 150.0–400.0)
RBC: 4.67 Mil/uL (ref 3.87–5.11)
RDW: 14 % (ref 11.5–15.5)
WBC: 4 10*3/uL (ref 4.0–10.5)

## 2021-07-21 LAB — COMPREHENSIVE METABOLIC PANEL
ALT: 21 U/L (ref 0–35)
AST: 25 U/L (ref 0–37)
Albumin: 4.5 g/dL (ref 3.5–5.2)
Alkaline Phosphatase: 35 U/L — ABNORMAL LOW (ref 39–117)
BUN: 12 mg/dL (ref 6–23)
CO2: 28 mEq/L (ref 19–32)
Calcium: 10 mg/dL (ref 8.4–10.5)
Chloride: 103 mEq/L (ref 96–112)
Creatinine, Ser: 0.73 mg/dL (ref 0.40–1.20)
GFR: 79.3 mL/min (ref >60.00)
Glucose, Bld: 99 mg/dL (ref 70–99)
Potassium: 4.7 mEq/L (ref 3.5–5.1)
Sodium: 141 mEq/L (ref 135–145)
Total Bilirubin: 1.1 mg/dL (ref 0.2–1.2)
Total Protein: 7.2 g/dL (ref 6.0–8.3)

## 2021-07-21 LAB — URINALYSIS
Bilirubin Urine: NEGATIVE
Hgb urine dipstick: NEGATIVE
Ketones, ur: NEGATIVE
Leukocytes,Ua: NEGATIVE
Nitrite: NEGATIVE
Specific Gravity, Urine: 1.01 (ref 1.000–1.030)
Total Protein, Urine: NEGATIVE
Urine Glucose: NEGATIVE
Urobilinogen, UA: 0.2 (ref 0.0–1.0)
pH: 7.5 (ref 5.0–8.0)

## 2021-07-21 LAB — TSH: TSH: 2.91 u[IU]/mL (ref 0.35–5.50)

## 2021-07-24 ENCOUNTER — Encounter: Payer: Self-pay | Admitting: Internal Medicine

## 2021-07-24 ENCOUNTER — Ambulatory Visit (INDEPENDENT_AMBULATORY_CARE_PROVIDER_SITE_OTHER): Payer: Medicare Other | Admitting: Internal Medicine

## 2021-07-24 ENCOUNTER — Other Ambulatory Visit: Payer: Self-pay

## 2021-07-24 VITALS — BP 162/94 | HR 72 | Temp 97.9°F | Ht 64.0 in | Wt 169.6 lb

## 2021-07-24 DIAGNOSIS — R4589 Other symptoms and signs involving emotional state: Secondary | ICD-10-CM

## 2021-07-24 DIAGNOSIS — F439 Reaction to severe stress, unspecified: Secondary | ICD-10-CM

## 2021-07-24 DIAGNOSIS — E538 Deficiency of other specified B group vitamins: Secondary | ICD-10-CM | POA: Diagnosis not present

## 2021-07-24 DIAGNOSIS — R002 Palpitations: Secondary | ICD-10-CM | POA: Diagnosis not present

## 2021-07-24 DIAGNOSIS — D582 Other hemoglobinopathies: Secondary | ICD-10-CM

## 2021-07-24 DIAGNOSIS — D751 Secondary polycythemia: Secondary | ICD-10-CM | POA: Diagnosis not present

## 2021-07-24 NOTE — Assessment & Plan Note (Addendum)
Worse Son had a CVA in 7/22. He is in [place], lives by himself. He is drinking. Eimi declined meds Will ref to Psychology - pt declined, she will call

## 2021-07-24 NOTE — Assessment & Plan Note (Signed)
Worse due to stress w/her sick son (he had CVA)

## 2021-07-24 NOTE — Progress Notes (Signed)
Subjective:  Patient ID: Laura Vasquez, female    DOB: 06-Dec-1945  Age: 75 y.o. MRN: [id number]  CC: Follow-up (3 month f/u)   HPI CHERRY TURLINGTON presents for anxiety, vertigo, dyslipidemia f/u C/o stress w/son  Outpatient Medications Prior to Visit  Medication Sig Dispense Refill   aspirin EC 81 MG tablet Take 81 mg by mouth daily.     B Complex Vitamins (B COMPLEX PO) Take 1 tablet by mouth daily as needed.      calcium carbonate (TUMS - DOSED IN MG ELEMENTAL CALCIUM) 500 MG chewable tablet Chew 1 tablet by mouth as needed.      cholecalciferol (VITAMIN D) 1000 UNITS tablet Take 2,000 Units by mouth daily.     cyanocobalamin 500 MCG tablet Take 500 mcg by mouth daily as needed (Alternate b/t vitamin b complex).      diphenhydrAMINE (BENADRYL ALLERGY CHILDRENS) 12.5 MG chewable tablet Chew 1 tablet (12.5 mg total) by mouth at bedtime as needed (cramps). 30 tablet 0   glycopyrrolate (ROBINUL) 2 MG tablet Take 1 tablet (2 mg total) by mouth 3 (three) times daily as needed. 90 tablet 3   MAGNESIUM PO Take 800 mg by mouth.     meclizine (ANTIVERT) 25 MG tablet Take 1 tablet (25 mg total) by mouth 3 (three) times daily as needed for dizziness. (Patient taking differently: Take 25 mg by mouth as needed for dizziness.) 30 tablet 1   Omega-3 Fatty Acids (FISH OIL PO) Take 1-2 each by mouth daily as needed.     omeprazole (PRILOSEC) 20 MG capsule TAKE 1 CAPSULE BY MOUTH EVERY DAY 30 capsule 11   propranolol (INDERAL) 10 MG tablet take 1 tablet by mouth twice a day (Patient taking differently: Take 10 mg by mouth daily.) 60 tablet 5   Psyllium (METAMUCIL PO) Take by mouth daily. 1 tablespoon     levofloxacin (LEVAQUIN) 500 MG tablet Take 1 tablet (500 mg total) by mouth daily. (Patient not taking: Reported on 07/24/2021) 10 tablet 0   No facility-administered medications prior to visit.    ROS: Review of Systems  Constitutional:  Negative for activity change, appetite change, chills, fatigue  and unexpected weight change.  HENT:  Negative for congestion, mouth sores and sinus pressure.   Eyes:  Negative for visual disturbance.  Respiratory:  Negative for cough and chest tightness.   Cardiovascular:  Positive for palpitations.  Gastrointestinal:  Negative for abdominal pain and nausea.  Genitourinary:  Negative for difficulty urinating, frequency and vaginal pain.  Musculoskeletal:  Negative for back pain and gait problem.  Skin:  Negative for pallor and rash.  Neurological:  Positive for dizziness. Negative for tremors, weakness, numbness and headaches.  Psychiatric/Behavioral:  Negative for confusion and sleep disturbance. The patient is nervous/anxious.    Objective:  BP (!) 162/94 (BP Location: Left Arm)   Pulse 72   Temp 97.9 F (36.6 C) (Oral)   Ht 5\' 4"  (1.626 m)   Wt 169 lb 9.6 oz (76.9 kg)   SpO2 98%   BMI 29.11 kg/m   BP Readings from Last 3 Encounters:  07/24/21 (!) 162/94  11/07/20 (!) 168/98  08/15/20 (!) 182/106    Wt Readings from Last 3 Encounters:  07/24/21 169 lb 9.6 oz (76.9 kg)  05/29/21 180 lb (81.6 kg)  05/14/21 180 lb 6.4 oz (81.8 kg)    Physical Exam Constitutional:      General: She is not in acute distress.    Appearance:  She is well-developed.  HENT:     Head: Normocephalic.     Right Ear: External ear normal.     Left Ear: External ear normal.     Nose: Nose normal.  Eyes:     General:        Right eye: No discharge.        Left eye: No discharge.     Conjunctiva/sclera: Conjunctivae normal.     Pupils: Pupils are equal, round, and reactive to light.  Neck:     Thyroid: No thyromegaly.     Vascular: No JVD.     Trachea: No tracheal deviation.  Cardiovascular:     Rate and Rhythm: Normal rate and regular rhythm.     Heart sounds: Normal heart sounds.  Pulmonary:     Effort: No respiratory distress.     Breath sounds: No stridor. No wheezing.  Abdominal:     General: Bowel sounds are normal. There is no distension.      Palpations: Abdomen is soft. There is no mass.     Tenderness: There is no abdominal tenderness. There is no guarding or rebound.  Musculoskeletal:        General: No tenderness.     Cervical back: Normal range of motion and neck supple. No rigidity.  Lymphadenopathy:     Cervical: No cervical adenopathy.  Skin:    Findings: No erythema or rash.  Neurological:     Mental Status: She is oriented to person, place, and time.     Cranial Nerves: No cranial nerve deficit.     Motor: No abnormal muscle tone.     Coordination: Coordination normal.     Deep Tendon Reflexes: Reflexes normal.  Psychiatric:        Behavior: Behavior normal.        Thought Content: Thought content normal.        Judgment: Judgment normal.    Lab Results  Component Value Date   WBC 4.0 07/21/2021   HGB 14.3 07/21/2021   HCT 43.0 07/21/2021   PLT 207.0 07/21/2021   GLUCOSE 99 07/21/2021   CHOL 241 (H) 07/21/2021   TRIG 82.0 07/21/2021   HDL 89.00 07/21/2021   LDLDIRECT 172.9 04/03/2013   LDLCALC 136 (H) 07/21/2021   ALT 21 07/21/2021   AST 25 07/21/2021   NA 141 07/21/2021   K 4.7 07/21/2021   CL 103 07/21/2021   CREATININE 0.73 07/21/2021   BUN 12 07/21/2021   CO2 28 07/21/2021   TSH 2.91 07/21/2021   HGBA1C 5.8 07/29/2020   MICROALBUR <0.7 05/21/2017    No results found.  Assessment & Plan:   Problem List Items Addressed This Visit     B12 deficiency    Cont w/B12      Depressed mood    Worse due to stress w/her sick son (he had CVA)      Elevated hemoglobin (HCC)    Check CBC      Palpitations    Chronic. On Inderal prn      Polycythemia    Monitor CBC      Stress at home - Primary    Worse Son had a CVA in 7/22. He is in [place], lives by himself. He is drinking. Tomasina declined meds Will ref to Psychology - pt declined, she will call         No orders of the defined types were placed in this encounter.     Follow-up: No follow-ups on  file.  Walker Kehr,  MD

## 2021-07-24 NOTE — Assessment & Plan Note (Signed)
Monitor CBC 

## 2021-07-24 NOTE — Assessment & Plan Note (Signed)
Chronic. On Inderal prn

## 2021-07-24 NOTE — Patient Instructions (Signed)
[place] at [employer] / Find a [place] Near You / [place] at [employer] are also available at some [employer] locations. For more information about locations or to schedule an appointment, please call [phone].  Contact [address] [place], [place]: [phone] Fax: [phone] [place] [facility] Now taking new patients! Services Behavioral Medicine Providers  Rainey Pines, PhD Clarice Pole, PsyD Royetta Crochet, PhD [facility], MS, SSP, LPA Laroy Apple, PhD Buena Irish, LCSW Conception Chancy, PsyD Jan Fireman, [facility], [place] Apolonio Schneiders, PhD

## 2021-07-24 NOTE — Assessment & Plan Note (Signed)
Cont w/B12 

## 2021-07-24 NOTE — Assessment & Plan Note (Signed)
Check CBC 

## 2021-08-03 ENCOUNTER — Encounter: Payer: Self-pay | Admitting: Internal Medicine

## 2021-09-02 ENCOUNTER — Ambulatory Visit (INDEPENDENT_AMBULATORY_CARE_PROVIDER_SITE_OTHER): Payer: Medicare Other | Admitting: Internal Medicine

## 2021-09-02 ENCOUNTER — Other Ambulatory Visit: Payer: Self-pay

## 2021-09-02 ENCOUNTER — Encounter: Payer: Self-pay | Admitting: Internal Medicine

## 2021-09-02 DIAGNOSIS — F419 Anxiety disorder, unspecified: Secondary | ICD-10-CM

## 2021-09-02 DIAGNOSIS — R4589 Other symptoms and signs involving emotional state: Secondary | ICD-10-CM

## 2021-09-02 DIAGNOSIS — D582 Other hemoglobinopathies: Secondary | ICD-10-CM

## 2021-09-02 DIAGNOSIS — R002 Palpitations: Secondary | ICD-10-CM | POA: Diagnosis not present

## 2021-09-02 DIAGNOSIS — M19049 Primary osteoarthritis, unspecified hand: Secondary | ICD-10-CM

## 2021-09-02 MED ORDER — PROPRANOLOL HCL 10 MG PO TABS: 10.0000 mg | ORAL_TABLET | Freq: Two times a day (BID) | ORAL | 5 refills | Status: DC

## 2021-09-02 NOTE — Assessment & Plan Note (Signed)
Use Voltaren gel

## 2021-09-02 NOTE — Assessment & Plan Note (Signed)
Recurrent SVT - mild, anxiety Use Inderal prn

## 2021-09-02 NOTE — Progress Notes (Signed)
Subjective:  Patient ID: Laura Vasquez, female    DOB: 1945-09-01  Age: 76 y.o. MRN: [id number]  CC: Follow-up (3 month f/u) and Medication Refill (Need refill on Propanolol would like written rx to take)   HPI Laura Vasquez presents for fatigue, stress, palpitations  Outpatient Medications Prior to Visit  Medication Sig Dispense Refill   aspirin EC 81 MG tablet Take 81 mg by mouth daily.     B Complex Vitamins (B COMPLEX PO) Take 1 tablet by mouth daily as needed.      calcium carbonate (TUMS - DOSED IN MG ELEMENTAL CALCIUM) 500 MG chewable tablet Chew 1 tablet by mouth as needed.      cholecalciferol (VITAMIN D) 1000 UNITS tablet Take 2,000 Units by mouth daily.     cyanocobalamin 500 MCG tablet Take 500 mcg by mouth daily as needed (Alternate b/t vitamin b complex).      diphenhydrAMINE (BENADRYL ALLERGY CHILDRENS) 12.5 MG chewable tablet Chew 1 tablet (12.5 mg total) by mouth at bedtime as needed (cramps). 30 tablet 0   glycopyrrolate (ROBINUL) 2 MG tablet Take 1 tablet (2 mg total) by mouth 3 (three) times daily as needed. 90 tablet 3   MAGNESIUM PO Take 800 mg by mouth.     meclizine (ANTIVERT) 25 MG tablet Take 1 tablet (25 mg total) by mouth 3 (three) times daily as needed for dizziness. (Patient taking differently: Take 25 mg by mouth as needed for dizziness.) 30 tablet 1   Omega-3 Fatty Acids (FISH OIL PO) Take 1-2 each by mouth daily as needed.     omeprazole (PRILOSEC) 20 MG capsule TAKE 1 CAPSULE BY MOUTH EVERY DAY 30 capsule 11   Psyllium (METAMUCIL PO) Take by mouth daily. 1 tablespoon     propranolol (INDERAL) 10 MG tablet take 1 tablet by mouth twice a day (Patient taking differently: Take 10 mg by mouth daily.) 60 tablet 5   No facility-administered medications prior to visit.    ROS: Review of Systems  Constitutional:  Positive for fatigue. Negative for activity change, appetite change, chills and unexpected weight change.  HENT:  Negative for congestion, mouth  sores and sinus pressure.   Eyes:  Negative for visual disturbance.  Respiratory:  Negative for cough and chest tightness.   Gastrointestinal:  Negative for abdominal pain and nausea.  Genitourinary:  Negative for difficulty urinating, frequency and vaginal pain.  Musculoskeletal:  Negative for back pain and gait problem.  Skin:  Negative for pallor and rash.  Neurological:  Negative for dizziness, tremors, weakness, numbness and headaches.  Psychiatric/Behavioral:  Positive for dysphoric mood. Negative for confusion, sleep disturbance and suicidal ideas. The patient is nervous/anxious.    Objective:  Pulse 62    Temp 98.1 F (36.7 C) (Oral)    Ht 5\' 4"  (1.626 m)    Wt 173 lb 12.8 oz (78.8 kg)    SpO2 97%    BMI 29.83 kg/m   BP Readings from Last 3 Encounters:  07/24/21 (!) 162/94  11/07/20 (!) 168/98  08/15/20 (!) 182/106    Wt Readings from Last 3 Encounters:  09/02/21 173 lb 12.8 oz (78.8 kg)  07/24/21 169 lb 9.6 oz (76.9 kg)  05/29/21 180 lb (81.6 kg)    Physical Exam Constitutional:      General: She is not in acute distress.    Appearance: She is well-developed.  HENT:     Head: Normocephalic.     Right Ear: External ear normal.  Left Ear: External ear normal.     Nose: Nose normal.  Eyes:     General:        Right eye: No discharge.        Left eye: No discharge.     Conjunctiva/sclera: Conjunctivae normal.     Pupils: Pupils are equal, round, and reactive to light.  Neck:     Thyroid: No thyromegaly.     Vascular: No JVD.     Trachea: No tracheal deviation.  Cardiovascular:     Rate and Rhythm: Normal rate and regular rhythm.     Heart sounds: Normal heart sounds.  Pulmonary:     Effort: No respiratory distress.     Breath sounds: No stridor. No wheezing.  Abdominal:     General: Bowel sounds are normal. There is no distension.     Palpations: Abdomen is soft. There is no mass.     Tenderness: There is no abdominal tenderness. There is no guarding or  rebound.  Musculoskeletal:        General: No tenderness.     Cervical back: Normal range of motion and neck supple. No rigidity.  Lymphadenopathy:     Cervical: No cervical adenopathy.  Skin:    Findings: No erythema or rash.  Neurological:     Mental Status: She is oriented to person, place, and time.     Cranial Nerves: No cranial nerve deficit.     Motor: No abnormal muscle tone.     Coordination: Coordination normal.     Deep Tendon Reflexes: Reflexes normal.  Psychiatric:        Behavior: Behavior normal.        Thought Content: Thought content normal.        Judgment: Judgment normal.    Lab Results  Component Value Date   WBC 4.0 07/21/2021   HGB 14.3 07/21/2021   HCT 43.0 07/21/2021   PLT 207.0 07/21/2021   GLUCOSE 99 07/21/2021   CHOL 241 (H) 07/21/2021   TRIG 82.0 07/21/2021   HDL 89.00 07/21/2021   LDLDIRECT 172.9 04/03/2013   LDLCALC 136 (H) 07/21/2021   ALT 21 07/21/2021   AST 25 07/21/2021   NA 141 07/21/2021   K 4.7 07/21/2021   CL 103 07/21/2021   CREATININE 0.73 07/21/2021   BUN 12 07/21/2021   CO2 28 07/21/2021   TSH 2.91 07/21/2021   HGBA1C 5.8 07/29/2020   MICROALBUR <0.7 05/21/2017    No results found.  Assessment & Plan:   Problem List Items Addressed This Visit     Anxiety    Stress discussed      Relevant Orders   Urinalysis   Depressed mood    Due to stress w/her sick son (he had CVA). Discussed      Elevated hemoglobin (HCC)    Monitor CBC      Relevant Orders   Comprehensive metabolic panel   CBC with Differential/Platelet   Hand arthritis    Use Voltaren gel      Palpitations    Recurrent SVT - mild, anxiety Use Inderal prn      Relevant Orders   Comprehensive metabolic panel   CBC with Differential/Platelet      Meds ordered this encounter  Medications   propranolol (INDERAL) 10 MG tablet    Sig: Take 1 tablet (10 mg total) by mouth 2 (two) times daily.    Dispense:  60 tablet    Refill:  5  Follow-up: Return in about 4 months (around 12/31/2021) for a follow-up visit.  Walker Kehr, MD

## 2021-09-02 NOTE — Assessment & Plan Note (Signed)
Monitor CBC 

## 2021-09-02 NOTE — Assessment & Plan Note (Signed)
Stress discussed 

## 2021-09-02 NOTE — Assessment & Plan Note (Signed)
Due to stress w/her sick son (he had CVA). Discussed

## 2021-09-02 NOTE — Patient Instructions (Signed)
Use Voltaren gel

## 2021-10-28 ENCOUNTER — Ambulatory Visit (INDEPENDENT_AMBULATORY_CARE_PROVIDER_SITE_OTHER): Payer: Medicare Other | Admitting: Internal Medicine

## 2021-10-28 ENCOUNTER — Encounter: Payer: Self-pay | Admitting: Internal Medicine

## 2021-10-28 ENCOUNTER — Other Ambulatory Visit: Payer: Self-pay

## 2021-10-28 DIAGNOSIS — J069 Acute upper respiratory infection, unspecified: Secondary | ICD-10-CM | POA: Diagnosis not present

## 2021-10-28 MED ORDER — BENZONATATE 100 MG PO CAPS: 100.0000 mg | ORAL_CAPSULE | Freq: Three times a day (TID) | ORAL | 0 refills | Status: DC | PRN

## 2021-10-28 NOTE — Patient Instructions (Signed)
We have sent in tessalon perles to use up to 3 times a day to help reduce the amount of coughing you do. ? ?You can use vaseline on the nose. ? ?I would try claritin or zyrtec to help lower mucus production. ? ? ?

## 2021-10-28 NOTE — Assessment & Plan Note (Signed)
Suspect likely viral infection. She did take z-pack at onset of symptoms she had leftover. This did help some. She is still having congestion and cough. Advised to start zyrtec. Rx tessalon perles for cough and advised that she could still cough another 1-2 weeks. If any change in breathing or new fevers or new symptoms call or return.  ?

## 2021-10-28 NOTE — Progress Notes (Signed)
? ?  Subjective:  ? ?Patient ID: Laura Vasquez, female    DOB: 09/04/1945, 76 y.o.   MRN: [id number] ? ?HPI ?The patient is a 76 YO female coming in for sinus problems since 10/18/21 and had z-pack at home leftover which she took and finished on 10/23/21. ? ?Review of Systems  ?Constitutional:  Positive for activity change, appetite change and fatigue. Negative for chills, fever and unexpected weight change.  ?HENT:  Positive for congestion, postnasal drip and rhinorrhea. Negative for ear discharge, ear pain, sinus pressure, sinus pain, sneezing, sore throat, tinnitus, trouble swallowing and voice change.   ?Eyes: Negative.   ?Respiratory:  Positive for cough. Negative for chest tightness, shortness of breath and wheezing.   ?Cardiovascular: Negative.   ?Gastrointestinal: Negative.   ?Musculoskeletal:  Negative for myalgias.  ?Neurological: Negative.   ? ?Objective:  ?Physical Exam ?Constitutional:   ?   Appearance: She is well-developed.  ?HENT:  ?   Head: Normocephalic and atraumatic.  ?   Comments: Oropharynx with redness and clear drainage, nose with swollen turbinates, TMs normal bilaterally.  ?Neck:  ?   Thyroid: No thyromegaly.  ?Cardiovascular:  ?   Rate and Rhythm: Normal rate and regular rhythm.  ?Pulmonary:  ?   Effort: Pulmonary effort is normal. No respiratory distress.  ?   Breath sounds: Normal breath sounds. No wheezing or rales.  ?Abdominal:  ?   General: Bowel sounds are normal. There is no distension.  ?   Palpations: Abdomen is soft.  ?   Tenderness: There is no abdominal tenderness. There is no rebound.  ?Musculoskeletal:     ?   General: No tenderness.  ?   Cervical back: Normal range of motion.  ?Lymphadenopathy:  ?   Cervical: No cervical adenopathy.  ?Skin: ?   General: Skin is warm and dry.  ?Neurological:  ?   Mental Status: She is alert and oriented to person, place, and time.  ?   Coordination: Coordination normal.  ? ? ?Vitals:  ? 10/28/21 0857  ?BP: (!) 144/86  ?Pulse: 78  ?Temp: 98.1 ?F  (36.7 ?C)  ?TempSrc: Oral  ?SpO2: 96%  ?Weight: 169 lb 12.8 oz (77 kg)  ?Height: '5\' 4"'$  (1.626 m)  ? ? ?This visit occurred during the SARS-CoV-2 public health emergency.  Safety protocols were in place, including screening questions prior to the visit, additional usage of staff PPE, and extensive cleaning of exam room while observing appropriate contact time as indicated for disinfecting solutions.  ? ?Assessment & Plan:  ? ?

## 2021-12-26 ENCOUNTER — Other Ambulatory Visit: Payer: Medicare Other

## 2022-01-01 ENCOUNTER — Ambulatory Visit: Payer: Medicare Other | Admitting: Internal Medicine

## 2022-01-02 ENCOUNTER — Encounter (HOSPITAL_COMMUNITY): Payer: Self-pay

## 2022-01-02 ENCOUNTER — Other Ambulatory Visit: Payer: Self-pay

## 2022-01-02 ENCOUNTER — Emergency Department (HOSPITAL_COMMUNITY)
Admission: EM | Admit: 2022-01-02 | Discharge: 2022-01-02 | Disposition: A | Discharge status: Home / Self Care | Payer: Medicare Other | Attending: Emergency Medicine | Admitting: Emergency Medicine

## 2022-01-02 DIAGNOSIS — Z79899 Other long term (current) drug therapy: Secondary | ICD-10-CM | POA: Diagnosis not present

## 2022-01-02 DIAGNOSIS — I10 Essential (primary) hypertension: Secondary | ICD-10-CM

## 2022-01-02 DIAGNOSIS — Z7982 Long term (current) use of aspirin: Secondary | ICD-10-CM | POA: Diagnosis not present

## 2022-01-02 LAB — BASIC METABOLIC PANEL
Anion gap: 7 (ref 5–15)
BUN: 13 mg/dL (ref 8–23)
CO2: 27 mmol/L (ref 22–32)
Calcium: 9.6 mg/dL (ref 8.9–10.3)
Chloride: 106 mmol/L (ref 98–111)
Creatinine, Ser: 0.77 mg/dL (ref 0.44–1.00)
GFR, Estimated: >60 mL/min (ref >60)
Glucose, Bld: 103 mg/dL — ABNORMAL HIGH (ref 70–99)
Potassium: 4.7 mmol/L (ref 3.5–5.1)
Sodium: 140 mmol/L (ref 135–145)

## 2022-01-02 LAB — CBC WITH DIFFERENTIAL/PLATELET
Abs Immature Granulocytes: 0.02 10*3/uL (ref 0.00–0.07)
Basophils Absolute: 0 10*3/uL (ref 0.0–0.1)
Basophils Relative: 0 %
Eosinophils Absolute: 0.1 10*3/uL (ref 0.0–0.5)
Eosinophils Relative: 1 %
HCT: 45.8 % (ref 36.0–46.0)
Hemoglobin: 15.2 g/dL — ABNORMAL HIGH (ref 12.0–15.0)
Immature Granulocytes: 0 %
Lymphocytes Relative: 26 %
Lymphs Abs: 1.8 10*3/uL (ref 0.7–4.0)
MCH: 31.2 pg (ref 26.0–34.0)
MCHC: 33.2 g/dL (ref 30.0–36.0)
MCV: 94 fL (ref 80.0–100.0)
Monocytes Absolute: 0.5 10*3/uL (ref 0.1–1.0)
Monocytes Relative: 7 %
Neutro Abs: 4.7 10*3/uL (ref 1.7–7.7)
Neutrophils Relative %: 66 %
Platelets: 236 10*3/uL (ref 150–400)
RBC: 4.87 MIL/uL (ref 3.87–5.11)
RDW: 14.2 % (ref 11.5–15.5)
WBC: 7.1 10*3/uL (ref 4.0–10.5)
nRBC: 0 % (ref 0.0–0.2)

## 2022-01-02 LAB — URINALYSIS, ROUTINE W REFLEX MICROSCOPIC
Bilirubin Urine: NEGATIVE
Glucose, UA: NEGATIVE mg/dL
Hgb urine dipstick: NEGATIVE
Ketones, ur: NEGATIVE mg/dL
Nitrite: NEGATIVE
Protein, ur: NEGATIVE mg/dL
Specific Gravity, Urine: 1.005 (ref 1.005–1.030)
pH: 6 (ref 5.0–8.0)

## 2022-01-02 LAB — TROPONIN I (HIGH SENSITIVITY): Troponin I (High Sensitivity): 2 ng/L (ref <18)

## 2022-01-02 NOTE — ED Provider Triage Note (Signed)
Emergency Medicine Provider Triage Evaluation Note  Laura Vasquez , a 76 y.o. female  was evaluated in triage.  Pt complains of HTN, Vertigo. She has a hx of the same. BP usually improves with rest or propanolol. No improvement today. Feels unsteady. No other neuro sxs.  Review of Systems  Positive: htn Negative: N/v  Physical Exam  BP (!) 186/95 (BP Location: Left Arm)   Pulse (!) 58   Temp (!) 97.5 F (36.4 C) (Oral)   Resp 16   Ht '5\' 4"'$  (1.626 m)   Wt 76.7 kg   SpO2 98%   BMI 29.01 kg/m  Gen:   Awake, no distress   Resp:  Normal effort  MSK:   Moves extremities without difficulty  Other:  No nystgmus, steady gait  Medical Decision Making  Medically screening exam initiated at 11:15 AM.  Appropriate orders placed.  Laura Vasquez was informed that the remainder of the evaluation will be completed by another provider, this initial triage assessment does not replace that evaluation, and the importance of remaining in the ED until their evaluation is complete.  Work up initiated.   Laura Mail, PA-C 01/02/22 1126

## 2022-01-02 NOTE — ED Triage Notes (Signed)
Patient states she has a history of hypertension. Patient states her BP has been normal unless she has been working outside. Patient states she did take her BP med propanolol approx 1 hour ago. Patient states this AM she felt tired and sleepy. "I just felt different." BP in triage-186/95.

## 2022-01-02 NOTE — ED Notes (Signed)
Patient has a urine culture in the main lab 

## 2022-01-02 NOTE — ED Provider Notes (Signed)
[facility] DEPT Provider Note   CSN: [id number] Arrival date & time: 01/02/22  1036     History  Chief Complaint  Patient presents with   Hypertension    Laura Vasquez is a 76 y.o. female.  HPI Patient came in today for evaluation of high blood pressure which she monitors at home.  She has ongoing stress related to her son with a stroke and significant disability, requiring her to be his caregiver for multiple problems.  She describes this as very difficult.  She took her propranolol, shortly before coming into the ED earlier today.  She takes propranolol 1-2 times a day, at her discretion for high blood pressure.  She monitors her blood pressure, frequently.  Her PCP notes her blood pressure tends to run high at times and normal at other times.  He has chosen to maintain her on propranolol, which was initially prescribed by her cardiologist.  She denies headache, persistent vertigo or dizziness, paresthesias, focal weakness or difficulty walking.  She had transient dizziness earlier today while she was working in the garden.  That has not returned.  She has been eating well.    Home Medications Prior to Admission medications   Medication Sig Start Date End Date Taking? Authorizing Provider  aspirin EC 81 MG tablet Take 81 mg by mouth daily.    [provider]  B Complex Vitamins (B COMPLEX PO) Take 1 tablet by mouth daily as needed.     [provider]  benzonatate (TESSALON) 100 MG capsule Take 1 capsule (100 mg total) by mouth 3 (three) times daily as needed for cough. 10/28/21   Hoyt Koch, MD  calcium carbonate (TUMS - DOSED IN MG ELEMENTAL CALCIUM) 500 MG chewable tablet Chew 1 tablet by mouth as needed.     [provider]  cholecalciferol (VITAMIN D) 1000 UNITS tablet Take 2,000 Units by mouth daily.    [provider]  cyanocobalamin 500 MCG tablet Take 500 mcg by mouth daily as needed (Alternate b/t  vitamin b complex).     [provider]  diphenhydrAMINE (BENADRYL ALLERGY CHILDRENS) 12.5 MG chewable tablet Chew 1 tablet (12.5 mg total) by mouth at bedtime as needed (cramps). Patient not taking: Reported on 10/28/2021 03/10/21   Plotnikov, Evie Lacks, MD  glycopyrrolate (ROBINUL) 2 MG tablet Take 1 tablet (2 mg total) by mouth 3 (three) times daily as needed. Patient not taking: Reported on 10/28/2021 05/29/21   Plotnikov, Evie Lacks, MD  MAGNESIUM PO Take 800 mg by mouth.    [provider]  meclizine (ANTIVERT) 25 MG tablet Take 1 tablet (25 mg total) by mouth 3 (three) times daily as needed for dizziness. Patient not taking: Reported on 10/28/2021 05/26/18   Plotnikov, Evie Lacks, MD  Omega-3 Fatty Acids (FISH OIL PO) Take 1-2 each by mouth daily as needed. Patient not taking: Reported on 10/28/2021    [provider]  omeprazole (PRILOSEC) 20 MG capsule TAKE 1 CAPSULE BY MOUTH EVERY DAY 03/10/21   Plotnikov, Evie Lacks, MD  propranolol (INDERAL) 10 MG tablet Take 1 tablet (10 mg total) by mouth 2 (two) times daily. 09/02/21   Plotnikov, Evie Lacks, MD  Psyllium (METAMUCIL PO) Take by mouth daily. 1 tablespoon    [provider]      Allergies    Amoxicillin-pot clavulanate, Ceftin [cefuroxime axetil], Codeine, Naproxen, and Sulfonamide derivatives    Review of Systems   Review of Systems  Physical Exam  Updated Vital Signs BP (!) 195/119 (BP Location: Left Arm)   Pulse 60   Temp (!) 97.5 F (36.4 C) (Oral)   Resp 18   Ht '5\' 4"'$  (1.626 m)   Wt 76.7 kg   SpO2 98%   BMI 29.01 kg/m  Physical Exam Vitals and nursing note reviewed.  Constitutional:      General: She is not in acute distress.    Appearance: She is well-developed. She is not ill-appearing, toxic-appearing or diaphoretic.  HENT:     Head: Normocephalic and atraumatic.     Right Ear: External ear normal.     Left Ear: External ear normal.  Eyes:     Conjunctiva/sclera: Conjunctivae  normal.     Pupils: Pupils are equal, round, and reactive to light.  Neck:     Trachea: Phonation normal.  Cardiovascular:     Rate and Rhythm: Normal rate and regular rhythm.  Pulmonary:     Effort: Pulmonary effort is normal. No respiratory distress.     Breath sounds: No stridor.  Abdominal:     General: There is no distension.  Musculoskeletal:        General: Normal range of motion.     Cervical back: Normal range of motion and neck supple. No rigidity or tenderness.  Skin:    General: Skin is warm and dry.  Neurological:     Mental Status: She is alert and oriented to person, place, and time.     Cranial Nerves: No cranial nerve deficit.     Sensory: No sensory deficit.     Motor: No abnormal muscle tone.     Coordination: Coordination normal.     Comments: No dysarthria or aphasia.  Psychiatric:        Mood and Affect: Mood normal.        Behavior: Behavior normal.        Thought Content: Thought content normal.        Judgment: Judgment normal.    ED Results / Procedures / Treatments   Labs (all labs ordered are listed, but only abnormal results are displayed) Labs Reviewed  BASIC METABOLIC PANEL - Abnormal; Notable for the following components:      Result Value   Glucose, Bld 103 (*)    All other components within normal limits  CBC WITH DIFFERENTIAL/PLATELET - Abnormal; Notable for the following components:   Hemoglobin 15.2 (*)    All other components within normal limits  URINALYSIS, ROUTINE W REFLEX MICROSCOPIC - Abnormal; Notable for the following components:   Leukocytes,Ua TRACE (*)    Bacteria, UA RARE (*)    All other components within normal limits  TROPONIN I (HIGH SENSITIVITY)  TROPONIN I (HIGH SENSITIVITY)    EKG EKG Interpretation  Date/Time:  Friday Jan 02 2022 12:12:10 EDT Ventricular Rate:  45 PR Interval:  173 QRS Duration: 89 QT Interval:  499 QTC Calculation: 432 R Axis:   18 Text Interpretation: Sinus bradycardia Low voltage,  precordial leads Borderline T abnormalities, anterior leads Since last tracing rate slower Otherwise no significant change Confirmed by Daleen Bo [id number]) on 01/02/2022 7:42:27 PM  Radiology No results found.  Procedures Procedures    Medications Ordered in ED Medications - No data to display  ED Course/ Medical Decision Making/ A&P                           Medical Decision Making Patient presenting with high blood pressure,  and transient dizziness happened earlier today.  Dizziness did not return.  Patient was extremely agitated and mad when I saw her because of a 8-hour wait to be evaluated.  After talking with her for a while she became more comfortable and conversant.  Considered ordering additional testing with CAT scan however deferred because she does not have persistent neurologic symptoms.  Problems Addressed: Hypertension, unspecified type: chronic illness or injury    Details: Patient describes intermittent periods of high blood pressure which she monitors closely at home.  Amount and/or Complexity of Data Reviewed Independent Historian:     Details: Patient is cogent historian Labs: ordered.    Details: CBC, metabolic panel, urinalysis, troponin-Labs essentially normal  Risk Decision regarding hospitalization. Risk Details: Patient presenting for evaluation of high blood pressure and transient dizziness.  She does not have signs or symptoms of hypertensive urgency, or hemodynamic instability.  She had transiently low heart rate of 45, likely related to use of propranolol for hypertension.  She does not require further intervention with medication changes, additional ED evaluation including CT, or hospitalization.  Plan to have her follow-up with her PCP to discuss ongoing blood pressure and consider further care or maintenance on usual medications.           Final Clinical Impression(s) / ED Diagnoses Final diagnoses:  Hypertension, unspecified type    Rx  / DC Orders ED Discharge Orders     None         Daleen Bo, MD 01/02/22 2028

## 2022-01-02 NOTE — Discharge Instructions (Signed)
The testing today is reassuring.  It does not appear you are having a stroke or other complications from high blood pressure.  There is a good chance your blood pressure will improve after you go home and rest.  Continue taking your usual prescription medication.  Make sure you are staying on a low-salt diet.  Call your PCP for a follow-up appointment to be seen for further care and treatment as needed.  Return here if you develop concerning symptoms.

## 2022-01-06 ENCOUNTER — Ambulatory Visit: Payer: Medicare Other | Admitting: Internal Medicine

## 2022-01-08 ENCOUNTER — Other Ambulatory Visit (INDEPENDENT_AMBULATORY_CARE_PROVIDER_SITE_OTHER): Payer: Medicare Other

## 2022-01-08 DIAGNOSIS — D582 Other hemoglobinopathies: Secondary | ICD-10-CM

## 2022-01-08 DIAGNOSIS — F419 Anxiety disorder, unspecified: Secondary | ICD-10-CM

## 2022-01-08 DIAGNOSIS — R002 Palpitations: Secondary | ICD-10-CM | POA: Diagnosis not present

## 2022-01-08 LAB — URINALYSIS, ROUTINE W REFLEX MICROSCOPIC
Bilirubin Urine: NEGATIVE
Hgb urine dipstick: NEGATIVE
Ketones, ur: NEGATIVE
Nitrite: NEGATIVE
Specific Gravity, Urine: <=1.005 — AB (ref 1.000–1.030)
Total Protein, Urine: NEGATIVE
Urine Glucose: NEGATIVE
Urobilinogen, UA: 0.2 (ref 0.0–1.0)
pH: 7 (ref 5.0–8.0)

## 2022-01-08 LAB — CBC WITH DIFFERENTIAL/PLATELET
Basophils Absolute: 0 10*3/uL (ref 0.0–0.1)
Basophils Relative: 0.5 % (ref 0.0–3.0)
Eosinophils Absolute: 0.1 10*3/uL (ref 0.0–0.7)
Eosinophils Relative: 2.2 % (ref 0.0–5.0)
HCT: 42.6 % (ref 36.0–46.0)
Hemoglobin: 14 g/dL (ref 12.0–15.0)
Lymphocytes Relative: 29.5 % (ref 12.0–46.0)
Lymphs Abs: 1.2 10*3/uL (ref 0.7–4.0)
MCHC: 32.9 g/dL (ref 30.0–36.0)
MCV: 92.4 fl (ref 78.0–100.0)
Monocytes Absolute: 0.4 10*3/uL (ref 0.1–1.0)
Monocytes Relative: 8.8 % (ref 3.0–12.0)
Neutro Abs: 2.4 10*3/uL (ref 1.4–7.7)
Neutrophils Relative %: 59 % (ref 43.0–77.0)
Platelets: 206 10*3/uL (ref 150.0–400.0)
RBC: 4.61 Mil/uL (ref 3.87–5.11)
RDW: 14.1 % (ref 11.5–15.5)
WBC: 4.1 10*3/uL (ref 4.0–10.5)

## 2022-01-08 LAB — COMPREHENSIVE METABOLIC PANEL
ALT: 22 U/L (ref 0–35)
AST: 26 U/L (ref 0–37)
Albumin: 4.3 g/dL (ref 3.5–5.2)
Alkaline Phosphatase: 33 U/L — ABNORMAL LOW (ref 39–117)
BUN: 13 mg/dL (ref 6–23)
CO2: 30 mEq/L (ref 19–32)
Calcium: 9.8 mg/dL (ref 8.4–10.5)
Chloride: 104 mEq/L (ref 96–112)
Creatinine, Ser: 0.73 mg/dL (ref 0.40–1.20)
GFR: 79.04 mL/min (ref >60.00)
Glucose, Bld: 95 mg/dL (ref 70–99)
Potassium: 4.6 mEq/L (ref 3.5–5.1)
Sodium: 141 mEq/L (ref 135–145)
Total Bilirubin: 0.9 mg/dL (ref 0.2–1.2)
Total Protein: 6.9 g/dL (ref 6.0–8.3)

## 2022-01-14 ENCOUNTER — Ambulatory Visit (INDEPENDENT_AMBULATORY_CARE_PROVIDER_SITE_OTHER): Payer: Medicare Other | Admitting: Internal Medicine

## 2022-01-14 ENCOUNTER — Encounter: Payer: Self-pay | Admitting: Internal Medicine

## 2022-01-14 DIAGNOSIS — E538 Deficiency of other specified B group vitamins: Secondary | ICD-10-CM | POA: Diagnosis not present

## 2022-01-14 DIAGNOSIS — D582 Other hemoglobinopathies: Secondary | ICD-10-CM | POA: Diagnosis not present

## 2022-01-14 DIAGNOSIS — I10 Essential (primary) hypertension: Secondary | ICD-10-CM

## 2022-01-14 NOTE — Assessment & Plan Note (Signed)
Better now On Propranolol ER visit was reviewed

## 2022-01-14 NOTE — Assessment & Plan Note (Signed)
Monitor CBC 

## 2022-01-14 NOTE — Assessment & Plan Note (Signed)
Cont on Vit B12 

## 2022-01-14 NOTE — Progress Notes (Signed)
Subjective:  Patient ID: Laura Vasquez, female    DOB: 1946-05-30  Age: 76 y.o. MRN: [id number]  CC: No chief complaint on file.   HPI Laura Vasquez presents for HTN, stress, vertigo ER visit  Outpatient Medications Prior to Visit  Medication Sig Dispense Refill   aspirin EC 81 MG tablet Take 81 mg by mouth daily.     B Complex Vitamins (B COMPLEX PO) Take 1 tablet by mouth daily as needed.      calcium carbonate (TUMS - DOSED IN MG ELEMENTAL CALCIUM) 500 MG chewable tablet Chew 1 tablet by mouth as needed.      cholecalciferol (VITAMIN D) 1000 UNITS tablet Take 2,000 Units by mouth daily.     cyanocobalamin 500 MCG tablet Take 500 mcg by mouth daily as needed (Alternate b/t vitamin b complex).      diphenhydrAMINE (BENADRYL ALLERGY CHILDRENS) 12.5 MG chewable tablet Chew 1 tablet (12.5 mg total) by mouth at bedtime as needed (cramps). 30 tablet 0   glycopyrrolate (ROBINUL) 2 MG tablet Take 1 tablet (2 mg total) by mouth 3 (three) times daily as needed. 90 tablet 3   MAGNESIUM PO Take 800 mg by mouth.     meclizine (ANTIVERT) 25 MG tablet Take 1 tablet (25 mg total) by mouth 3 (three) times daily as needed for dizziness. 30 tablet 1   Omega-3 Fatty Acids (FISH OIL PO) Take 1-2 each by mouth daily as needed.     omeprazole (PRILOSEC) 20 MG capsule TAKE 1 CAPSULE BY MOUTH EVERY DAY 30 capsule 11   propranolol (INDERAL) 10 MG tablet Take 1 tablet (10 mg total) by mouth 2 (two) times daily. 60 tablet 5   Psyllium (METAMUCIL PO) Take by mouth daily. 1 tablespoon     benzonatate (TESSALON) 100 MG capsule Take 1 capsule (100 mg total) by mouth 3 (three) times daily as needed for cough. 45 capsule 0   No facility-administered medications prior to visit.    ROS: Review of Systems  Constitutional:  Negative for activity change, appetite change, chills, fatigue and unexpected weight change.  HENT:  Negative for congestion, mouth sores and sinus pressure.   Eyes:  Negative for visual  disturbance.  Respiratory:  Negative for cough and chest tightness.   Gastrointestinal:  Negative for abdominal pain and nausea.  Genitourinary:  Negative for difficulty urinating, frequency and vaginal pain.  Musculoskeletal:  Negative for back pain and gait problem.  Skin:  Negative for pallor and rash.  Neurological:  Negative for dizziness, tremors, weakness, numbness and headaches.  Psychiatric/Behavioral:  Negative for confusion, sleep disturbance and suicidal ideas. The patient is nervous/anxious.    Objective:  BP 122/90 (BP Location: Left Arm, Patient Position: Sitting, Cuff Size: Large)   Pulse 71   Temp 98.7 F (37.1 C) (Oral)   Ht '5\' 4"'$  (1.626 m)   Wt 162 lb (73.5 kg)   SpO2 96%   BMI 27.81 kg/m   BP Readings from Last 3 Encounters:  01/14/22 122/90  01/02/22 (!) 195/119  10/28/21 122/84    Wt Readings from Last 3 Encounters:  01/14/22 162 lb (73.5 kg)  01/02/22 169 lb (76.7 kg)  10/28/21 169 lb 12.8 oz (77 kg)    Physical Exam Constitutional:      General: She is not in acute distress.    Appearance: She is well-developed. She is obese.  HENT:     Head: Normocephalic.     Right Ear: External ear normal.  Left Ear: External ear normal.     Nose: Nose normal.  Eyes:     General:        Right eye: No discharge.        Left eye: No discharge.     Conjunctiva/sclera: Conjunctivae normal.     Pupils: Pupils are equal, round, and reactive to light.  Neck:     Thyroid: No thyromegaly.     Vascular: No JVD.     Trachea: No tracheal deviation.  Cardiovascular:     Rate and Rhythm: Normal rate and regular rhythm.     Heart sounds: Normal heart sounds.  Pulmonary:     Effort: No respiratory distress.     Breath sounds: No stridor. No wheezing.  Abdominal:     General: Bowel sounds are normal. There is no distension.     Palpations: Abdomen is soft. There is no mass.     Tenderness: There is no abdominal tenderness. There is no guarding or rebound.   Musculoskeletal:        General: No tenderness.     Cervical back: Normal range of motion and neck supple. No rigidity.  Lymphadenopathy:     Cervical: No cervical adenopathy.  Skin:    Findings: No erythema or rash.  Neurological:     Mental Status: She is oriented to person, place, and time.     Cranial Nerves: No cranial nerve deficit.     Motor: No abnormal muscle tone.     Coordination: Coordination normal.     Deep Tendon Reflexes: Reflexes normal.  Psychiatric:        Behavior: Behavior normal.        Thought Content: Thought content normal.        Judgment: Judgment normal.    Lab Results  Component Value Date   WBC 4.1 01/08/2022   HGB 14.0 01/08/2022   HCT 42.6 01/08/2022   PLT 206.0 01/08/2022   GLUCOSE 95 01/08/2022   CHOL 241 (H) 07/21/2021   TRIG 82.0 07/21/2021   HDL 89.00 07/21/2021   LDLDIRECT 172.9 04/03/2013   LDLCALC 136 (H) 07/21/2021   ALT 22 01/08/2022   AST 26 01/08/2022   NA 141 01/08/2022   K 4.6 01/08/2022   CL 104 01/08/2022   CREATININE 0.73 01/08/2022   BUN 13 01/08/2022   CO2 30 01/08/2022   TSH 2.91 07/21/2021   HGBA1C 5.8 07/29/2020   MICROALBUR <0.7 05/21/2017    No results found.  Assessment & Plan:   Problem List Items Addressed This Visit     B12 deficiency    Cont on Vit B12      Elevated hemoglobin (HCC)    Monitor CBC       Essential hypertension    Better now On Propranolol ER visit was reviewed         No orders of the defined types were placed in this encounter.     Follow-up: Return in about 3 months (around 04/16/2022) for a follow-up visit.  Walker Kehr, MD

## 2022-01-14 NOTE — Patient Instructions (Addendum)
[facility] located just off [address] at [place] ----------------------------------------------------------------------------------------------------- [facility] Emergency Department at [facility] [place],  [place]  [address] Get Driving Directions

## 2022-04-21 ENCOUNTER — Ambulatory Visit: Payer: Medicare Other | Admitting: Internal Medicine

## 2022-04-28 ENCOUNTER — Ambulatory Visit: Payer: Medicare Other

## 2022-04-28 NOTE — Progress Notes (Signed)
NO CHARGE

## 2022-04-30 ENCOUNTER — Ambulatory Visit (INDEPENDENT_AMBULATORY_CARE_PROVIDER_SITE_OTHER): Payer: Medicare Other | Admitting: Internal Medicine

## 2022-04-30 ENCOUNTER — Encounter: Payer: Self-pay | Admitting: Internal Medicine

## 2022-04-30 DIAGNOSIS — I10 Essential (primary) hypertension: Secondary | ICD-10-CM | POA: Diagnosis not present

## 2022-04-30 DIAGNOSIS — Z23 Encounter for immunization: Secondary | ICD-10-CM

## 2022-04-30 DIAGNOSIS — D582 Other hemoglobinopathies: Secondary | ICD-10-CM | POA: Diagnosis not present

## 2022-04-30 DIAGNOSIS — F439 Reaction to severe stress, unspecified: Secondary | ICD-10-CM

## 2022-04-30 DIAGNOSIS — F419 Anxiety disorder, unspecified: Secondary | ICD-10-CM

## 2022-04-30 DIAGNOSIS — E538 Deficiency of other specified B group vitamins: Secondary | ICD-10-CM

## 2022-04-30 DIAGNOSIS — R4589 Other symptoms and signs involving emotional state: Secondary | ICD-10-CM

## 2022-04-30 NOTE — Progress Notes (Signed)
Subjective:  Patient ID: Laura Vasquez, female    DOB: 06-01-1946  Age: 76 y.o. MRN: [id number]  CC: Follow-up (3 month f/u- Pt want flu but Regular not High Dose)   HPI Laura Vasquez presents for stress w/son, B 12 def, HTN  Outpatient Medications Prior to Visit  Medication Sig Dispense Refill  . aspirin EC 81 MG tablet Take 81 mg by mouth daily.    . B Complex Vitamins (B COMPLEX PO) Take 1 tablet by mouth daily as needed.     . calcium carbonate (TUMS - DOSED IN MG ELEMENTAL CALCIUM) 500 MG chewable tablet Chew 1 tablet by mouth as needed.     . cholecalciferol (VITAMIN D) 1000 UNITS tablet Take 2,000 Units by mouth daily.    . cyanocobalamin 500 MCG tablet Take 500 mcg by mouth daily as needed (Alternate b/t vitamin b complex).     . diphenhydrAMINE (BENADRYL ALLERGY CHILDRENS) 12.5 MG chewable tablet Chew 1 tablet (12.5 mg total) by mouth at bedtime as needed (cramps). 30 tablet 0  . MAGNESIUM PO Take 800 mg by mouth.    . meclizine (ANTIVERT) 25 MG tablet Take 1 tablet (25 mg total) by mouth 3 (three) times daily as needed for dizziness. 30 tablet 1  . Omega-3 Fatty Acids (FISH OIL PO) Take 1-2 each by mouth daily as needed.    Marland Kitchen omeprazole (PRILOSEC) 20 MG capsule TAKE 1 CAPSULE BY MOUTH EVERY DAY 30 capsule 11  . propranolol (INDERAL) 10 MG tablet Take 1 tablet (10 mg total) by mouth 2 (two) times daily. 60 tablet 5  . Psyllium (METAMUCIL PO) Take by mouth daily. 1 tablespoon    . glycopyrrolate (ROBINUL) 2 MG tablet Take 1 tablet (2 mg total) by mouth 3 (three) times daily as needed. (Patient not taking: Reported on 04/30/2022) 90 tablet 3   No facility-administered medications prior to visit.    ROS: Review of Systems  Constitutional:  Negative for activity change, appetite change, chills, fatigue and unexpected weight change.  HENT:  Negative for congestion, mouth sores and sinus pressure.   Eyes:  Negative for visual disturbance.  Respiratory:  Negative for cough and  chest tightness.   Gastrointestinal:  Negative for abdominal pain and nausea.  Genitourinary:  Negative for difficulty urinating, frequency and vaginal pain.  Musculoskeletal:  Negative for back pain and gait problem.  Skin:  Negative for pallor and rash.  Neurological:  Negative for dizziness, tremors, weakness, numbness and headaches.  Psychiatric/Behavioral:  Positive for dysphoric mood. Negative for confusion, self-injury, sleep disturbance and suicidal ideas. The patient is nervous/anxious.     Objective:  BP (!) 144/84 (BP Location: Left Arm)   Pulse (!) 53   Temp 98.2 F (36.8 C) (Oral)   Ht '5\' 4"'$  (1.626 m)   Wt 168 lb 9.6 oz (76.5 kg)   SpO2 97%   BMI 28.94 kg/m   BP Readings from Last 3 Encounters:  04/30/22 (!) 144/84  01/14/22 122/90  01/02/22 (!) 195/119    Wt Readings from Last 3 Encounters:  04/30/22 168 lb 9.6 oz (76.5 kg)  01/14/22 162 lb (73.5 kg)  01/02/22 169 lb (76.7 kg)    Physical Exam Constitutional:      General: She is not in acute distress.    Appearance: She is well-developed. She is obese.  HENT:     Head: Normocephalic.     Right Ear: External ear normal.     Left Ear: External ear  normal.     Nose: Nose normal.  Eyes:     General:        Right eye: No discharge.        Left eye: No discharge.     Conjunctiva/sclera: Conjunctivae normal.     Pupils: Pupils are equal, round, and reactive to light.  Neck:     Thyroid: No thyromegaly.     Vascular: No JVD.     Trachea: No tracheal deviation.  Cardiovascular:     Rate and Rhythm: Normal rate and regular rhythm.     Heart sounds: Normal heart sounds.  Pulmonary:     Effort: No respiratory distress.     Breath sounds: No stridor. No wheezing.  Abdominal:     General: Bowel sounds are normal. There is no distension.     Palpations: Abdomen is soft. There is no mass.     Tenderness: There is no abdominal tenderness. There is no guarding or rebound.  Musculoskeletal:        General: No  tenderness.     Cervical back: Normal range of motion and neck supple. No rigidity.  Lymphadenopathy:     Cervical: No cervical adenopathy.  Skin:    Findings: No erythema or rash.  Neurological:     Cranial Nerves: No cranial nerve deficit.     Motor: No abnormal muscle tone.     Coordination: Coordination normal.     Deep Tendon Reflexes: Reflexes normal.  Psychiatric:        Behavior: Behavior normal.        Thought Content: Thought content normal.        Judgment: Judgment normal.  Tearful   Lab Results  Component Value Date   WBC 4.1 01/08/2022   HGB 14.0 01/08/2022   HCT 42.6 01/08/2022   PLT 206.0 01/08/2022   GLUCOSE 95 01/08/2022   CHOL 241 (H) 07/21/2021   TRIG 82.0 07/21/2021   HDL 89.00 07/21/2021   LDLDIRECT 172.9 04/03/2013   LDLCALC 136 (H) 07/21/2021   ALT 22 01/08/2022   AST 26 01/08/2022   NA 141 01/08/2022   K 4.6 01/08/2022   CL 104 01/08/2022   CREATININE 0.73 01/08/2022   BUN 13 01/08/2022   CO2 30 01/08/2022   TSH 2.91 07/21/2021   HGBA1C 5.8 07/29/2020   MICROALBUR <0.7 05/21/2017    No results found.  Assessment & Plan:   Problem List Items Addressed This Visit     Anxiety    [facility] for anxiety [place] Son had a CVA in 7/22 - stressed      B12 deficiency    Chronic  Cont on Vit B12      Elevated hemoglobin (HCC)    Hem ref and phlebotomies offered declined. Baby asa qd option was discussed Check CBC      Essential hypertension    Chronic  BP is nl at home. SBP at home is 100-170, mostly WNL Pt declined additional Rx On Propranolol Cardiac cor calcium CT offered - declined      Stress at home     [place] declined meds Will ref to Psychology - pt declined, she will call Son had a CVA in 7/22 and he is aphasic w/paralysis. He is in [place], lives by himself. He is drinking. [place] declined meds.         No orders of the defined types were placed in this encounter.     Follow-up: Return in about 3 months  (  around 07/30/2022) for a follow-up visit.  Walker Kehr, MD

## 2022-04-30 NOTE — Assessment & Plan Note (Signed)
Worse due to stress w/her sick son (he had CVA), aphasia

## 2022-04-30 NOTE — Assessment & Plan Note (Addendum)
  [place] declined meds Will ref to Psychology - pt declined, she will call Son had a CVA in 7/22 and he is aphasic w/paralysis. He is in [place], lives by himself. He is drinking. [place] declined meds.

## 2022-04-30 NOTE — Assessment & Plan Note (Signed)
Chronic  BP is nl at home. SBP at home is 100-170, mostly WNL Pt declined additional Rx On Propranolol Cardiac cor calcium CT offered - declined

## 2022-04-30 NOTE — Assessment & Plan Note (Signed)
[facility] for anxiety [place] Son had a CVA in 7/22 - stressed

## 2022-04-30 NOTE — Assessment & Plan Note (Signed)
Hem ref and phlebotomies offered declined. Baby asa qd option was discussed Check CBC

## 2022-04-30 NOTE — Assessment & Plan Note (Signed)
Chronic  Cont on Vit B12

## 2022-04-30 NOTE — Patient Instructions (Signed)
  Laura Vasquez "[facility]"  Caleb Popp "[employer]"

## 2022-05-06 ENCOUNTER — Telehealth: Payer: Self-pay | Admitting: Internal Medicine

## 2022-05-06 NOTE — Telephone Encounter (Signed)
Called patient to schedule AWV with [facility]. Patient started yelling at me stating that Dr.Plotnikov told her that an AWV was not that important. Message sent to PCP

## 2022-05-08 NOTE — Telephone Encounter (Signed)
I am sorry.  I did not say AWV was unimportant.  I said that it was optional since she is under a lot of stress with her family situation.  Thanks

## 2022-06-18 ENCOUNTER — Other Ambulatory Visit: Payer: Self-pay | Admitting: Internal Medicine

## 2022-08-03 ENCOUNTER — Ambulatory Visit (INDEPENDENT_AMBULATORY_CARE_PROVIDER_SITE_OTHER): Payer: Medicare Other | Admitting: Internal Medicine

## 2022-08-03 ENCOUNTER — Encounter: Payer: Self-pay | Admitting: Internal Medicine

## 2022-08-03 VITALS — BP 136/82 | HR 58 | Temp 98.3°F | Ht 64.0 in | Wt 168.0 lb

## 2022-08-03 DIAGNOSIS — R739 Hyperglycemia, unspecified: Secondary | ICD-10-CM | POA: Diagnosis not present

## 2022-08-03 DIAGNOSIS — F419 Anxiety disorder, unspecified: Secondary | ICD-10-CM | POA: Diagnosis not present

## 2022-08-03 DIAGNOSIS — E538 Deficiency of other specified B group vitamins: Secondary | ICD-10-CM | POA: Diagnosis not present

## 2022-08-03 DIAGNOSIS — I10 Essential (primary) hypertension: Secondary | ICD-10-CM

## 2022-08-03 DIAGNOSIS — E785 Hyperlipidemia, unspecified: Secondary | ICD-10-CM

## 2022-08-03 NOTE — Addendum Note (Signed)
Addended by: Cassandria Anger on: 08/03/2022 08:58 AM   Modules accepted: Orders

## 2022-08-03 NOTE — Progress Notes (Signed)
Subjective:  Patient ID: Laura Vasquez, female    DOB: [birth year]  Age: 76 y.o. MRN: [id number]  CC: Follow-up (Been having ear ache )   HPI Maurica Omura [facility] presents for HTN, anxiety C/o earache - mild  Outpatient Medications Prior to Visit  Medication Sig Dispense Refill   aspirin EC 81 MG tablet Take 81 mg by mouth daily.     B Complex Vitamins (B COMPLEX PO) Take 1 tablet by mouth daily as needed.      calcium carbonate (TUMS - DOSED IN MG ELEMENTAL CALCIUM) 500 MG chewable tablet Chew 1 tablet by mouth as needed.      cholecalciferol (VITAMIN D) 1000 UNITS tablet Take 2,000 Units by mouth daily.     cyanocobalamin 500 MCG tablet Take 500 mcg by mouth daily as needed (Alternate b/t vitamin b complex).      diphenhydrAMINE (BENADRYL ALLERGY CHILDRENS) 12.5 MG chewable tablet Chew 1 tablet (12.5 mg total) by mouth at bedtime as needed (cramps). 30 tablet 0   MAGNESIUM PO Take 800 mg by mouth.     meclizine (ANTIVERT) 25 MG tablet Take 1 tablet (25 mg total) by mouth 3 (three) times daily as needed for dizziness. 30 tablet 1   Omega-3 Fatty Acids (FISH OIL PO) Take 1-2 each by mouth daily as needed.     omeprazole (PRILOSEC) 20 MG capsule TAKE 1 CAPSULE BY MOUTH EVERY DAY 30 capsule 11   propranolol (INDERAL) 10 MG tablet Take 1 tablet (10 mg total) by mouth 2 (two) times daily. 60 tablet 5   Psyllium (METAMUCIL PO) Take by mouth daily. 1 tablespoon     glycopyrrolate (ROBINUL) 2 MG tablet Take 1 tablet (2 mg total) by mouth 3 (three) times daily as needed. (Patient not taking: Reported on 04/30/2022) 90 tablet 3   No facility-administered medications prior to visit.    ROS: Review of Systems  Constitutional:  Negative for activity change, appetite change, chills, fatigue and unexpected weight change.  HENT:  Negative for congestion, mouth sores and sinus pressure.   Eyes:  Negative for visual disturbance.  Respiratory:  Negative for cough and chest tightness.   Gastrointestinal:   Negative for abdominal pain and nausea.  Genitourinary:  Negative for difficulty urinating, frequency and vaginal pain.  Musculoskeletal:  Negative for back pain and gait problem.  Skin:  Negative for pallor and rash.  Neurological:  Negative for dizziness, tremors, weakness, numbness and headaches.  Psychiatric/Behavioral:  Positive for dysphoric mood. Negative for confusion, sleep disturbance and suicidal ideas. The patient is nervous/anxious.     Objective:  BP 136/82 (BP Location: Left Arm, Patient Position: Sitting, Cuff Size: Normal)   Pulse (!) 58   Temp 98.3 F (36.8 C) (Oral)   Ht '5\' 4"'$  (1.626 m)   Wt 168 lb (76.2 kg)   SpO2 100%   BMI 28.84 kg/m   BP Readings from Last 3 Encounters:  08/03/22 136/82  04/30/22 (!) 144/84  01/14/22 122/90    Wt Readings from Last 3 Encounters:  08/03/22 168 lb (76.2 kg)  04/30/22 168 lb 9.6 oz (76.5 kg)  01/14/22 162 lb (73.5 kg)    Physical Exam Constitutional:      General: She is not in acute distress.    Appearance: She is well-developed. She is obese.  HENT:     Head: Normocephalic.     Right Ear: External ear normal.     Left Ear: External ear normal.  Nose: Nose normal.  Eyes:     General:        Right eye: No discharge.        Left eye: No discharge.     Conjunctiva/sclera: Conjunctivae normal.     Pupils: Pupils are equal, round, and reactive to light.  Neck:     Thyroid: No thyromegaly.     Vascular: No JVD.     Trachea: No tracheal deviation.  Cardiovascular:     Rate and Rhythm: Normal rate and regular rhythm.     Heart sounds: Normal heart sounds.  Pulmonary:     Effort: No respiratory distress.     Breath sounds: No stridor. No wheezing.  Abdominal:     General: Bowel sounds are normal. There is no distension.     Palpations: Abdomen is soft. There is no mass.     Tenderness: There is no abdominal tenderness. There is no guarding or rebound.  Musculoskeletal:        General: No tenderness.      Cervical back: Normal range of motion and neck supple. No rigidity.  Lymphadenopathy:     Cervical: No cervical adenopathy.  Skin:    Findings: No erythema or rash.  Neurological:     Cranial Nerves: No cranial nerve deficit.     Motor: No abnormal muscle tone.     Coordination: Coordination normal.     Deep Tendon Reflexes: Reflexes normal.  Psychiatric:        Behavior: Behavior normal.        Thought Content: Thought content normal.        Judgment: Judgment normal.     Lab Results  Component Value Date   WBC 4.1 01/08/2022   HGB 14.0 01/08/2022   HCT 42.6 01/08/2022   PLT 206.0 01/08/2022   GLUCOSE 95 01/08/2022   CHOL 241 (H) 07/21/2021   TRIG 82.0 07/21/2021   HDL 89.00 07/21/2021   LDLDIRECT 172.9 04/03/2013   LDLCALC 136 (H) 07/21/2021   ALT 22 01/08/2022   AST 26 01/08/2022   NA 141 01/08/2022   K 4.6 01/08/2022   CL 104 01/08/2022   CREATININE 0.73 01/08/2022   BUN 13 01/08/2022   CO2 30 01/08/2022   TSH 2.91 07/21/2021   HGBA1C 5.8 07/29/2020   MICROALBUR <0.7 05/21/2017    No results found.  Assessment & Plan:   Problem List Items Addressed This Visit     Hyperlipidemia   Relevant Orders   Comprehensive metabolic panel   TSH   Lipid panel   Hyperglycemia   Relevant Orders   TSH   Hemoglobin A1c   Essential hypertension - Primary    BP is nl at home. SBP at home is 100-170, mostly WNL Pt declined additional Rx On Propranolol      Relevant Orders   Comprehensive metabolic panel   TSH   Lipid panel   B12 deficiency    On B12      Anxiety    Taking care of her son who had a CVA in 7/22 - stressed         No orders of the defined types were placed in this encounter.     Follow-up: Return in about 4 months (around 12/03/2022) for a follow-up visit.  Walker Kehr, MD

## 2022-08-03 NOTE — Assessment & Plan Note (Signed)
On B12 

## 2022-08-03 NOTE — Assessment & Plan Note (Signed)
BP is nl at home. SBP at home is 100-170, mostly WNL Pt declined additional Rx On Propranolol

## 2022-08-03 NOTE — Assessment & Plan Note (Signed)
Taking care of her son who had a CVA in 7/22 - stressed

## 2022-08-07 ENCOUNTER — Other Ambulatory Visit (INDEPENDENT_AMBULATORY_CARE_PROVIDER_SITE_OTHER): Payer: Medicare Other

## 2022-08-07 DIAGNOSIS — I10 Essential (primary) hypertension: Secondary | ICD-10-CM | POA: Diagnosis not present

## 2022-08-07 DIAGNOSIS — R739 Hyperglycemia, unspecified: Secondary | ICD-10-CM

## 2022-08-07 DIAGNOSIS — F419 Anxiety disorder, unspecified: Secondary | ICD-10-CM | POA: Diagnosis not present

## 2022-08-07 DIAGNOSIS — E785 Hyperlipidemia, unspecified: Secondary | ICD-10-CM | POA: Diagnosis not present

## 2022-08-07 DIAGNOSIS — E538 Deficiency of other specified B group vitamins: Secondary | ICD-10-CM | POA: Diagnosis not present

## 2022-08-07 LAB — URINALYSIS, ROUTINE W REFLEX MICROSCOPIC
Bilirubin Urine: NEGATIVE
Hgb urine dipstick: NEGATIVE
Ketones, ur: NEGATIVE
Nitrite: NEGATIVE
Specific Gravity, Urine: 1.01 (ref 1.000–1.030)
Total Protein, Urine: NEGATIVE
Urine Glucose: NEGATIVE
Urobilinogen, UA: 0.2 (ref 0.0–1.0)
pH: 6.5 (ref 5.0–8.0)

## 2022-08-07 LAB — COMPREHENSIVE METABOLIC PANEL
ALT: 19 U/L (ref 0–35)
AST: 25 U/L (ref 0–37)
Albumin: 4.4 g/dL (ref 3.5–5.2)
Alkaline Phosphatase: 34 U/L — ABNORMAL LOW (ref 39–117)
BUN: 14 mg/dL (ref 6–23)
CO2: 29 mEq/L (ref 19–32)
Calcium: 9.4 mg/dL (ref 8.4–10.5)
Chloride: 101 mEq/L (ref 96–112)
Creatinine, Ser: 0.73 mg/dL (ref 0.40–1.20)
GFR: 78.72 mL/min (ref >60.00)
Glucose, Bld: 93 mg/dL (ref 70–99)
Potassium: 4.1 mEq/L (ref 3.5–5.1)
Sodium: 138 mEq/L (ref 135–145)
Total Bilirubin: 0.9 mg/dL (ref 0.2–1.2)
Total Protein: 6.9 g/dL (ref 6.0–8.3)

## 2022-08-07 LAB — LIPID PANEL
Cholesterol: 239 mg/dL — ABNORMAL HIGH (ref 0–200)
HDL: 97.2 mg/dL (ref >39.00)
LDL Cholesterol: 129 mg/dL — ABNORMAL HIGH (ref 0–99)
NonHDL: 141.82
Total CHOL/HDL Ratio: 2
Triglycerides: 66 mg/dL (ref 0.0–149.0)
VLDL: 13.2 mg/dL (ref 0.0–40.0)

## 2022-08-07 LAB — CBC WITH DIFFERENTIAL/PLATELET
Basophils Absolute: 0 10*3/uL (ref 0.0–0.1)
Basophils Relative: 0.3 % (ref 0.0–3.0)
Eosinophils Absolute: 0.1 10*3/uL (ref 0.0–0.7)
Eosinophils Relative: 2.6 % (ref 0.0–5.0)
HCT: 43.2 % (ref 36.0–46.0)
Hemoglobin: 14.4 g/dL (ref 12.0–15.0)
Lymphocytes Relative: 31.1 % (ref 12.0–46.0)
Lymphs Abs: 1.3 10*3/uL (ref 0.7–4.0)
MCHC: 33.3 g/dL (ref 30.0–36.0)
MCV: 92.3 fl (ref 78.0–100.0)
Monocytes Absolute: 0.4 10*3/uL (ref 0.1–1.0)
Monocytes Relative: 8.4 % (ref 3.0–12.0)
Neutro Abs: 2.5 10*3/uL (ref 1.4–7.7)
Neutrophils Relative %: 57.6 % (ref 43.0–77.0)
Platelets: 228 10*3/uL (ref 150.0–400.0)
RBC: 4.69 Mil/uL (ref 3.87–5.11)
RDW: 13.8 % (ref 11.5–15.5)
WBC: 4.3 10*3/uL (ref 4.0–10.5)

## 2022-08-07 LAB — HEMOGLOBIN A1C: Hgb A1c MFr Bld: 5.9 % (ref 4.6–6.5)

## 2022-08-08 LAB — TSH: TSH: 2.39 u[IU]/mL (ref 0.35–5.50)

## 2022-09-01 ENCOUNTER — Ambulatory Visit (INDEPENDENT_AMBULATORY_CARE_PROVIDER_SITE_OTHER): Payer: Medicare Other | Admitting: Internal Medicine

## 2022-09-01 ENCOUNTER — Encounter: Payer: Self-pay | Admitting: Internal Medicine

## 2022-09-01 VITALS — BP 130/90 | HR 85 | Temp 99.5°F | Ht 64.0 in | Wt 163.0 lb

## 2022-09-01 DIAGNOSIS — J209 Acute bronchitis, unspecified: Secondary | ICD-10-CM | POA: Diagnosis not present

## 2022-09-01 MED ORDER — PROMETHAZINE-DM 6.25-15 MG/5ML PO SYRP: 5.0000 mL | ORAL_SOLUTION | Freq: Four times a day (QID) | ORAL | 0 refills | Status: DC | PRN

## 2022-09-01 MED ORDER — AZITHROMYCIN 250 MG PO TABS: ORAL_TABLET | ORAL | 0 refills | Status: DC

## 2022-09-01 NOTE — Assessment & Plan Note (Signed)
Acute Start a Zpac Prom cough syrup

## 2022-09-01 NOTE — Progress Notes (Signed)
Subjective:  Patient ID: Laura Vasquez, female    DOB: [birth year]  Age: 77 y.o. MRN: [id number]  CC: Nasal Congestion (Tested negative for COVID at home.), Chills, Dizziness (Started Saturday ), and Generalized Body Aches   HPI Laura Vasquez presents for URI x 4-5 d: URI sx's - cough, sinus pain, fever. COVID (-). Coughing a lot, no SOB.  Outpatient Medications Prior to Visit  Medication Sig Dispense Refill   aspirin EC 81 MG tablet Take 81 mg by mouth daily.     B Complex Vitamins (B COMPLEX PO) Take 1 tablet by mouth daily as needed.      calcium carbonate (TUMS - DOSED IN MG ELEMENTAL CALCIUM) 500 MG chewable tablet Chew 1 tablet by mouth as needed.      cholecalciferol (VITAMIN D) 1000 UNITS tablet Take 2,000 Units by mouth daily.     cyanocobalamin 500 MCG tablet Take 500 mcg by mouth daily as needed (Alternate b/t vitamin b complex).      diphenhydrAMINE (BENADRYL ALLERGY CHILDRENS) 12.5 MG chewable tablet Chew 1 tablet (12.5 mg total) by mouth at bedtime as needed (cramps). 30 tablet 0   glycopyrrolate (ROBINUL) 2 MG tablet Take 1 tablet (2 mg total) by mouth 3 (three) times daily as needed. 90 tablet 3   MAGNESIUM PO Take 800 mg by mouth.     meclizine (ANTIVERT) 25 MG tablet Take 1 tablet (25 mg total) by mouth 3 (three) times daily as needed for dizziness. 30 tablet 1   Omega-3 Fatty Acids (FISH OIL PO) Take 1-2 each by mouth daily as needed.     omeprazole (PRILOSEC) 20 MG capsule TAKE 1 CAPSULE BY MOUTH EVERY DAY 30 capsule 11   propranolol (INDERAL) 10 MG tablet Take 1 tablet (10 mg total) by mouth 2 (two) times daily. 60 tablet 5   Psyllium (METAMUCIL PO) Take by mouth daily. 1 tablespoon     No facility-administered medications prior to visit.    ROS: Review of Systems  Constitutional:  Positive for chills, fatigue and fever. Negative for activity change, appetite change and unexpected weight change.  HENT:  Positive for congestion, rhinorrhea, sinus pressure and sinus  pain. Negative for mouth sores.   Eyes:  Negative for visual disturbance.  Respiratory:  Positive for cough. Negative for chest tightness and wheezing.   Gastrointestinal:  Negative for abdominal pain and nausea.  Genitourinary:  Negative for difficulty urinating, frequency and vaginal pain.  Musculoskeletal:  Negative for back pain and gait problem.  Skin:  Negative for pallor and rash.  Neurological:  Negative for dizziness, tremors, weakness, numbness and headaches.  Psychiatric/Behavioral:  Negative for confusion and sleep disturbance.     Objective:  BP (!) 130/90 (BP Location: Right Arm, Patient Position: Sitting, Cuff Size: Normal)   Pulse 85   Temp 99.5 F (37.5 C) (Oral)   Ht '5\' 4"'$  (1.626 m)   Wt 163 lb (73.9 kg)   SpO2 95%   BMI 27.98 kg/m   BP Readings from Last 3 Encounters:  09/01/22 (!) 130/90  08/03/22 136/82  04/30/22 (!) 144/84    Wt Readings from Last 3 Encounters:  09/01/22 163 lb (73.9 kg)  08/03/22 168 lb (76.2 kg)  04/30/22 168 lb 9.6 oz (76.5 kg)    Physical Exam Constitutional:      General: She is not in acute distress.    Appearance: She is well-developed.  HENT:     Head: Normocephalic.     Right  Ear: External ear normal.     Left Ear: External ear normal.     Nose: Nose normal.  Eyes:     General:        Right eye: No discharge.        Left eye: No discharge.     Conjunctiva/sclera: Conjunctivae normal.     Pupils: Pupils are equal, round, and reactive to light.  Neck:     Thyroid: No thyromegaly.     Vascular: No JVD.     Trachea: No tracheal deviation.  Cardiovascular:     Rate and Rhythm: Normal rate and regular rhythm.     Heart sounds: Normal heart sounds.  Pulmonary:     Effort: No respiratory distress.     Breath sounds: No stridor. No wheezing.  Abdominal:     General: Bowel sounds are normal. There is no distension.     Palpations: Abdomen is soft. There is no mass.     Tenderness: There is no abdominal tenderness.  There is no guarding or rebound.  Musculoskeletal:        General: No tenderness.     Cervical back: Normal range of motion and neck supple. No rigidity.  Lymphadenopathy:     Cervical: No cervical adenopathy.  Skin:    Findings: No erythema or rash.  Neurological:     Cranial Nerves: No cranial nerve deficit.     Motor: No abnormal muscle tone.     Coordination: Coordination normal.     Deep Tendon Reflexes: Reflexes normal.  Psychiatric:        Behavior: Behavior normal.        Thought Content: Thought content normal.        Judgment: Judgment normal.     Lab Results  Component Value Date   WBC 4.3 08/07/2022   HGB 14.4 08/07/2022   HCT 43.2 08/07/2022   PLT 228.0 08/07/2022   GLUCOSE 93 08/07/2022   CHOL 239 (H) 08/07/2022   TRIG 66.0 08/07/2022   HDL 97.20 08/07/2022   LDLDIRECT 172.9 04/03/2013   LDLCALC 129 (H) 08/07/2022   ALT 19 08/07/2022   AST 25 08/07/2022   NA 138 08/07/2022   K 4.1 08/07/2022   CL 101 08/07/2022   CREATININE 0.73 08/07/2022   BUN 14 08/07/2022   CO2 29 08/07/2022   TSH 2.39 08/07/2022   HGBA1C 5.9 08/07/2022   MICROALBUR <0.7 05/21/2017    No results found.  Assessment & Plan:   Problem List Items Addressed This Visit       Respiratory   Acute bronchitis - Primary    Acute Start a Zpac Prom cough syrup         Meds ordered this encounter  Medications   promethazine-dextromethorphan (PROMETHAZINE-DM) 6.25-15 MG/5ML syrup    Sig: Take 5 mLs by mouth 4 (four) times daily as needed for cough.    Dispense:  240 mL    Refill:  0   azithromycin (ZITHROMAX Z-PAK) 250 MG tablet    Sig: As directed    Dispense:  6 tablet    Refill:  0      Follow-up: No follow-ups on file.  Walker Kehr, MD

## 2022-10-30 ENCOUNTER — Emergency Department (HOSPITAL_BASED_OUTPATIENT_CLINIC_OR_DEPARTMENT_OTHER)
Admission: EM | Admit: 2022-10-30 | Discharge: 2022-10-30 | Disposition: A | Discharge status: Home / Self Care | Payer: Medicare Other | Attending: Emergency Medicine | Admitting: Emergency Medicine

## 2022-10-30 ENCOUNTER — Other Ambulatory Visit: Payer: Self-pay

## 2022-10-30 ENCOUNTER — Emergency Department (HOSPITAL_BASED_OUTPATIENT_CLINIC_OR_DEPARTMENT_OTHER): Payer: Medicare Other

## 2022-10-30 ENCOUNTER — Emergency Department (HOSPITAL_BASED_OUTPATIENT_CLINIC_OR_DEPARTMENT_OTHER): Payer: Medicare Other | Admitting: Radiology

## 2022-10-30 DIAGNOSIS — Z7982 Long term (current) use of aspirin: Secondary | ICD-10-CM | POA: Insufficient documentation

## 2022-10-30 DIAGNOSIS — S42252A Displaced fracture of greater tuberosity of left humerus, initial encounter for closed fracture: Secondary | ICD-10-CM | POA: Diagnosis not present

## 2022-10-30 DIAGNOSIS — S42212A Unspecified displaced fracture of surgical neck of left humerus, initial encounter for closed fracture: Secondary | ICD-10-CM

## 2022-10-30 DIAGNOSIS — S0181XA Laceration without foreign body of other part of head, initial encounter: Secondary | ICD-10-CM | POA: Diagnosis present

## 2022-10-30 DIAGNOSIS — W0110XA Fall on same level from slipping, tripping and stumbling with subsequent striking against unspecified object, initial encounter: Secondary | ICD-10-CM | POA: Insufficient documentation

## 2022-10-30 DIAGNOSIS — I10 Essential (primary) hypertension: Secondary | ICD-10-CM | POA: Insufficient documentation

## 2022-10-30 MED ORDER — ACETAMINOPHEN 500 MG PO TABS
500.0000 mg | ORAL_TABLET | Freq: Once | ORAL | Status: AC
  Administered 2022-10-30: 500 mg via ORAL
  Filled 2022-10-30: qty 1

## 2022-10-30 MED ORDER — OXYCODONE-ACETAMINOPHEN 5-325 MG PO TABS
1.0000 | ORAL_TABLET | Freq: Once | ORAL | Status: DC
  Filled 2022-10-30: qty 1

## 2022-10-30 NOTE — ED Notes (Signed)
Patient transported to CT 

## 2022-10-30 NOTE — ED Provider Notes (Signed)
[place] Provider Note   CSN: [id number] Arrival date & time: 10/30/22  1232     History  Chief Complaint  Patient presents with   Lytle Michaels    FLOREEN ESKANDER is a 77 y.o. female presents today after fall.  Patient reports she had a fall around noon.  She tripped on a carpeted floor fell on the left side hit her head.  Denies LOC.  Denies nausea vomiting or vision changes.  She has 2 small laceration on the left side of the face no bleeding at the moment.  Patient has tried 5 mg Tylenol PTA with minimal improvement.  She complains of significant left shoulder pain and she cannot move her arm.  Patient is not taking any blood thinner.   Fall      Past Medical History:  Diagnosis Date   Allergy    Anxiety    Arrhythmia    Diverticulosis    Erosive esophagitis    Fatty liver    Fibromyalgia    GERD (gastroesophageal reflux disease)    Hiatal hernia    Hyperlipidemia    Hyperplastic colon polyp    Hypertension    IBS (irritable bowel syndrome)    Mitral regurgitation    Osteoarthritis    Palpitations    Peptic stricture of esophagus    Vitamin B12 deficiency      Home Medications Prior to Admission medications   Medication Sig Start Date End Date Taking? Authorizing Provider  aspirin EC 81 MG tablet Take 81 mg by mouth daily.    [provider]  azithromycin (ZITHROMAX Z-PAK) 250 MG tablet As directed 09/01/22   Plotnikov, Evie Lacks, MD  B Complex Vitamins (B COMPLEX PO) Take 1 tablet by mouth daily as needed.     [provider]  calcium carbonate (TUMS - DOSED IN MG ELEMENTAL CALCIUM) 500 MG chewable tablet Chew 1 tablet by mouth as needed.     [provider]  cholecalciferol (VITAMIN D) 1000 UNITS tablet Take 2,000 Units by mouth daily.    [provider]  cyanocobalamin 500 MCG tablet Take 500 mcg by mouth daily as needed (Alternate b/t vitamin b complex).     [provider]   diphenhydrAMINE (BENADRYL ALLERGY CHILDRENS) 12.5 MG chewable tablet Chew 1 tablet (12.5 mg total) by mouth at bedtime as needed (cramps). 03/10/21   Plotnikov, Evie Lacks, MD  glycopyrrolate (ROBINUL) 2 MG tablet Take 1 tablet (2 mg total) by mouth 3 (three) times daily as needed. 05/29/21   Plotnikov, Evie Lacks, MD  MAGNESIUM PO Take 800 mg by mouth.    [provider]  meclizine (ANTIVERT) 25 MG tablet Take 1 tablet (25 mg total) by mouth 3 (three) times daily as needed for dizziness. 05/26/18   Plotnikov, Evie Lacks, MD  Omega-3 Fatty Acids (FISH OIL PO) Take 1-2 each by mouth daily as needed.    [provider]  omeprazole (PRILOSEC) 20 MG capsule TAKE 1 CAPSULE BY MOUTH EVERY DAY 06/18/22   Plotnikov, Evie Lacks, MD  promethazine-dextromethorphan (PROMETHAZINE-DM) 6.25-15 MG/5ML syrup Take 5 mLs by mouth 4 (four) times daily as needed for cough. 09/01/22   Plotnikov, Evie Lacks, MD  propranolol (INDERAL) 10 MG tablet Take 1 tablet (10 mg total) by mouth 2 (two) times daily. 09/02/21   Plotnikov, Evie Lacks, MD  Psyllium (METAMUCIL PO) Take by mouth daily. 1 tablespoon    [provider]  Allergies    Amoxicillin-pot clavulanate, Ceftin [cefuroxime axetil], Codeine, Naproxen, and Sulfonamide derivatives    Review of Systems   Review of Systems Negative except as per HPI.  Physical Exam Updated Vital Signs BP (!) 151/82 (BP Location: Right Arm)   Pulse 66   Temp (!) 97.4 F (36.3 C) (Oral)   Resp 20   Ht 5\' 4"  (1.626 m)   Wt 76.2 kg   SpO2 97%   BMI 28.84 kg/m  Physical Exam Vitals and nursing note reviewed.  Constitutional:      Appearance: Normal appearance.  HENT:     Head: Normocephalic and atraumatic.     Mouth/Throat:     Mouth: Mucous membranes are moist.  Eyes:     General: No scleral icterus. Cardiovascular:     Rate and Rhythm: Normal rate and regular rhythm.     Pulses: Normal pulses.     Heart sounds: Normal heart sounds.  Pulmonary:      Effort: Pulmonary effort is normal.     Breath sounds: Normal breath sounds.  Abdominal:     General: Abdomen is flat.     Palpations: Abdomen is soft.     Tenderness: There is no abdominal tenderness.  Musculoskeletal:        General: No deformity.     Comments: Limited range of motion of left shoulder due to pain.  Left shoulder is slightly internally rotated.  Skin:    General: Skin is warm.     Findings: No rash.  Neurological:     General: No focal deficit present.     Mental Status: She is alert.  Psychiatric:        Mood and Affect: Mood normal.     ED Results / Procedures / Treatments   Labs (all labs ordered are listed, but only abnormal results are displayed) Labs Reviewed - No data to display  EKG None  Radiology DG Shoulder Left  Result Date: 10/30/2022 CLINICAL DATA:  Fall on left side. EXAM: LEFT SHOULDER - 2+ VIEW COMPARISON:  Left shoulder pain. FINDINGS: There is diffuse decreased bone mineralization. There is a transverse comminuted fracture of the surgical neck of the humerus with minimal approximate 4 mm anterior displacement of the distal fracture component with respect to the proximal fracture component. There is an adjacent comminuted fracture of the greater tuberosity with approximately 6 mm lateral displacement of the peripheral greater tuberosity. Normal glenohumeral and acromioclavicular alignment. Mild moderate calcification within the aortic arch. IMPRESSION: 1. Acute transverse comminuted fracture of the surgical neck of the humerus with minimal anterior displacement of the distal fracture component with respect to the proximal fracture component. 2. Acute comminuted fracture of the greater tuberosity with approximately 6 mm lateral displacement of the peripheral greater tuberosity. Electronically Signed   By: Yvonne Kendall M.D.   On: 10/30/2022 14:03   CT Head Wo Contrast  Result Date: 10/30/2022 CLINICAL DATA:  [place] and hit head. EXAM: CT HEAD  WITHOUT CONTRAST TECHNIQUE: Contiguous axial images were obtained from the base of the skull through the vertex without intravenous contrast. RADIATION DOSE REDUCTION: This exam was performed according to the departmental dose-optimization program which includes automated exposure control, adjustment of the mA and/or kV according to patient size and/or use of iterative reconstruction technique. COMPARISON:  No comparison studies available. FINDINGS: Brain: There is no evidence for acute hemorrhage, hydrocephalus, mass lesion, or abnormal extra-axial fluid collection. No definite CT evidence for acute infarction. Diffuse loss of parenchymal volume  is consistent with atrophy. Patchy low attenuation in the deep hemispheric and periventricular white matter is nonspecific, but likely reflects chronic microvascular ischemic demyelination. Vascular: No hyperdense vessel or unexpected calcification. Skull: No evidence for fracture. No worrisome lytic or sclerotic lesion. Sinuses/Orbits: Air-fluid level noted left sphenoid sinus. Remaining visualized paranasal sinuses are clear. No mastoid effusion. Visualized portions of the globes and intraorbital fat are unremarkable. Other: None IMPRESSION: 1. No acute intracranial abnormality. 2. Atrophy with chronic small vessel ischemic disease. 3. Air-fluid level in the left sphenoid sinus suggests acute sinusitis. Electronically Signed   By: Misty Stanley M.D.   On: 10/30/2022 13:54    Procedures Procedures    Medications Ordered in ED Medications  oxyCODONE-acetaminophen (PERCOCET/ROXICET) 5-325 MG per tablet 1 tablet (1 tablet Oral Patient Refused/Not Given 10/30/22 1349)  acetaminophen (TYLENOL) tablet 500 mg (500 mg Oral Given 10/30/22 1354)    ED Course/ Medical Decision Making/ A&P                             Medical Decision Making Amount and/or Complexity of Data Reviewed Radiology: ordered.   This patient presents to the ED for shoulder pain, this  involves an extensive number of treatment options, and is a complaint that carries with a high risk of complications and morbidity.  The differential diagnosis includes shoulder fracture, dislocation, ICH.  This is not an exhaustive list.  Lab tests:  Imaging studies: I ordered imaging studies. I personally reviewed, interpreted imaging and agree with the radiologist's interpretations. The results include: 1. Acute transverse comminuted fracture of the surgical neck of the humerus with minimal anterior displacement of the distal fracture component with respect to the proximal fracture component. 2. Acute comminuted fracture of the greater tuberosity with approximately 6 mm lateral displacement of the peripheral greater tuberosity.   Problem list/ ED course/ Critical interventions/ Medical management: HPI: See above Vital signs within normal range and stable throughout visit. Laboratory/imaging studies significant for: See above. On physical examination, patient is afebrile and appears in no acute distress.  X-ray of the shoulder show acute transferred comminuted fracture of the surgical neck of the humerus with minimal anterior displacement, acute comminuted fracture of the greater tuberosity with 6 mm lateral displacement of the greater tuberosity.  Percocet ordered for pain.  Shoulder immobilizer ordered.  Advised patient to take Tylenol/ibuprofen for pain, follow-up with orthopedics for further evaluation and management, return to the ER if new or worsening symptoms. I have reviewed the patient home medicines and have made adjustments as needed.  Cardiac monitoring/EKG: The patient was maintained on a cardiac monitor.  I personally reviewed and interpreted the cardiac monitor which showed an underlying rhythm of: sinus rhythm.  Additional history obtained: External records from outside source obtained and reviewed including: Chart review including previous notes, labs, imaging.  Consultations  obtained:  Disposition Continued outpatient therapy. Follow-up with PCP recommended for reevaluation of symptoms. Treatment plan discussed with patient.  Pt acknowledged understanding was agreeable to the plan. Worrisome signs and symptoms were discussed with patient, and patient acknowledged understanding to return to the ED if they noticed these signs and symptoms. Patient was stable upon discharge.   This chart was dictated using voice recognition software.  Despite best efforts to proofread,  errors can occur which can change the documentation meaning.          Final Clinical Impression(s) / ED Diagnoses Final diagnoses:  Closed displaced fracture of  surgical neck of left humerus, unspecified fracture morphology, initial encounter  Closed displaced fracture of greater tuberosity of left humerus, initial encounter    Rx / DC Orders ED Discharge Orders     None         Rex Kras, [place] 10/30/22 [place], DO 10/30/22 1453

## 2022-10-30 NOTE — Discharge Instructions (Addendum)
Your x-ray today showed fracture of the humerus. Take tylenol/ibuprofen or vicodin as needed for pain. I recommend close follow-up with orthopedics for reevaluation.  Please do not hesitate to return to emergency department if worrisome signs symptoms we discussed become apparent.

## 2022-10-30 NOTE — ED Triage Notes (Addendum)
Pt arrived POV. Pt caox4 and ambulatory. Pt reports mechanical fall causing her to fall and hit her head and fall on her L side. Pt denies LOC. Pt does not take blood thinner medication. Pt c/o pain in L shoulder, R hand, and has two small lacerations to the L side of face with no active bleeding.   Pt states she took 500mg  Tylenol just prior to arrival.

## 2022-11-03 ENCOUNTER — Encounter: Payer: Self-pay | Admitting: Internal Medicine

## 2022-11-03 ENCOUNTER — Ambulatory Visit (INDEPENDENT_AMBULATORY_CARE_PROVIDER_SITE_OTHER): Payer: Medicare Other | Admitting: Internal Medicine

## 2022-11-03 VITALS — BP 144/90 | HR 91 | Temp 98.6°F | Ht 64.0 in | Wt 168.0 lb

## 2022-11-03 DIAGNOSIS — S42309A Unspecified fracture of shaft of humerus, unspecified arm, initial encounter for closed fracture: Secondary | ICD-10-CM | POA: Insufficient documentation

## 2022-11-03 DIAGNOSIS — S42212D Unspecified displaced fracture of surgical neck of left humerus, subsequent encounter for fracture with routine healing: Secondary | ICD-10-CM

## 2022-11-03 DIAGNOSIS — J323 Chronic sphenoidal sinusitis: Secondary | ICD-10-CM | POA: Diagnosis not present

## 2022-11-03 DIAGNOSIS — S42212A Unspecified displaced fracture of surgical neck of left humerus, initial encounter for closed fracture: Secondary | ICD-10-CM

## 2022-11-03 DIAGNOSIS — W1800XA Striking against unspecified object with subsequent fall, initial encounter: Secondary | ICD-10-CM | POA: Diagnosis not present

## 2022-11-03 MED ORDER — DOXYCYCLINE HYCLATE 100 MG PO TABS: 100.0000 mg | ORAL_TABLET | Freq: Two times a day (BID) | ORAL | 0 refills | Status: DC

## 2022-11-03 MED ORDER — HYDROCODONE-ACETAMINOPHEN 5-325 MG PO TABS: 1.0000 | ORAL_TABLET | Freq: Four times a day (QID) | ORAL | 0 refills | Status: AC | PRN

## 2022-11-03 NOTE — Assessment & Plan Note (Signed)
10/30/22 S/p a recent fall at [place] office - tripped on the carpet on 3/15. L shoulder w/a fx. Pt went to ER. Pt went to [facility], but left after a long wait. [place] prn Ref to see Dr Onnie Graham

## 2022-11-03 NOTE — Assessment & Plan Note (Signed)
Incidental finding on CT Will treat w/po abx

## 2022-11-03 NOTE — Progress Notes (Signed)
Subjective:  Patient ID: Laura Vasquez, female    DOB: [birth year]  Age: 77 y.o. MRN: [id number]  CC: Shoulder Pain (Pt had a fall and fractured arm)   HPI Laura Vasquez presents for a recent fall at [place] office - tripped on the carpet on 3/15. L shoulder w/a fx. Pt went to ER. She is here w/Jay. Pt went to [facility], but left after a long wait. ER note was reviewed C/o severe pain in the L shoulder  Outpatient Medications Prior to Visit  Medication Sig Dispense Refill   aspirin EC 81 MG tablet Take 81 mg by mouth daily.     B Complex Vitamins (B COMPLEX PO) Take 1 tablet by mouth daily as needed.      calcium carbonate (TUMS - DOSED IN MG ELEMENTAL CALCIUM) 500 MG chewable tablet Chew 1 tablet by mouth as needed.      cholecalciferol (VITAMIN D) 1000 UNITS tablet Take 2,000 Units by mouth daily.     cyanocobalamin 500 MCG tablet Take 500 mcg by mouth daily as needed (Alternate b/t vitamin b complex).      diphenhydrAMINE (BENADRYL ALLERGY CHILDRENS) 12.5 MG chewable tablet Chew 1 tablet (12.5 mg total) by mouth at bedtime as needed (cramps). 30 tablet 0   glycopyrrolate (ROBINUL) 2 MG tablet Take 1 tablet (2 mg total) by mouth 3 (three) times daily as needed. 90 tablet 3   MAGNESIUM PO Take 800 mg by mouth.     meclizine (ANTIVERT) 25 MG tablet Take 1 tablet (25 mg total) by mouth 3 (three) times daily as needed for dizziness. 30 tablet 1   Omega-3 Fatty Acids (FISH OIL PO) Take 1-2 each by mouth daily as needed.     omeprazole (PRILOSEC) 20 MG capsule TAKE 1 CAPSULE BY MOUTH EVERY DAY 30 capsule 11   promethazine-dextromethorphan (PROMETHAZINE-DM) 6.25-15 MG/5ML syrup Take 5 mLs by mouth 4 (four) times daily as needed for cough. 240 mL 0   propranolol (INDERAL) 10 MG tablet Take 1 tablet (10 mg total) by mouth 2 (two) times daily. 60 tablet 5   Psyllium (METAMUCIL PO) Take by mouth daily. 1 tablespoon     azithromycin (ZITHROMAX Z-PAK) 250 MG tablet As directed 6 tablet 0   No  facility-administered medications prior to visit.    ROS: Review of Systems  Constitutional:  Positive for fatigue. Negative for activity change, appetite change, chills and unexpected weight change.  HENT:  Negative for congestion, mouth sores and sinus pressure.   Eyes:  Negative for visual disturbance.  Respiratory:  Negative for cough, chest tightness and shortness of breath.   Gastrointestinal:  Negative for abdominal pain and nausea.  Genitourinary:  Negative for difficulty urinating, frequency and vaginal pain.  Musculoskeletal:  Positive for arthralgias. Negative for back pain and gait problem.  Skin:  Negative for pallor and rash.  Neurological:  Negative for dizziness, tremors, weakness, numbness and headaches.  Psychiatric/Behavioral:  Negative for confusion, sleep disturbance and suicidal ideas.     Objective:  BP (!) 144/90 (BP Location: Right Arm, Patient Position: Sitting, Cuff Size: Normal)   Pulse 91   Temp 98.6 F (37 C) (Oral)   Ht 5\' 4"  (1.626 m)   Wt 168 lb (76.2 kg)   SpO2 97%   BMI 28.84 kg/m   BP Readings from Last 3 Encounters:  11/03/22 (!) 144/90  10/30/22 (!) 154/88  09/01/22 (!) 130/90    Wt Readings from Last 3 Encounters:  11/03/22  168 lb (76.2 kg)  10/30/22 168 lb (76.2 kg)  09/01/22 163 lb (73.9 kg)    Physical Exam Constitutional:      General: She is not in acute distress.    Appearance: She is well-developed. She is obese.  HENT:     Head: Normocephalic.     Right Ear: External ear normal.     Left Ear: External ear normal.     Nose: Nose normal.  Eyes:     General:        Right eye: No discharge.        Left eye: No discharge.     Conjunctiva/sclera: Conjunctivae normal.     Pupils: Pupils are equal, round, and reactive to light.  Neck:     Thyroid: No thyromegaly.     Vascular: No JVD.     Trachea: No tracheal deviation.  Cardiovascular:     Rate and Rhythm: Normal rate and regular rhythm.     Heart sounds: Normal  heart sounds.  Pulmonary:     Effort: No respiratory distress.     Breath sounds: No stridor. No wheezing.  Abdominal:     General: Bowel sounds are normal. There is no distension.     Palpations: Abdomen is soft. There is no mass.     Tenderness: There is no abdominal tenderness. There is no guarding or rebound.  Musculoskeletal:        General: No tenderness.     Cervical back: Normal range of motion and neck supple. No rigidity.  Lymphadenopathy:     Cervical: No cervical adenopathy.  Skin:    Findings: No erythema or rash.  Neurological:     Mental Status: She is oriented to person, place, and time.     Cranial Nerves: No cranial nerve deficit.     Motor: No abnormal muscle tone.     Coordination: Coordination normal.     Deep Tendon Reflexes: Reflexes normal.  Psychiatric:        Behavior: Behavior normal.        Thought Content: Thought content normal.        Judgment: Judgment normal.   L hand, R eye are bruised L shoulder is in a sling  Lab Results  Component Value Date   WBC 4.3 08/07/2022   HGB 14.4 08/07/2022   HCT 43.2 08/07/2022   PLT 228.0 08/07/2022   GLUCOSE 93 08/07/2022   CHOL 239 (H) 08/07/2022   TRIG 66.0 08/07/2022   HDL 97.20 08/07/2022   LDLDIRECT 172.9 04/03/2013   LDLCALC 129 (H) 08/07/2022   ALT 19 08/07/2022   AST 25 08/07/2022   NA 138 08/07/2022   K 4.1 08/07/2022   CL 101 08/07/2022   CREATININE 0.73 08/07/2022   BUN 14 08/07/2022   CO2 29 08/07/2022   TSH 2.39 08/07/2022   HGBA1C 5.9 08/07/2022   MICROALBUR <0.7 05/21/2017    DG Shoulder Left  Result Date: 10/30/2022 CLINICAL DATA:  Fall on left side. EXAM: LEFT SHOULDER - 2+ VIEW COMPARISON:  Left shoulder pain. FINDINGS: There is diffuse decreased bone mineralization. There is a transverse comminuted fracture of the surgical neck of the humerus with minimal approximate 4 mm anterior displacement of the distal fracture component with respect to the proximal fracture component.  There is an adjacent comminuted fracture of the greater tuberosity with approximately 6 mm lateral displacement of the peripheral greater tuberosity. Normal glenohumeral and acromioclavicular alignment. Mild moderate calcification within the aortic arch. IMPRESSION: 1.  Acute transverse comminuted fracture of the surgical neck of the humerus with minimal anterior displacement of the distal fracture component with respect to the proximal fracture component. 2. Acute comminuted fracture of the greater tuberosity with approximately 6 mm lateral displacement of the peripheral greater tuberosity. Electronically Signed   By: Yvonne Kendall M.D.   On: 10/30/2022 14:03   CT Head Wo Contrast  Result Date: 10/30/2022 CLINICAL DATA:  [place] and hit head. EXAM: CT HEAD WITHOUT CONTRAST TECHNIQUE: Contiguous axial images were obtained from the base of the skull through the vertex without intravenous contrast. RADIATION DOSE REDUCTION: This exam was performed according to the departmental dose-optimization program which includes automated exposure control, adjustment of the mA and/or kV according to patient size and/or use of iterative reconstruction technique. COMPARISON:  No comparison studies available. FINDINGS: Brain: There is no evidence for acute hemorrhage, hydrocephalus, mass lesion, or abnormal extra-axial fluid collection. No definite CT evidence for acute infarction. Diffuse loss of parenchymal volume is consistent with atrophy. Patchy low attenuation in the deep hemispheric and periventricular white matter is nonspecific, but likely reflects chronic microvascular ischemic demyelination. Vascular: No hyperdense vessel or unexpected calcification. Skull: No evidence for fracture. No worrisome lytic or sclerotic lesion. Sinuses/Orbits: Air-fluid level noted left sphenoid sinus. Remaining visualized paranasal sinuses are clear. No mastoid effusion. Visualized portions of the globes and intraorbital fat are unremarkable.  Other: None IMPRESSION: 1. No acute intracranial abnormality. 2. Atrophy with chronic small vessel ischemic disease. 3. Air-fluid level in the left sphenoid sinus suggests acute sinusitis. Electronically Signed   By: Misty Stanley M.D.   On: 10/30/2022 13:54    Assessment & Plan:   Problem List Items Addressed This Visit       Respiratory   Sphenoid sinusitis - Primary    Incidental finding on CT Will treat w/po abx      Relevant Medications   doxycycline (VIBRA-TABS) 100 MG tablet     Musculoskeletal and Integument   Humerus fracture    L shoulder X ray IMPRESSION: 1. Acute transverse comminuted fracture of the surgical neck of the humerus with minimal anterior displacement of the distal fracture component with respect to the proximal fracture component. 2. Acute comminuted fracture of the greater tuberosity with approximately 6 mm lateral displacement of the peripheral greater tuberosity.     Electronically Signed   By: Yvonne Kendall M.D.   On: 10/30/2022 14:03  Norco prn [facility] ref      Relevant Orders   Ambulatory referral to Orthopedic Surgery     Other   Fall against object     10/30/22 S/p a recent fall at [place] office - tripped on the carpet on 3/15. L shoulder w/a fx. Pt went to ER. Pt went to [facility], but left after a long wait. Norco prn Ref to see Dr Onnie Graham         Meds ordered this encounter  Medications   doxycycline (VIBRA-TABS) 100 MG tablet    Sig: Take 1 tablet (100 mg total) by mouth 2 (two) times daily.    Dispense:  60 tablet    Refill:  0   HYDROcodone-acetaminophen (NORCO) 5-325 MG tablet    Sig: Take 1 tablet by mouth every 6 (six) hours as needed for moderate pain.    Dispense:  20 tablet    Refill:  0      Follow-up: Return in about 6 weeks (around 12/15/2022) for a follow-up visit.  Walker Kehr, MD

## 2022-11-03 NOTE — Assessment & Plan Note (Signed)
L shoulder X ray IMPRESSION: 1. Acute transverse comminuted fracture of the surgical neck of the humerus with minimal anterior displacement of the distal fracture component with respect to the proximal fracture component. 2. Acute comminuted fracture of the greater tuberosity with approximately 6 mm lateral displacement of the peripheral greater tuberosity.     Electronically Signed   By: Yvonne Kendall M.D.   On: 10/30/2022 14:03  Norco prn [facility] ref

## 2022-11-04 ENCOUNTER — Telehealth: Payer: Self-pay

## 2022-11-04 NOTE — Telephone Encounter (Signed)
     Patient  visit on 3/15  at [place]   Have you been able to follow up with your primary care physician? Yes   The patient was or was not able to obtain any needed medicine or equipment. Yes   Are there diet recommendations that you are having difficulty following? Na   Patient expresses understanding of discharge instructions and education provided has no other needs at this time.  Yes      [facility] [id number], [place], [place] [address] Phone: [phone] Email: [email or web address]

## 2022-12-03 ENCOUNTER — Encounter: Payer: Self-pay | Admitting: Internal Medicine

## 2022-12-03 ENCOUNTER — Ambulatory Visit (INDEPENDENT_AMBULATORY_CARE_PROVIDER_SITE_OTHER): Payer: Medicare Other | Admitting: Internal Medicine

## 2022-12-03 VITALS — BP 142/88 | HR 80 | Temp 97.9°F | Ht 64.0 in | Wt 170.8 lb

## 2022-12-03 DIAGNOSIS — J323 Chronic sphenoidal sinusitis: Secondary | ICD-10-CM | POA: Diagnosis not present

## 2022-12-03 DIAGNOSIS — E785 Hyperlipidemia, unspecified: Secondary | ICD-10-CM

## 2022-12-03 DIAGNOSIS — R42 Dizziness and giddiness: Secondary | ICD-10-CM | POA: Diagnosis not present

## 2022-12-03 DIAGNOSIS — S42212D Unspecified displaced fracture of surgical neck of left humerus, subsequent encounter for fracture with routine healing: Secondary | ICD-10-CM | POA: Diagnosis not present

## 2022-12-03 DIAGNOSIS — E538 Deficiency of other specified B group vitamins: Secondary | ICD-10-CM

## 2022-12-03 NOTE — Progress Notes (Signed)
Subjective:  Patient ID: Laura Vasquez, female    DOB: [birth year]  Age: 77 y.o. MRN: [id number]  CC: Medical Management of Chronic Issues (13mo follow up/)   HPI [facility] presents for sphenoid sinusitis, L humerus fx - seeing Dr Rennis Chris, GERD  Outpatient Medications Prior to Visit  Medication Sig Dispense Refill   aspirin EC 81 MG tablet Take 81 mg by mouth daily.     B Complex Vitamins (B COMPLEX PO) Take 1 tablet by mouth daily as needed.      calcium carbonate (TUMS - DOSED IN MG ELEMENTAL CALCIUM) 500 MG chewable tablet Chew 1 tablet by mouth as needed.      cholecalciferol (VITAMIN D) 1000 UNITS tablet Take 2,000 Units by mouth daily.     cyanocobalamin 500 MCG tablet Take 500 mcg by mouth daily as needed (Alternate b/t vitamin b complex).      diphenhydrAMINE (BENADRYL ALLERGY CHILDRENS) 12.5 MG chewable tablet Chew 1 tablet (12.5 mg total) by mouth at bedtime as needed (cramps). 30 tablet 0   doxycycline (VIBRA-TABS) 100 MG tablet Take 1 tablet (100 mg total) by mouth 2 (two) times daily. 60 tablet 0   HYDROcodone-acetaminophen (NORCO) 5-325 MG tablet Take 1 tablet by mouth every 6 (six) hours as needed for moderate pain. 20 tablet 0   MAGNESIUM PO Take 800 mg by mouth.     meclizine (ANTIVERT) 25 MG tablet Take 1 tablet (25 mg total) by mouth 3 (three) times daily as needed for dizziness. 30 tablet 1   Omega-3 Fatty Acids (FISH OIL PO) Take 1-2 each by mouth daily as needed.     omeprazole (PRILOSEC) 20 MG capsule TAKE 1 CAPSULE BY MOUTH EVERY DAY 30 capsule 11   propranolol (INDERAL) 10 MG tablet Take 1 tablet (10 mg total) by mouth 2 (two) times daily. 60 tablet 5   Psyllium (METAMUCIL PO) Take by mouth daily. 1 tablespoon     glycopyrrolate (ROBINUL) 2 MG tablet Take 1 tablet (2 mg total) by mouth 3 (three) times daily as needed. 90 tablet 3   promethazine-dextromethorphan (PROMETHAZINE-DM) 6.25-15 MG/5ML syrup Take 5 mLs by mouth 4 (four) times daily as needed for cough.  240 mL 0   cyclobenzaprine (FLEXERIL) 10 MG tablet Take 10 mg by mouth every 6 (six) hours as needed. (Patient not taking: Reported on 12/03/2022)     No facility-administered medications prior to visit.    ROS: Review of Systems  Constitutional:  Negative for activity change, appetite change, chills, fatigue and unexpected weight change.  HENT:  Negative for congestion, mouth sores and sinus pressure.   Eyes:  Negative for visual disturbance.  Respiratory:  Negative for cough and chest tightness.   Gastrointestinal:  Negative for abdominal pain and nausea.  Genitourinary:  Negative for difficulty urinating, frequency and vaginal pain.  Musculoskeletal:  Negative for back pain and gait problem.  Skin:  Negative for pallor and rash.  Neurological:  Negative for dizziness, tremors, weakness, numbness and headaches.  Psychiatric/Behavioral:  Positive for dysphoric mood. Negative for confusion, sleep disturbance and suicidal ideas.     Objective:  BP (!) 142/88   Pulse 83   Temp 97.9 F (36.6 C) (Oral)   Ht  (1.626 m)   Wt 170 lb 12.8 oz (77.5 kg)   SpO2 96%   BMI 29.32 kg/m   BP Readings from Last 3 Encounters:  12/03/22 (!) 142/88  11/03/22 (!) 144/90  10/30/22 (!) 154/88  Wt Readings from Last 3 Encounters:  12/03/22 170 lb 12.8 oz (77.5 kg)  11/03/22 168 lb (76.2 kg)  10/30/22 168 lb (76.2 kg)    Physical Exam Constitutional:      General: She is not in acute distress.    Appearance: She is well-developed. She is obese.  HENT:     Head: Normocephalic.     Right Ear: External ear normal.     Left Ear: External ear normal.     Nose: Nose normal.  Eyes:     General:        Right eye: No discharge.        Left eye: No discharge.     Conjunctiva/sclera: Conjunctivae normal.     Pupils: Pupils are equal, round, and reactive to light.  Neck:     Thyroid: No thyromegaly.     Vascular: No JVD.     Trachea: No tracheal deviation.  Cardiovascular:     Rate  and Rhythm: Normal rate and regular rhythm.     Heart sounds: Normal heart sounds.  Pulmonary:     Effort: No respiratory distress.     Breath sounds: No stridor. No wheezing.  Abdominal:     General: Bowel sounds are normal. There is no distension.     Palpations: Abdomen is soft. There is no mass.     Tenderness: There is no abdominal tenderness. There is no guarding or rebound.  Musculoskeletal:        General: No tenderness.     Cervical back: Normal range of motion and neck supple. No rigidity.  Lymphadenopathy:     Cervical: No cervical adenopathy.  Skin:    Findings: No erythema or rash.  Neurological:     Cranial Nerves: No cranial nerve deficit.     Motor: No abnormal muscle tone.     Coordination: Coordination normal.     Deep Tendon Reflexes: Reflexes normal.  Psychiatric:        Behavior: Behavior normal.        Thought Content: Thought content normal.        Judgment: Judgment normal.   Tearful L shoulder is in a sling  Lab Results  Component Value Date   WBC 4.3 08/07/2022   HGB 14.4 08/07/2022   HCT 43.2 08/07/2022   PLT 228.0 08/07/2022   GLUCOSE 93 08/07/2022   CHOL 239 (H) 08/07/2022   TRIG 66.0 08/07/2022   HDL 97.20 08/07/2022   LDLDIRECT 172.9 04/03/2013   LDLCALC 129 (H) 08/07/2022   ALT 19 08/07/2022   AST 25 08/07/2022   NA 138 08/07/2022   K 4.1 08/07/2022   CL 101 08/07/2022   CREATININE 0.73 08/07/2022   BUN 14 08/07/2022   CO2 29 08/07/2022   TSH 2.39 08/07/2022   HGBA1C 5.9 08/07/2022   MICROALBUR <0.7 05/21/2017    DG Shoulder Left  Result Date: 10/30/2022 CLINICAL DATA:  Fall on left side. EXAM: LEFT SHOULDER - 2+ VIEW COMPARISON:  Left shoulder pain. FINDINGS: There is diffuse decreased bone mineralization. There is a transverse comminuted fracture of the surgical neck of the humerus with minimal approximate 4 mm anterior displacement of the distal fracture component with respect to the proximal fracture component. There is an  adjacent comminuted fracture of the greater tuberosity with approximately 6 mm lateral displacement of the peripheral greater tuberosity. Normal glenohumeral and acromioclavicular alignment. Mild moderate calcification within the aortic arch. IMPRESSION: 1. Acute transverse comminuted fracture of the surgical neck of  the humerus with minimal anterior displacement of the distal fracture component with respect to the proximal fracture component. 2. Acute comminuted fracture of the greater tuberosity with approximately 6 mm lateral displacement of the peripheral greater tuberosity. Electronically Signed   By: Neita Garnet M.D.   On: 10/30/2022 14:03   CT Head Wo Contrast  Result Date: 10/30/2022 CLINICAL DATA:  [place] and hit head. EXAM: CT HEAD WITHOUT CONTRAST TECHNIQUE: Contiguous axial images were obtained from the base of the skull through the vertex without intravenous contrast. RADIATION DOSE REDUCTION: This exam was performed according to the departmental dose-optimization program which includes automated exposure control, adjustment of the mA and/or kV according to patient size and/or use of iterative reconstruction technique. COMPARISON:  No comparison studies available. FINDINGS: Brain: There is no evidence for acute hemorrhage, hydrocephalus, mass lesion, or abnormal extra-axial fluid collection. No definite CT evidence for acute infarction. Diffuse loss of parenchymal volume is consistent with atrophy. Patchy low attenuation in the deep hemispheric and periventricular white matter is nonspecific, but likely reflects chronic microvascular ischemic demyelination. Vascular: No hyperdense vessel or unexpected calcification. Skull: No evidence for fracture. No worrisome lytic or sclerotic lesion. Sinuses/Orbits: Air-fluid level noted left sphenoid sinus. Remaining visualized paranasal sinuses are clear. No mastoid effusion. Visualized portions of the globes and intraorbital fat are unremarkable. Other: None  IMPRESSION: 1. No acute intracranial abnormality. 2. Atrophy with chronic small vessel ischemic disease. 3. Air-fluid level in the left sphenoid sinus suggests acute sinusitis. Electronically Signed   By: Kennith Center M.D.   On: 10/30/2022 13:54    Assessment & Plan:   Problem List Items Addressed This Visit       Respiratory   Sphenoid sinusitis    Finishing Rx with Doxy        Musculoskeletal and Integument   Humerus fracture - Primary    L humerus fx - seeing Dr Rennis Chris. Options are being discussed. Pt is starting PT        Other   B12 deficiency    On B12      Vertigo    No change         No orders of the defined types were placed in this encounter.     Follow-up: Return in about 3 months (around 03/04/2023) for a follow-up visit.  Sonda Primes, MD

## 2022-12-03 NOTE — Assessment & Plan Note (Signed)
Finishing Rx with Doxy

## 2022-12-03 NOTE — Assessment & Plan Note (Signed)
On B12 

## 2022-12-03 NOTE — Assessment & Plan Note (Signed)
L humerus fx - seeing Dr Rennis Chris. Options are being discussed. Pt is starting PT

## 2022-12-03 NOTE — Assessment & Plan Note (Signed)
No change 

## 2022-12-14 ENCOUNTER — Other Ambulatory Visit (INDEPENDENT_AMBULATORY_CARE_PROVIDER_SITE_OTHER): Payer: Medicare Other

## 2022-12-14 DIAGNOSIS — J323 Chronic sphenoidal sinusitis: Secondary | ICD-10-CM | POA: Diagnosis not present

## 2022-12-14 DIAGNOSIS — R42 Dizziness and giddiness: Secondary | ICD-10-CM

## 2022-12-14 DIAGNOSIS — E538 Deficiency of other specified B group vitamins: Secondary | ICD-10-CM

## 2022-12-14 DIAGNOSIS — E785 Hyperlipidemia, unspecified: Secondary | ICD-10-CM

## 2022-12-14 LAB — LIPID PANEL
Cholesterol: 243 mg/dL — ABNORMAL HIGH (ref 0–200)
HDL: 90.3 mg/dL (ref >39.00)
LDL Cholesterol: 134 mg/dL — ABNORMAL HIGH (ref 0–99)
NonHDL: 152.9
Total CHOL/HDL Ratio: 3
Triglycerides: 93 mg/dL (ref 0.0–149.0)
VLDL: 18.6 mg/dL (ref 0.0–40.0)

## 2022-12-14 LAB — COMPREHENSIVE METABOLIC PANEL
ALT: 21 U/L (ref 0–35)
AST: 25 U/L (ref 0–37)
Albumin: 4.2 g/dL (ref 3.5–5.2)
Alkaline Phosphatase: 42 U/L (ref 39–117)
BUN: 12 mg/dL (ref 6–23)
CO2: 29 mEq/L (ref 19–32)
Calcium: 9.9 mg/dL (ref 8.4–10.5)
Chloride: 102 mEq/L (ref 96–112)
Creatinine, Ser: 0.73 mg/dL (ref 0.40–1.20)
GFR: 78.52 mL/min (ref >60.00)
Glucose, Bld: 90 mg/dL (ref 70–99)
Potassium: 3.9 mEq/L (ref 3.5–5.1)
Sodium: 140 mEq/L (ref 135–145)
Total Bilirubin: 0.8 mg/dL (ref 0.2–1.2)
Total Protein: 7 g/dL (ref 6.0–8.3)

## 2022-12-14 LAB — CBC WITH DIFFERENTIAL/PLATELET
Basophils Absolute: 0 10*3/uL (ref 0.0–0.1)
Basophils Relative: 0.3 % (ref 0.0–3.0)
Eosinophils Absolute: 0.1 10*3/uL (ref 0.0–0.7)
Eosinophils Relative: 2.2 % (ref 0.0–5.0)
HCT: 41.7 % (ref 36.0–46.0)
Hemoglobin: 14 g/dL (ref 12.0–15.0)
Lymphocytes Relative: 34.9 % (ref 12.0–46.0)
Lymphs Abs: 1.4 10*3/uL (ref 0.7–4.0)
MCHC: 33.5 g/dL (ref 30.0–36.0)
MCV: 92.5 fl (ref 78.0–100.0)
Monocytes Absolute: 0.3 10*3/uL (ref 0.1–1.0)
Monocytes Relative: 7.9 % (ref 3.0–12.0)
Neutro Abs: 2.1 10*3/uL (ref 1.4–7.7)
Neutrophils Relative %: 54.7 % (ref 43.0–77.0)
Platelets: 240 10*3/uL (ref 150.0–400.0)
RBC: 4.51 Mil/uL (ref 3.87–5.11)
RDW: 14 % (ref 11.5–15.5)
WBC: 3.9 10*3/uL — ABNORMAL LOW (ref 4.0–10.5)

## 2022-12-14 LAB — URINALYSIS
Bilirubin Urine: NEGATIVE
Hgb urine dipstick: NEGATIVE
Ketones, ur: NEGATIVE
Leukocytes,Ua: NEGATIVE
Nitrite: NEGATIVE
Specific Gravity, Urine: 1.015 (ref 1.000–1.030)
Total Protein, Urine: NEGATIVE
Urine Glucose: NEGATIVE
Urobilinogen, UA: 0.2 (ref 0.0–1.0)
pH: 7.5 (ref 5.0–8.0)

## 2022-12-14 LAB — TSH: TSH: 2.51 u[IU]/mL (ref 0.35–5.50)

## 2022-12-14 LAB — VITAMIN B12: Vitamin B-12: 469 pg/mL (ref 211–911)

## 2022-12-16 ENCOUNTER — Encounter: Payer: Self-pay | Admitting: Internal Medicine

## 2022-12-16 ENCOUNTER — Ambulatory Visit (INDEPENDENT_AMBULATORY_CARE_PROVIDER_SITE_OTHER): Payer: Medicare Other | Admitting: Internal Medicine

## 2022-12-16 VITALS — BP 122/80 | Temp 98.6°F | Ht 64.0 in | Wt 169.0 lb

## 2022-12-16 DIAGNOSIS — S42212D Unspecified displaced fracture of surgical neck of left humerus, subsequent encounter for fracture with routine healing: Secondary | ICD-10-CM | POA: Diagnosis not present

## 2022-12-16 DIAGNOSIS — I10 Essential (primary) hypertension: Secondary | ICD-10-CM

## 2022-12-16 DIAGNOSIS — W1800XA Striking against unspecified object with subsequent fall, initial encounter: Secondary | ICD-10-CM

## 2022-12-16 DIAGNOSIS — J0100 Acute maxillary sinusitis, unspecified: Secondary | ICD-10-CM

## 2022-12-16 DIAGNOSIS — J323 Chronic sphenoidal sinusitis: Secondary | ICD-10-CM

## 2022-12-16 DIAGNOSIS — E538 Deficiency of other specified B group vitamins: Secondary | ICD-10-CM

## 2022-12-16 DIAGNOSIS — E785 Hyperlipidemia, unspecified: Secondary | ICD-10-CM

## 2022-12-16 NOTE — Assessment & Plan Note (Signed)
Chronic °Cont on Vit B12 °

## 2022-12-16 NOTE — Assessment & Plan Note (Signed)
Monitoring labs Pt declined statins

## 2022-12-16 NOTE — Assessment & Plan Note (Signed)
Seeing Dr Rennis Chris, in PT x 4 weeks

## 2022-12-16 NOTE — Assessment & Plan Note (Signed)
Laura Vasquez finished abx - Doxy

## 2022-12-16 NOTE — Assessment & Plan Note (Signed)
BP Readings from Last 3 Encounters:  12/16/22 122/80  12/03/22 (!) 142/88  11/03/22 (!) 144/90

## 2022-12-16 NOTE — Assessment & Plan Note (Signed)
L shoulder fx. Seeing Dr Rennis Chris, in PT x 4 weeks

## 2022-12-16 NOTE — Addendum Note (Signed)
Addended by: Tresa Garter on: 12/16/2022 08:41 AM   Modules accepted: Level of Service

## 2022-12-16 NOTE — Progress Notes (Signed)
Subjective:  Patient ID: [name], female    DOB: May 15, 1946  Age: 77 y.o. MRN: [id number]  CC: Follow-up (6 WEEK F/U)   HPI [facility] presents for L shoulder fx Seeing Dr Rennis Chris, in PT x 4 weeks F/u HTN F/u sinusitis - finished abx  Outpatient Medications Prior to Visit  Medication Sig Dispense Refill   aspirin EC 81 MG tablet Take 81 mg by mouth daily.     B Complex Vitamins (B COMPLEX PO) Take 1 tablet by mouth daily as needed.      calcium carbonate (TUMS - DOSED IN MG ELEMENTAL CALCIUM) 500 MG chewable tablet Chew 1 tablet by mouth as needed.      cholecalciferol (VITAMIN D) 1000 UNITS tablet Take 2,000 Units by mouth daily.     cyanocobalamin 500 MCG tablet Take 500 mcg by mouth daily as needed (Alternate b/t vitamin b complex).      cyclobenzaprine (FLEXERIL) 10 MG tablet Take 10 mg by mouth every 6 (six) hours as needed.     diphenhydrAMINE (BENADRYL ALLERGY CHILDRENS) 12.5 MG chewable tablet Chew 1 tablet (12.5 mg total) by mouth at bedtime as needed (cramps). 30 tablet 0   HYDROcodone-acetaminophen (NORCO) 5-325 MG tablet Take 1 tablet by mouth every 6 (six) hours as needed for moderate pain. 20 tablet 0   MAGNESIUM PO Take 800 mg by mouth.     meclizine (ANTIVERT) 25 MG tablet Take 1 tablet (25 mg total) by mouth 3 (three) times daily as needed for dizziness. 30 tablet 1   Omega-3 Fatty Acids (FISH OIL PO) Take 1-2 each by mouth daily as needed.     omeprazole (PRILOSEC) 20 MG capsule TAKE 1 CAPSULE BY MOUTH EVERY DAY 30 capsule 11   propranolol (INDERAL) 10 MG tablet Take 1 tablet (10 mg total) by mouth 2 (two) times daily. 60 tablet 5   Psyllium (METAMUCIL PO) Take by mouth daily. 1 tablespoon     doxycycline (VIBRA-TABS) 100 MG tablet Take 1 tablet (100 mg total) by mouth 2 (two) times daily. 60 tablet 0   No facility-administered medications prior to visit.    ROS: Review of Systems  Constitutional:  Positive for fatigue. Negative for activity change,  appetite change, chills and unexpected weight change.  HENT:  Negative for congestion, mouth sores and sinus pressure.   Eyes:  Negative for visual disturbance.  Respiratory:  Negative for cough and chest tightness.   Gastrointestinal:  Negative for abdominal pain and nausea.  Genitourinary:  Negative for difficulty urinating, frequency and vaginal pain.  Musculoskeletal:  Positive for arthralgias. Negative for back pain and gait problem.  Skin:  Negative for pallor and rash.  Neurological:  Negative for dizziness, tremors, weakness, numbness and headaches.  Psychiatric/Behavioral:  Negative for confusion and sleep disturbance. The patient is nervous/anxious.     Objective:  BP 122/80 (BP Location: Right Arm, Patient Position: Sitting, Cuff Size: Large)   Temp 98.6 F (37 C) (Oral)   Ht 5\' 4"  (1.626 m)   Wt 169 lb (76.7 kg)   BMI 29.01 kg/m   BP Readings from Last 3 Encounters:  12/16/22 122/80  12/03/22 (!) 142/88  11/03/22 (!) 144/90    Wt Readings from Last 3 Encounters:  12/16/22 169 lb (76.7 kg)  12/03/22 170 lb 12.8 oz (77.5 kg)  11/03/22 168 lb (76.2 kg)    Physical Exam Constitutional:      General: She is not in acute distress.  Appearance: Normal appearance. She is well-developed.  HENT:     Head: Normocephalic.     Right Ear: External ear normal.     Left Ear: External ear normal.     Nose: Nose normal.  Eyes:     General:        Right eye: No discharge.        Left eye: No discharge.     Conjunctiva/sclera: Conjunctivae normal.     Pupils: Pupils are equal, round, and reactive to light.  Neck:     Thyroid: No thyromegaly.     Vascular: No JVD.     Trachea: No tracheal deviation.  Cardiovascular:     Rate and Rhythm: Normal rate and regular rhythm.     Heart sounds: Normal heart sounds.  Pulmonary:     Effort: No respiratory distress.     Breath sounds: No stridor. No wheezing.  Abdominal:     General: Bowel sounds are normal. There is no  distension.     Palpations: Abdomen is soft. There is no mass.     Tenderness: There is no abdominal tenderness. There is no guarding or rebound.  Musculoskeletal:     Cervical back: Normal range of motion and neck supple. Tenderness present. No rigidity.  Lymphadenopathy:     Cervical: No cervical adenopathy.  Skin:    Findings: No erythema or rash.  Neurological:     Cranial Nerves: No cranial nerve deficit.     Motor: No abnormal muscle tone.     Coordination: Coordination normal.     Deep Tendon Reflexes: Reflexes normal.  Psychiatric:        Mood and Affect: Mood normal.        Behavior: Behavior normal.        Thought Content: Thought content normal.        Judgment: Judgment normal.   L arm is in a sling  Lab Results  Component Value Date   WBC 3.9 (L) 12/14/2022   HGB 14.0 12/14/2022   HCT 41.7 12/14/2022   PLT 240.0 12/14/2022   GLUCOSE 90 12/14/2022   CHOL 243 (H) 12/14/2022   TRIG 93.0 12/14/2022   HDL 90.30 12/14/2022   LDLDIRECT 172.9 04/03/2013   LDLCALC 134 (H) 12/14/2022   ALT 21 12/14/2022   AST 25 12/14/2022   NA 140 12/14/2022   K 3.9 12/14/2022   CL 102 12/14/2022   CREATININE 0.73 12/14/2022   BUN 12 12/14/2022   CO2 29 12/14/2022   TSH 2.51 12/14/2022   HGBA1C 5.9 08/07/2022   MICROALBUR <0.7 05/21/2017    DG Shoulder Left  Result Date: 10/30/2022 CLINICAL DATA:  Fall on left side. EXAM: LEFT SHOULDER - 2+ VIEW COMPARISON:  Left shoulder pain. FINDINGS: There is diffuse decreased bone mineralization. There is a transverse comminuted fracture of the surgical neck of the humerus with minimal approximate 4 mm anterior displacement of the distal fracture component with respect to the proximal fracture component. There is an adjacent comminuted fracture of the greater tuberosity with approximately 6 mm lateral displacement of the peripheral greater tuberosity. Normal glenohumeral and acromioclavicular alignment. Mild moderate calcification within the  aortic arch. IMPRESSION: 1. Acute transverse comminuted fracture of the surgical neck of the humerus with minimal anterior displacement of the distal fracture component with respect to the proximal fracture component. 2. Acute comminuted fracture of the greater tuberosity with approximately 6 mm lateral displacement of the peripheral greater tuberosity. Electronically Signed   By: Windy Fast  Viola M.D.   On: 10/30/2022 14:03   CT Head Wo Contrast  Result Date: 10/30/2022 CLINICAL DATA:  Larey Seat and hit head. EXAM: CT HEAD WITHOUT CONTRAST TECHNIQUE: Contiguous axial images were obtained from the base of the skull through the vertex without intravenous contrast. RADIATION DOSE REDUCTION: This exam was performed according to the departmental dose-optimization program which includes automated exposure control, adjustment of the mA and/or kV according to patient size and/or use of iterative reconstruction technique. COMPARISON:  No comparison studies available. FINDINGS: Brain: There is no evidence for acute hemorrhage, hydrocephalus, mass lesion, or abnormal extra-axial fluid collection. No definite CT evidence for acute infarction. Diffuse loss of parenchymal volume is consistent with atrophy. Patchy low attenuation in the deep hemispheric and periventricular white matter is nonspecific, but likely reflects chronic microvascular ischemic demyelination. Vascular: No hyperdense vessel or unexpected calcification. Skull: No evidence for fracture. No worrisome lytic or sclerotic lesion. Sinuses/Orbits: Air-fluid level noted left sphenoid sinus. Remaining visualized paranasal sinuses are clear. No mastoid effusion. Visualized portions of the globes and intraorbital fat are unremarkable. Other: None IMPRESSION: 1. No acute intracranial abnormality. 2. Atrophy with chronic small vessel ischemic disease. 3. Air-fluid level in the left sphenoid sinus suggests acute sinusitis. Electronically Signed   By: Kennith Center M.D.   On:  10/30/2022 13:54    Assessment & Plan:   Problem List Items Addressed This Visit     B12 deficiency - Primary    Chronic  Cont on Vit B12      Essential hypertension    BP Readings from Last 3 Encounters:  12/16/22 122/80  12/03/22 (!) 142/88  11/03/22 (!) 144/90        Acute sinus infection    Adeena finished abx - Doxy      Hyperlipidemia    Monitoring labs Pt declined statins      Sphenoid sinusitis    Isobella finished abx      Humerus fracture    Seeing Dr Rennis Chris, in PT x 4 weeks      Fall against object    L shoulder fx. Seeing Dr Rennis Chris, in PT x 4 weeks         No orders of the defined types were placed in this encounter.     Follow-up: Return in about 6 weeks (around 01/27/2023) for a follow-up visit.  Sonda Primes, MD

## 2022-12-16 NOTE — Assessment & Plan Note (Signed)
Laura Vasquez finished abx

## 2023-01-28 ENCOUNTER — Ambulatory Visit: Payer: Medicare Other | Admitting: Internal Medicine

## 2023-02-03 ENCOUNTER — Ambulatory Visit (INDEPENDENT_AMBULATORY_CARE_PROVIDER_SITE_OTHER): Payer: Medicare Other | Admitting: Internal Medicine

## 2023-02-03 ENCOUNTER — Encounter: Payer: Self-pay | Admitting: Internal Medicine

## 2023-02-03 VITALS — BP 130/84 | HR 68 | Temp 97.8°F | Ht 64.0 in | Wt 170.0 lb

## 2023-02-03 DIAGNOSIS — E785 Hyperlipidemia, unspecified: Secondary | ICD-10-CM

## 2023-02-03 DIAGNOSIS — E538 Deficiency of other specified B group vitamins: Secondary | ICD-10-CM

## 2023-02-03 DIAGNOSIS — F439 Reaction to severe stress, unspecified: Secondary | ICD-10-CM | POA: Diagnosis not present

## 2023-02-03 DIAGNOSIS — S42212D Unspecified displaced fracture of surgical neck of left humerus, subsequent encounter for fracture with routine healing: Secondary | ICD-10-CM | POA: Diagnosis not present

## 2023-02-03 DIAGNOSIS — D751 Secondary polycythemia: Secondary | ICD-10-CM

## 2023-02-03 DIAGNOSIS — I10 Essential (primary) hypertension: Secondary | ICD-10-CM

## 2023-02-03 NOTE — Assessment & Plan Note (Signed)
Discussed   L shoulder fx - in PT; seeing Dr Rennis Chris

## 2023-02-03 NOTE — Assessment & Plan Note (Signed)
Chronic  Dr Elease Hashimoto

## 2023-02-03 NOTE — Assessment & Plan Note (Signed)
Monitoring labs Pt declined statins 

## 2023-02-03 NOTE — Assessment & Plan Note (Signed)
On Propranolol Cardiac cor calcium CT offered - declined 

## 2023-02-03 NOTE — Progress Notes (Signed)
Subjective:  Patient ID: Laura Vasquez, female    DOB: Oct 20, 1945  Age: 77 y.o. MRN: [id number]  CC: Follow-up (6 week f/u)   HPI Laura Vasquez presents for L shoulder fx - in PT; seeing Dr Rennis Chris F/u HTN, stress  Outpatient Medications Prior to Visit  Medication Sig Dispense Refill   aspirin EC 81 MG tablet Take 81 mg by mouth daily.     B Complex Vitamins (B COMPLEX PO) Take 1 tablet by mouth daily as needed.      calcium carbonate (TUMS - DOSED IN MG ELEMENTAL CALCIUM) 500 MG chewable tablet Chew 1 tablet by mouth as needed.      cholecalciferol (VITAMIN D) 1000 UNITS tablet Take 2,000 Units by mouth daily.     cyanocobalamin 500 MCG tablet Take 500 mcg by mouth daily as needed (Alternate b/t vitamin b complex).      cyclobenzaprine (FLEXERIL) 10 MG tablet Take 10 mg by mouth every 6 (six) hours as needed.     diphenhydrAMINE (BENADRYL ALLERGY CHILDRENS) 12.5 MG chewable tablet Chew 1 tablet (12.5 mg total) by mouth at bedtime as needed (cramps). 30 tablet 0   HYDROcodone-acetaminophen (NORCO) 5-325 MG tablet Take 1 tablet by mouth every 6 (six) hours as needed for moderate pain. 20 tablet 0   MAGNESIUM PO Take 800 mg by mouth.     meclizine (ANTIVERT) 25 MG tablet Take 1 tablet (25 mg total) by mouth 3 (three) times daily as needed for dizziness. 30 tablet 1   Omega-3 Fatty Acids (FISH OIL PO) Take 1-2 each by mouth daily as needed.     omeprazole (PRILOSEC) 20 MG capsule TAKE 1 CAPSULE BY MOUTH EVERY DAY 30 capsule 11   propranolol (INDERAL) 10 MG tablet Take 1 tablet (10 mg total) by mouth 2 (two) times daily. 60 tablet 5   Psyllium (METAMUCIL PO) Take by mouth daily. 1 tablespoon     No facility-administered medications prior to visit.    ROS: Review of Systems  Constitutional:  Negative for activity change, appetite change, chills, fatigue and unexpected weight change.  HENT:  Negative for congestion, mouth sores and sinus pressure.   Eyes:  Negative for visual  disturbance.  Respiratory:  Negative for cough and chest tightness.   Gastrointestinal:  Negative for abdominal pain and nausea.  Genitourinary:  Negative for difficulty urinating, frequency and vaginal pain.  Musculoskeletal:  Positive for arthralgias. Negative for back pain and gait problem.  Skin:  Negative for pallor and rash.  Neurological:  Negative for dizziness, tremors, weakness, numbness and headaches.  Psychiatric/Behavioral:  Negative for confusion, sleep disturbance and suicidal ideas. The patient is nervous/anxious.     Objective:  BP 130/84 (BP Location: Left Arm, Patient Position: Sitting, Cuff Size: Large)   Pulse 68   Temp 97.8 F (36.6 C) (Oral)   Ht 5\' 4"  (1.626 m)   Wt 170 lb (77.1 kg)   SpO2 97%   BMI 29.18 kg/m   BP Readings from Last 3 Encounters:  02/03/23 130/84  12/16/22 122/80  12/03/22 (!) 142/88    Wt Readings from Last 3 Encounters:  02/03/23 170 lb (77.1 kg)  12/16/22 169 lb (76.7 kg)  12/03/22 170 lb 12.8 oz (77.5 kg)    Physical Exam Constitutional:      General: She is not in acute distress.    Appearance: She is well-developed. She is obese.  HENT:     Head: Normocephalic.     Right Ear:  External ear normal.     Left Ear: External ear normal.     Nose: Nose normal.  Eyes:     General:        Right eye: No discharge.        Left eye: No discharge.     Conjunctiva/sclera: Conjunctivae normal.     Pupils: Pupils are equal, round, and reactive to light.  Neck:     Thyroid: No thyromegaly.     Vascular: No JVD.     Trachea: No tracheal deviation.  Cardiovascular:     Rate and Rhythm: Normal rate and regular rhythm.     Heart sounds: Normal heart sounds.  Pulmonary:     Effort: No respiratory distress.     Breath sounds: No stridor. No wheezing.  Abdominal:     General: Bowel sounds are normal. There is no distension.     Palpations: Abdomen is soft. There is no mass.     Tenderness: There is no abdominal tenderness. There is  no guarding or rebound.  Musculoskeletal:        General: No tenderness.     Cervical back: Normal range of motion and neck supple. No rigidity.     Right lower leg: No edema.  Lymphadenopathy:     Cervical: No cervical adenopathy.  Skin:    Findings: No erythema or rash.  Neurological:     Mental Status: She is oriented to person, place, and time.     Cranial Nerves: No cranial nerve deficit.     Motor: No abnormal muscle tone.     Coordination: Coordination normal.     Gait: Gait normal.     Deep Tendon Reflexes: Reflexes normal.  Psychiatric:        Behavior: Behavior normal.        Thought Content: Thought content normal.        Judgment: Judgment normal.   L shoulder w/pain and restricted ROM  Lab Results  Component Value Date   WBC 3.9 (L) 12/14/2022   HGB 14.0 12/14/2022   HCT 41.7 12/14/2022   PLT 240.0 12/14/2022   GLUCOSE 90 12/14/2022   CHOL 243 (H) 12/14/2022   TRIG 93.0 12/14/2022   HDL 90.30 12/14/2022   LDLDIRECT 172.9 04/03/2013   LDLCALC 134 (H) 12/14/2022   ALT 21 12/14/2022   AST 25 12/14/2022   NA 140 12/14/2022   K 3.9 12/14/2022   CL 102 12/14/2022   CREATININE 0.73 12/14/2022   BUN 12 12/14/2022   CO2 29 12/14/2022   TSH 2.51 12/14/2022   HGBA1C 5.9 08/07/2022   MICROALBUR <0.7 05/21/2017    DG Shoulder Left  Result Date: 10/30/2022 CLINICAL DATA:  Fall on left side. EXAM: LEFT SHOULDER - 2+ VIEW COMPARISON:  Left shoulder pain. FINDINGS: There is diffuse decreased bone mineralization. There is a transverse comminuted fracture of the surgical neck of the humerus with minimal approximate 4 mm anterior displacement of the distal fracture component with respect to the proximal fracture component. There is an adjacent comminuted fracture of the greater tuberosity with approximately 6 mm lateral displacement of the peripheral greater tuberosity. Normal glenohumeral and acromioclavicular alignment. Mild moderate calcification within the aortic arch.  IMPRESSION: 1. Acute transverse comminuted fracture of the surgical neck of the humerus with minimal anterior displacement of the distal fracture component with respect to the proximal fracture component. 2. Acute comminuted fracture of the greater tuberosity with approximately 6 mm lateral displacement of the peripheral greater tuberosity. Electronically  Signed   By: Neita Garnet M.D.   On: 10/30/2022 14:03   CT Head Wo Contrast  Result Date: 10/30/2022 CLINICAL DATA:  Larey Seat and hit head. EXAM: CT HEAD WITHOUT CONTRAST TECHNIQUE: Contiguous axial images were obtained from the base of the skull through the vertex without intravenous contrast. RADIATION DOSE REDUCTION: This exam was performed according to the departmental dose-optimization program which includes automated exposure control, adjustment of the mA and/or kV according to patient size and/or use of iterative reconstruction technique. COMPARISON:  No comparison studies available. FINDINGS: Brain: There is no evidence for acute hemorrhage, hydrocephalus, mass lesion, or abnormal extra-axial fluid collection. No definite CT evidence for acute infarction. Diffuse loss of parenchymal volume is consistent with atrophy. Patchy low attenuation in the deep hemispheric and periventricular white matter is nonspecific, but likely reflects chronic microvascular ischemic demyelination. Vascular: No hyperdense vessel or unexpected calcification. Skull: No evidence for fracture. No worrisome lytic or sclerotic lesion. Sinuses/Orbits: Air-fluid level noted left sphenoid sinus. Remaining visualized paranasal sinuses are clear. No mastoid effusion. Visualized portions of the globes and intraorbital fat are unremarkable. Other: None IMPRESSION: 1. No acute intracranial abnormality. 2. Atrophy with chronic small vessel ischemic disease. 3. Air-fluid level in the left sphenoid sinus suggests acute sinusitis. Electronically Signed   By: Kennith Center M.D.   On: 10/30/2022  13:54    Assessment & Plan:   Problem List Items Addressed This Visit     B12 deficiency    On B12      Dyslipidemia    Chronic  Dr Elease Hashimoto      Essential hypertension    On Propranolol Cardiac cor calcium CT offered - declined      Polycythemia    Monitor CBC      Hyperlipidemia    Monitoring labs Pt declined statins      Stress at home - Primary    Discussed   L shoulder fx - in PT; seeing Dr Rennis Chris      Humerus fracture    In PT L humerus fx - seeing Dr Rennis Chris         No orders of the defined types were placed in this encounter.     Follow-up: No follow-ups on file.  Sonda Primes, MD

## 2023-02-03 NOTE — Assessment & Plan Note (Signed)
Monitor CBC 

## 2023-02-03 NOTE — Assessment & Plan Note (Signed)
In PT L humerus fx - seeing Dr Rennis Chris

## 2023-02-03 NOTE — Assessment & Plan Note (Signed)
On B12 

## 2023-05-05 ENCOUNTER — Ambulatory Visit (INDEPENDENT_AMBULATORY_CARE_PROVIDER_SITE_OTHER): Payer: Medicare Other | Admitting: Internal Medicine

## 2023-05-05 ENCOUNTER — Encounter: Payer: Self-pay | Admitting: Internal Medicine

## 2023-05-05 VITALS — BP 120/80 | HR 72 | Temp 98.6°F | Ht 64.0 in | Wt 173.0 lb

## 2023-05-05 DIAGNOSIS — D751 Secondary polycythemia: Secondary | ICD-10-CM

## 2023-05-05 DIAGNOSIS — K219 Gastro-esophageal reflux disease without esophagitis: Secondary | ICD-10-CM | POA: Diagnosis not present

## 2023-05-05 DIAGNOSIS — S4292XS Fracture of left shoulder girdle, part unspecified, sequela: Secondary | ICD-10-CM | POA: Diagnosis not present

## 2023-05-05 DIAGNOSIS — R739 Hyperglycemia, unspecified: Secondary | ICD-10-CM

## 2023-05-05 DIAGNOSIS — I10 Essential (primary) hypertension: Secondary | ICD-10-CM | POA: Diagnosis not present

## 2023-05-05 DIAGNOSIS — E538 Deficiency of other specified B group vitamins: Secondary | ICD-10-CM

## 2023-05-05 DIAGNOSIS — E785 Hyperlipidemia, unspecified: Secondary | ICD-10-CM

## 2023-05-05 DIAGNOSIS — S4292XA Fracture of left shoulder girdle, part unspecified, initial encounter for closed fracture: Secondary | ICD-10-CM | POA: Insufficient documentation

## 2023-05-05 NOTE — Assessment & Plan Note (Signed)
Healing well. Residual pain. Seeing Dr Rennis Chris

## 2023-05-05 NOTE — Progress Notes (Signed)
Subjective:  Patient ID: Laura Vasquez, female    DOB: [birth year]  Age: 77 y.o. MRN: [id number]  CC: Follow-up (3 MN[facility] F/U)   HPI Laura Vasquez presents for HTN,  L shoulder fx  , B12 def  Outpatient Medications Prior to Visit  Medication Sig Dispense Refill   aspirin EC 81 MG tablet Take 81 mg by mouth daily.     B Complex Vitamins (B COMPLEX PO) Take 1 tablet by mouth daily as needed.      calcium carbonate (TUMS - DOSED IN MG ELEMENTAL CALCIUM) 500 MG chewable tablet Chew 1 tablet by mouth as needed.      cholecalciferol (VITAMIN D) 1000 UNITS tablet Take 2,000 Units by mouth daily.     cyanocobalamin 500 MCG tablet Take 500 mcg by mouth daily as needed (Alternate b/t vitamin b complex).      cyclobenzaprine (FLEXERIL) 10 MG tablet Take 10 mg by mouth every 6 (six) hours as needed.     diphenhydrAMINE (BENADRYL ALLERGY CHILDRENS) 12.5 MG chewable tablet Chew 1 tablet (12.5 mg total) by mouth at bedtime as needed (cramps). 30 tablet 0   HYDROcodone-acetaminophen (NORCO) 5-325 MG tablet Take 1 tablet by mouth every 6 (six) hours as needed for moderate pain. 20 tablet 0   MAGNESIUM PO Take 800 mg by mouth.     meclizine (ANTIVERT) 25 MG tablet Take 1 tablet (25 mg total) by mouth 3 (three) times daily as needed for dizziness. 30 tablet 1   Omega-3 Fatty Acids (FISH OIL PO) Take 1-2 each by mouth daily as needed.     omeprazole (PRILOSEC) 20 MG capsule TAKE 1 CAPSULE BY MOUTH EVERY DAY 30 capsule 11   propranolol (INDERAL) 10 MG tablet Take 1 tablet (10 mg total) by mouth 2 (two) times daily. 60 tablet 5   Psyllium (METAMUCIL PO) Take by mouth daily. 1 tablespoon     No facility-administered medications prior to visit.    ROS: Review of Systems  Constitutional:  Negative for activity change, appetite change, chills, fatigue and unexpected weight change.  HENT:  Negative for congestion, mouth sores and sinus pressure.   Eyes:  Negative for visual disturbance.  Respiratory:  Negative  for cough and chest tightness.   Gastrointestinal:  Negative for abdominal pain and nausea.  Genitourinary:  Negative for difficulty urinating, frequency and vaginal pain.  Musculoskeletal:  Positive for arthralgias. Negative for back pain and gait problem.  Skin:  Negative for pallor and rash.  Neurological:  Negative for dizziness, tremors, weakness, numbness and headaches.  Psychiatric/Behavioral:  Negative for confusion and sleep disturbance. The patient is nervous/anxious.     Objective:  BP 120/80 (BP Location: Left Arm, Patient Position: Sitting, Cuff Size: Large)   Pulse 72   Temp 98.6 F (37 C) (Oral)   Ht 5\' 4"  (1.626 m)   Wt 173 lb (78.5 kg)   SpO2 95%   BMI 29.70 kg/m   BP Readings from Last 3 Encounters:  05/05/23 120/80  02/03/23 130/84  12/16/22 122/80    Wt Readings from Last 3 Encounters:  05/05/23 173 lb (78.5 kg)  02/03/23 170 lb (77.1 kg)  12/16/22 169 lb (76.7 kg)    Physical Exam Constitutional:      General: She is not in acute distress.    Appearance: She is well-developed. She is obese.  HENT:     Head: Normocephalic.     Right Ear: External ear normal.     Left  Ear: External ear normal.     Nose: Nose normal.  Eyes:     General:        Right eye: No discharge.        Left eye: No discharge.     Conjunctiva/sclera: Conjunctivae normal.     Pupils: Pupils are equal, round, and reactive to light.  Neck:     Thyroid: No thyromegaly.     Vascular: No JVD.     Trachea: No tracheal deviation.  Cardiovascular:     Rate and Rhythm: Normal rate and regular rhythm.     Heart sounds: Normal heart sounds.  Pulmonary:     Effort: No respiratory distress.     Breath sounds: No stridor. No wheezing.  Abdominal:     General: Bowel sounds are normal. There is no distension.     Palpations: Abdomen is soft. There is no mass.     Tenderness: There is no abdominal tenderness. There is no guarding or rebound.  Musculoskeletal:        General: No  tenderness.     Cervical back: Normal range of motion and neck supple. No rigidity.  Lymphadenopathy:     Cervical: No cervical adenopathy.  Skin:    Findings: No erythema or rash.  Neurological:     Mental Status: She is oriented to person, place, and time.     Cranial Nerves: No cranial nerve deficit.     Motor: No abnormal muscle tone.     Coordination: Coordination normal.     Deep Tendon Reflexes: Reflexes normal.  Psychiatric:        Behavior: Behavior normal.        Thought Content: Thought content normal.        Judgment: Judgment normal.   L shoulder w/pain  Lab Results  Component Value Date   WBC 3.9 (L) 12/14/2022   HGB 14.0 12/14/2022   HCT 41.7 12/14/2022   PLT 240.0 12/14/2022   GLUCOSE 90 12/14/2022   CHOL 243 (H) 12/14/2022   TRIG 93.0 12/14/2022   HDL 90.30 12/14/2022   LDLDIRECT 172.9 04/03/2013   LDLCALC 134 (H) 12/14/2022   ALT 21 12/14/2022   AST 25 12/14/2022   NA 140 12/14/2022   K 3.9 12/14/2022   CL 102 12/14/2022   CREATININE 0.73 12/14/2022   BUN 12 12/14/2022   CO2 29 12/14/2022   TSH 2.51 12/14/2022   HGBA1C 5.9 08/07/2022   MICROALBUR <0.7 05/21/2017    DG Shoulder Left  Result Date: 10/30/2022 CLINICAL DATA:  Fall on left side. EXAM: LEFT SHOULDER - 2+ VIEW COMPARISON:  Left shoulder pain. FINDINGS: There is diffuse decreased bone mineralization. There is a transverse comminuted fracture of the surgical neck of the humerus with minimal approximate 4 mm anterior displacement of the distal fracture component with respect to the proximal fracture component. There is an adjacent comminuted fracture of the greater tuberosity with approximately 6 mm lateral displacement of the peripheral greater tuberosity. Normal glenohumeral and acromioclavicular alignment. Mild moderate calcification within the aortic arch. IMPRESSION: 1. Acute transverse comminuted fracture of the surgical neck of the humerus with minimal anterior displacement of the distal  fracture component with respect to the proximal fracture component. 2. Acute comminuted fracture of the greater tuberosity with approximately 6 mm lateral displacement of the peripheral greater tuberosity. Electronically Signed   By: Neita Garnet M.D.   On: 10/30/2022 14:03   CT Head Wo Contrast  Result Date: 10/30/2022 CLINICAL DATA:  Larey Seat  and hit head. EXAM: CT HEAD WITHOUT CONTRAST TECHNIQUE: Contiguous axial images were obtained from the base of the skull through the vertex without intravenous contrast. RADIATION DOSE REDUCTION: This exam was performed according to the departmental dose-optimization program which includes automated exposure control, adjustment of the mA and/or kV according to patient size and/or use of iterative reconstruction technique. COMPARISON:  No comparison studies available. FINDINGS: Brain: There is no evidence for acute hemorrhage, hydrocephalus, mass lesion, or abnormal extra-axial fluid collection. No definite CT evidence for acute infarction. Diffuse loss of parenchymal volume is consistent with atrophy. Patchy low attenuation in the deep hemispheric and periventricular white matter is nonspecific, but likely reflects chronic microvascular ischemic demyelination. Vascular: No hyperdense vessel or unexpected calcification. Skull: No evidence for fracture. No worrisome lytic or sclerotic lesion. Sinuses/Orbits: Air-fluid level noted left sphenoid sinus. Remaining visualized paranasal sinuses are clear. No mastoid effusion. Visualized portions of the globes and intraorbital fat are unremarkable. Other: None IMPRESSION: 1. No acute intracranial abnormality. 2. Atrophy with chronic small vessel ischemic disease. 3. Air-fluid level in the left sphenoid sinus suggests acute sinusitis. Electronically Signed   By: Kennith Vasquez M.D.   On: 10/30/2022 13:54    Assessment & Plan:   Problem List Items Addressed This Visit     B12 deficiency    On B12      Dyslipidemia   Relevant  Orders   Lipid panel   Essential hypertension - Primary    On Propranolol Cardiac cor calcium CT offered - declined      Relevant Orders   Comprehensive metabolic panel   TSH   GERD    On Omeprazole      Relevant Orders   TSH   Polycythemia    Monitor CBC      Relevant Orders   CBC with Differential/Platelet   TSH   Urinalysis   Hyperglycemia    Check A1c      Relevant Orders   Hemoglobin A1c   Shoulder fracture, left    Healing well. Residual pain. Seeing Dr Rennis Chris      Relevant Orders   TSH      No orders of the defined types were placed in this encounter.     Follow-up: Return in about 3 months (around 08/04/2023) for a follow-up visit.  Sonda Primes, MD

## 2023-05-05 NOTE — Assessment & Plan Note (Signed)
Monitor CBC

## 2023-05-05 NOTE — Assessment & Plan Note (Signed)
On B12 

## 2023-05-05 NOTE — Assessment & Plan Note (Signed)
On Propranolol Cardiac cor calcium CT offered - declined

## 2023-05-05 NOTE — Assessment & Plan Note (Signed)
On Omeprazole

## 2023-05-05 NOTE — Assessment & Plan Note (Signed)
Check A1c. 

## 2023-05-12 ENCOUNTER — Other Ambulatory Visit: Payer: Medicare Other

## 2023-05-12 DIAGNOSIS — R739 Hyperglycemia, unspecified: Secondary | ICD-10-CM | POA: Diagnosis not present

## 2023-05-12 DIAGNOSIS — E785 Hyperlipidemia, unspecified: Secondary | ICD-10-CM

## 2023-05-12 DIAGNOSIS — D751 Secondary polycythemia: Secondary | ICD-10-CM | POA: Diagnosis not present

## 2023-05-12 DIAGNOSIS — I10 Essential (primary) hypertension: Secondary | ICD-10-CM

## 2023-05-12 DIAGNOSIS — K219 Gastro-esophageal reflux disease without esophagitis: Secondary | ICD-10-CM | POA: Diagnosis not present

## 2023-05-12 DIAGNOSIS — S4292XS Fracture of left shoulder girdle, part unspecified, sequela: Secondary | ICD-10-CM

## 2023-05-12 LAB — COMPREHENSIVE METABOLIC PANEL
ALT: 22 U/L (ref 0–35)
AST: 25 U/L (ref 0–37)
Albumin: 4.4 g/dL (ref 3.5–5.2)
Alkaline Phosphatase: 40 U/L (ref 39–117)
BUN: 14 mg/dL (ref 6–23)
CO2: 28 mEq/L (ref 19–32)
Calcium: 9.8 mg/dL (ref 8.4–10.5)
Chloride: 103 mEq/L (ref 96–112)
Creatinine, Ser: 0.81 mg/dL (ref 0.40–1.20)
GFR: 69.11 mL/min (ref >60.00)
Glucose, Bld: 95 mg/dL (ref 70–99)
Potassium: 4.6 mEq/L (ref 3.5–5.1)
Sodium: 139 mEq/L (ref 135–145)
Total Bilirubin: 1 mg/dL (ref 0.2–1.2)
Total Protein: 7.2 g/dL (ref 6.0–8.3)

## 2023-05-12 LAB — URINALYSIS, ROUTINE W REFLEX MICROSCOPIC
Bilirubin Urine: NEGATIVE
Hgb urine dipstick: NEGATIVE
Ketones, ur: NEGATIVE
Nitrite: NEGATIVE
Specific Gravity, Urine: <=1.005 — AB (ref 1.000–1.030)
Total Protein, Urine: NEGATIVE
Urine Glucose: NEGATIVE
Urobilinogen, UA: 0.2 (ref 0.0–1.0)
pH: 7 (ref 5.0–8.0)

## 2023-05-12 LAB — CBC WITH DIFFERENTIAL/PLATELET
Basophils Absolute: 0 10*3/uL (ref 0.0–0.1)
Basophils Relative: 0.7 % (ref 0.0–3.0)
Eosinophils Absolute: 0.1 10*3/uL (ref 0.0–0.7)
Eosinophils Relative: 2.1 % (ref 0.0–5.0)
HCT: 44.1 % (ref 36.0–46.0)
Hemoglobin: 14.4 g/dL (ref 12.0–15.0)
Lymphocytes Relative: 31.7 % (ref 12.0–46.0)
Lymphs Abs: 1.4 10*3/uL (ref 0.7–4.0)
MCHC: 32.6 g/dL (ref 30.0–36.0)
MCV: 92.3 fl (ref 78.0–100.0)
Monocytes Absolute: 0.4 10*3/uL (ref 0.1–1.0)
Monocytes Relative: 8.5 % (ref 3.0–12.0)
Neutro Abs: 2.5 10*3/uL (ref 1.4–7.7)
Neutrophils Relative %: 57 % (ref 43.0–77.0)
Platelets: 240 10*3/uL (ref 150.0–400.0)
RBC: 4.78 Mil/uL (ref 3.87–5.11)
RDW: 14 % (ref 11.5–15.5)
WBC: 4.4 10*3/uL (ref 4.0–10.5)

## 2023-05-12 LAB — LIPID PANEL
Cholesterol: 263 mg/dL — ABNORMAL HIGH (ref 0–200)
HDL: 99.5 mg/dL (ref >39.00)
LDL Cholesterol: 146 mg/dL — ABNORMAL HIGH (ref 0–99)
NonHDL: 163.86
Total CHOL/HDL Ratio: 3
Triglycerides: 91 mg/dL (ref 0.0–149.0)
VLDL: 18.2 mg/dL (ref 0.0–40.0)

## 2023-05-12 LAB — TSH: TSH: 2.74 u[IU]/mL (ref 0.35–5.50)

## 2023-05-12 LAB — HEMOGLOBIN A1C: Hgb A1c MFr Bld: 5.6 % (ref 4.6–6.5)

## 2023-06-01 ENCOUNTER — Ambulatory Visit (INDEPENDENT_AMBULATORY_CARE_PROVIDER_SITE_OTHER): Payer: Medicare Other | Admitting: Radiology

## 2023-06-01 DIAGNOSIS — Z23 Encounter for immunization: Secondary | ICD-10-CM

## 2023-06-01 NOTE — Progress Notes (Cosign Needed Addendum)
Patient here for regular Flu shot. Patient was not sure which dose to receive and mentioned she was advised to get HD flu but wanted reg. Flu. Patient tolerated well with no complications.  Medical screening examination/treatment/procedure(s) were performed by non-physician practitioner and as supervising physician I was immediately available for consultation/collaboration.  I agree with above. Jacinta Shoe, MD

## 2023-06-24 ENCOUNTER — Other Ambulatory Visit: Payer: Self-pay | Admitting: Internal Medicine

## 2023-06-25 ENCOUNTER — Encounter: Payer: Self-pay | Admitting: Family Medicine

## 2023-06-25 ENCOUNTER — Ambulatory Visit (INDEPENDENT_AMBULATORY_CARE_PROVIDER_SITE_OTHER): Payer: Medicare Other | Admitting: Family Medicine

## 2023-06-25 VITALS — BP 132/92 | HR 68 | Temp 98.5°F | Ht 64.0 in | Wt 170.8 lb

## 2023-06-25 DIAGNOSIS — F439 Reaction to severe stress, unspecified: Secondary | ICD-10-CM | POA: Diagnosis not present

## 2023-06-25 DIAGNOSIS — I10 Essential (primary) hypertension: Secondary | ICD-10-CM | POA: Diagnosis not present

## 2023-06-25 NOTE — Patient Instructions (Signed)
I recommend taking your propranolol more consistently.   Use diazepam you have at home as needed for severe anxiety.   Remember to breath- Breathe in for 4 seconds Hold breath for 3 seconds  Breathe out for 5 seconds  Follow up with Dr. Posey Rea

## 2023-06-25 NOTE — Progress Notes (Signed)
Subjective:     Patient ID: Laura Vasquez, female    DOB: [birth year], 77 y.o.   MRN: [id number]  Chief Complaint  Patient presents with   Hypertension    Patients states on the way here her b/p 168/86. This has been going on for a couple of days on and off, pt states it has gotten better. Patient did write down bps.her son had a stroke 2 years ago and has been stressed from that. She states only some headaches she believes is sinus related.     Hypertension Pertinent negatives include no blurred vision, chest pain, headaches, palpitations or shortness of breath.    History of Present Illness          Here with concerns regarding elev BPs at home over the past 2 weeks.  Readings 140s-180s/80-108  She is taking propranolol but not consistently.   States she has diazepam at home and she used to take this.  She questions whether she could start taking it again.  Reports significant stress at home.  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     Health Maintenance Due  Topic Date Due   Medicare Annual Wellness (AWV)  Never done   DTaP/Tdap/Td (2 - Tdap) 06/12/2020    Past Medical History:  Diagnosis Date   Allergy    Anxiety    Arrhythmia    Diverticulosis    Erosive esophagitis    Fatty liver    Fibromyalgia    GERD (gastroesophageal reflux disease)    Hiatal hernia    Hyperlipidemia    Hyperplastic colon polyp    Hypertension    IBS (irritable bowel syndrome)    Mitral regurgitation    Osteoarthritis    Palpitations    Peptic stricture of esophagus    Vitamin B12 deficiency     Past Surgical History:  Procedure Laterality Date   ABDOMINAL HYSTERECTOMY     APPENDECTOMY     CHOLECYSTECTOMY     RECTOCELE REPAIR     TUBAL LIGATION      Family History  Problem Relation Age of Onset   Hypertension Mother    Ovarian cancer Mother    Hypertension Father    Kidney disease Sister        ESRD   Fibromyalgia Sister        x 2   Diabetes Sister    Breast cancer  Maternal Grandmother    Colon cancer Neg Hx     Social History   Socioeconomic History   Marital status: Divorced    Spouse name: Not on file   Number of children: 2   Years of education: Not on file   Highest education level: Not on file  Occupational History   Occupation: Retired     Associate Professor: RETIRED  Tobacco Use   Smoking status: Former    Current packs/day: 0.00    Types: Cigarettes    Quit date: 08/18/1991    Years since quitting: 31.8   Smokeless tobacco: Never  Vaping Use   Vaping status: Never Used  Substance and Sexual Activity   Alcohol use: Yes    Alcohol/week: 0.0 standard drinks of alcohol    Comment: 1-2 glasses    Drug use: No   Sexual activity: Not Currently  Other Topics Concern   Not on file  Social History Narrative   1-2 caffeine drinks daily   Social Determinants of Health   Financial Resource Strain: Not on file  Food  Insecurity: Not on file  Transportation Needs: Not on file  Physical Activity: Not on file  Stress: Not on file  Social Connections: Not on file  Intimate Partner Violence: Not on file    Outpatient Medications Prior to Visit  Medication Sig Dispense Refill   aspirin EC 81 MG tablet Take 81 mg by mouth daily.     B Complex Vitamins (B COMPLEX PO) Take 1 tablet by mouth daily as needed.      calcium carbonate (TUMS - DOSED IN MG ELEMENTAL CALCIUM) 500 MG chewable tablet Chew 1 tablet by mouth as needed.      cholecalciferol (VITAMIN D) 1000 UNITS tablet Take 2,000 Units by mouth daily.     cyanocobalamin 500 MCG tablet Take 500 mcg by mouth daily as needed (Alternate b/t vitamin b complex).      cyclobenzaprine (FLEXERIL) 10 MG tablet Take 10 mg by mouth every 6 (six) hours as needed.     diphenhydrAMINE (BENADRYL ALLERGY CHILDRENS) 12.5 MG chewable tablet Chew 1 tablet (12.5 mg total) by mouth at bedtime as needed (cramps). 30 tablet 0   HYDROcodone-acetaminophen (NORCO) 5-325 MG tablet Take 1 tablet by mouth every 6 (six)  hours as needed for moderate pain. 20 tablet 0   MAGNESIUM PO Take 800 mg by mouth.     meclizine (ANTIVERT) 25 MG tablet Take 1 tablet (25 mg total) by mouth 3 (three) times daily as needed for dizziness. 30 tablet 1   Omega-3 Fatty Acids (FISH OIL PO) Take 1-2 each by mouth daily as needed.     omeprazole (PRILOSEC) 20 MG capsule TAKE 1 CAPSULE BY MOUTH EVERY DAY 30 capsule 11   propranolol (INDERAL) 10 MG tablet Take 1 tablet (10 mg total) by mouth 2 (two) times daily. 60 tablet 5   Psyllium (METAMUCIL PO) Take by mouth daily. 1 tablespoon     No facility-administered medications prior to visit.    Allergies  Allergen Reactions   Amoxicillin-Pot Clavulanate Nausea And Vomiting    Can take plain amoxicillin ok   Ceftin [Cefuroxime Axetil]     Lips burning   Codeine Nausea Only   Naproxen Nausea Only   Sulfonamide Derivatives     Unknown     Review of Systems  Constitutional:  Negative for chills and fever.  Eyes:  Negative for blurred vision and double vision.  Respiratory:  Negative for shortness of breath.   Cardiovascular:  Negative for chest pain, palpitations and leg swelling.  Gastrointestinal:  Negative for abdominal pain, constipation, diarrhea, nausea and vomiting.  Neurological:  Negative for dizziness, focal weakness and headaches.  Psychiatric/Behavioral:  The patient is nervous/anxious.        Objective:    Physical Exam Constitutional:      General: She is not in acute distress.    Appearance: She is not ill-appearing.  HENT:     Mouth/Throat:     Mouth: Mucous membranes are moist.     Pharynx: Oropharynx is clear.  Eyes:     Extraocular Movements: Extraocular movements intact.     Conjunctiva/sclera: Conjunctivae normal.  Cardiovascular:     Rate and Rhythm: Normal rate and regular rhythm.  Pulmonary:     Effort: Pulmonary effort is normal.     Breath sounds: Normal breath sounds.  Musculoskeletal:     Cervical back: Normal range of motion and  neck supple.     Right lower leg: No edema.     Left lower leg: No edema.  Skin:    General: Skin is warm and dry.  Neurological:     General: No focal deficit present.     Mental Status: She is alert and oriented to person, place, and time.     Motor: No weakness.     Coordination: Coordination normal.     Gait: Gait normal.  Psychiatric:        Mood and Affect: Mood normal.        Behavior: Behavior normal.        Thought Content: Thought content normal.      BP (!) 132/92 (BP Location: Right Arm, Patient Position: Sitting, Cuff Size: Normal)   Pulse 68   Temp 98.5 F (36.9 C) (Oral)   Ht 5\' 4"  (1.626 m)   Wt 170 lb 12.8 oz (77.5 kg)   SpO2 95%   BMI 29.32 kg/m  Wt Readings from Last 3 Encounters:  06/25/23 170 lb 12.8 oz (77.5 kg)  05/05/23 173 lb (78.5 kg)  02/03/23 170 lb (77.1 kg)       Assessment & Plan:   Problem List Items Addressed This Visit       Cardiovascular and Mediastinum   Essential hypertension     Other   Stress at home   Other Visit Diagnoses     Elevated blood pressure reading in office with diagnosis of hypertension    -  Primary      She is here with elevated blood pressures.  She admits that she has not been taking her blood pressure medication, propranolol, as prescribed.  She takes it sporadically and often only takes 1/2 tablet.   No red flag symptoms.   Her blood pressure improved during her visit. Reviewed previous PCP notes and lab results with her. Counseling on stress reduction and techniques/methods to cope with stress. Encouraged her to take her blood pressure medication consistently.  We discussed starting a different medication for hypertension at a very low dose but she declines.  She prefers to take what she has been prescribed. She has an old prescription of diazepam at home and would like to start taking this again for anxiety.  She will discuss this with her PCP at her follow-up appointment.   I am having Laura Vasquez maintain her Omega-3 Fatty Acids (FISH OIL PO), cholecalciferol, MAGNESIUM PO, calcium carbonate, aspirin EC, cyanocobalamin, B Complex Vitamins (B COMPLEX PO), meclizine, Psyllium (METAMUCIL PO), diphenhydrAMINE, propranolol, omeprazole, HYDROcodone-acetaminophen, and cyclobenzaprine.  No orders of the defined types were placed in this encounter.

## 2023-08-05 ENCOUNTER — Encounter: Payer: Self-pay | Admitting: Internal Medicine

## 2023-08-05 ENCOUNTER — Ambulatory Visit: Payer: Medicare Other | Admitting: Internal Medicine

## 2023-08-05 VITALS — BP 130/80 | HR 78 | Temp 98.4°F | Ht 64.0 in | Wt 175.0 lb

## 2023-08-05 DIAGNOSIS — K589 Irritable bowel syndrome without diarrhea: Secondary | ICD-10-CM | POA: Diagnosis not present

## 2023-08-05 DIAGNOSIS — F411 Generalized anxiety disorder: Secondary | ICD-10-CM

## 2023-08-05 DIAGNOSIS — E538 Deficiency of other specified B group vitamins: Secondary | ICD-10-CM

## 2023-08-05 DIAGNOSIS — E785 Hyperlipidemia, unspecified: Secondary | ICD-10-CM | POA: Diagnosis not present

## 2023-08-05 DIAGNOSIS — I10 Essential (primary) hypertension: Secondary | ICD-10-CM

## 2023-08-05 MED ORDER — PROPRANOLOL HCL 10 MG PO TABS: 10.0000 mg | ORAL_TABLET | Freq: Two times a day (BID) | ORAL | 3 refills | Status: DC

## 2023-08-05 MED ORDER — OMEPRAZOLE 20 MG PO CPDR: DELAYED_RELEASE_CAPSULE | ORAL | 3 refills | Status: DC

## 2023-08-05 NOTE — Assessment & Plan Note (Signed)
On Propranolol Cardiac cor calcium CT offered - declined

## 2023-08-05 NOTE — Assessment & Plan Note (Signed)
Chronic  Dr Elease Hashimoto

## 2023-08-05 NOTE — Progress Notes (Signed)
Subjective:  Patient ID: Laura Vasquez, female    DOB: November 09, 1945  Age: 77 y.o. MRN: [id number]  CC: No chief complaint on file.   HPI Shenaya Polasek [facility] presents for HTN, GERD, allergies SBP 130-140 most of the time Stress at home  Outpatient Medications Prior to Visit  Medication Sig Dispense Refill   aspirin EC 81 MG tablet Take 81 mg by mouth daily.     B Complex Vitamins (B COMPLEX PO) Take 1 tablet by mouth daily as needed.      calcium carbonate (TUMS - DOSED IN MG ELEMENTAL CALCIUM) 500 MG chewable tablet Chew 1 tablet by mouth as needed.      cholecalciferol (VITAMIN D) 1000 UNITS tablet Take 2,000 Units by mouth daily.     cyanocobalamin 500 MCG tablet Take 500 mcg by mouth daily as needed (Alternate b/t vitamin b complex).      cyclobenzaprine (FLEXERIL) 10 MG tablet Take 10 mg by mouth every 6 (six) hours as needed.     diphenhydrAMINE (BENADRYL ALLERGY CHILDRENS) 12.5 MG chewable tablet Chew 1 tablet (12.5 mg total) by mouth at bedtime as needed (cramps). 30 tablet 0   HYDROcodone-acetaminophen (NORCO) 5-325 MG tablet Take 1 tablet by mouth every 6 (six) hours as needed for moderate pain. 20 tablet 0   MAGNESIUM PO Take 800 mg by mouth.     meclizine (ANTIVERT) 25 MG tablet Take 1 tablet (25 mg total) by mouth 3 (three) times daily as needed for dizziness. 30 tablet 1   Omega-3 Fatty Acids (FISH OIL PO) Take 1-2 each by mouth daily as needed.     Psyllium (METAMUCIL PO) Take by mouth daily. 1 tablespoon     omeprazole (PRILOSEC) 20 MG capsule TAKE 1 CAPSULE BY MOUTH EVERY DAY 30 capsule 11   propranolol (INDERAL) 10 MG tablet Take 1 tablet (10 mg total) by mouth 2 (two) times daily. 60 tablet 5   No facility-administered medications prior to visit.    ROS: Review of Systems  Constitutional:  Positive for fatigue. Negative for activity change, appetite change, chills and unexpected weight change.  HENT:  Negative for congestion, mouth sores and sinus pressure.   Eyes:   Negative for visual disturbance.  Respiratory:  Negative for cough and chest tightness.   Cardiovascular:  Positive for palpitations.  Gastrointestinal:  Negative for abdominal pain and nausea.  Genitourinary:  Negative for difficulty urinating, frequency and vaginal pain.  Musculoskeletal:  Positive for arthralgias. Negative for back pain and gait problem.  Skin:  Negative for pallor and rash.  Neurological:  Negative for dizziness, tremors, weakness, numbness and headaches.  Psychiatric/Behavioral:  Negative for confusion, sleep disturbance and suicidal ideas. The patient is nervous/anxious.     Objective:  BP 130/80 (BP Location: Left Arm, Patient Position: Sitting, Cuff Size: Normal)   Pulse 78   Temp 98.4 F (36.9 C) (Oral)   Ht 5\' 4"  (1.626 m)   Wt 175 lb (79.4 kg)   SpO2 95%   BMI 30.04 kg/m   BP Readings from Last 3 Encounters:  08/05/23 130/80  06/25/23 (!) 132/92  05/05/23 120/80    Wt Readings from Last 3 Encounters:  08/05/23 175 lb (79.4 kg)  06/25/23 170 lb 12.8 oz (77.5 kg)  05/05/23 173 lb (78.5 kg)    Physical Exam Constitutional:      General: She is not in acute distress.    Appearance: She is well-developed.  HENT:     Head: Normocephalic.  Right Ear: External ear normal.     Left Ear: External ear normal.     Nose: Nose normal.  Eyes:     General:        Right eye: No discharge.        Left eye: No discharge.     Conjunctiva/sclera: Conjunctivae normal.     Pupils: Pupils are equal, round, and reactive to light.  Neck:     Thyroid: No thyromegaly.     Vascular: No JVD.     Trachea: No tracheal deviation.  Cardiovascular:     Rate and Rhythm: Normal rate and regular rhythm.     Heart sounds: Normal heart sounds.  Pulmonary:     Effort: No respiratory distress.     Breath sounds: No stridor. No wheezing.  Abdominal:     General: Bowel sounds are normal. There is no distension.     Palpations: Abdomen is soft. There is no mass.      Tenderness: There is no abdominal tenderness. There is no guarding or rebound.  Musculoskeletal:        General: No tenderness.     Cervical back: Normal range of motion and neck supple. No rigidity.  Lymphadenopathy:     Cervical: No cervical adenopathy.  Skin:    Findings: No erythema or rash.  Neurological:     Cranial Nerves: No cranial nerve deficit.     Motor: No abnormal muscle tone.     Coordination: Coordination normal.     Deep Tendon Reflexes: Reflexes normal.  Psychiatric:        Behavior: Behavior normal.        Thought Content: Thought content normal.        Judgment: Judgment normal.     Lab Results  Component Value Date   WBC 4.4 05/12/2023   HGB 14.4 05/12/2023   HCT 44.1 05/12/2023   PLT 240.0 05/12/2023   GLUCOSE 95 05/12/2023   CHOL 263 (H) 05/12/2023   TRIG 91.0 05/12/2023   HDL 99.50 05/12/2023   LDLDIRECT 172.9 04/03/2013   LDLCALC 146 (H) 05/12/2023   ALT 22 05/12/2023   AST 25 05/12/2023   NA 139 05/12/2023   K 4.6 05/12/2023   CL 103 05/12/2023   CREATININE 0.81 05/12/2023   BUN 14 05/12/2023   CO2 28 05/12/2023   TSH 2.74 05/12/2023   HGBA1C 5.6 05/12/2023   MICROALBUR <0.7 05/21/2017    DG Shoulder Left Result Date: 10/30/2022 CLINICAL DATA:  Fall on left side. EXAM: LEFT SHOULDER - 2+ VIEW COMPARISON:  Left shoulder pain. FINDINGS: There is diffuse decreased bone mineralization. There is a transverse comminuted fracture of the surgical neck of the humerus with minimal approximate 4 mm anterior displacement of the distal fracture component with respect to the proximal fracture component. There is an adjacent comminuted fracture of the greater tuberosity with approximately 6 mm lateral displacement of the peripheral greater tuberosity. Normal glenohumeral and acromioclavicular alignment. Mild moderate calcification within the aortic arch. IMPRESSION: 1. Acute transverse comminuted fracture of the surgical neck of the humerus with minimal  anterior displacement of the distal fracture component with respect to the proximal fracture component. 2. Acute comminuted fracture of the greater tuberosity with approximately 6 mm lateral displacement of the peripheral greater tuberosity. Electronically Signed   By: Neita Garnet M.D.   On: 10/30/2022 14:03   CT Head Wo Contrast Result Date: 10/30/2022 CLINICAL DATA:  Larey Seat and hit head. EXAM: CT HEAD WITHOUT CONTRAST TECHNIQUE:  Contiguous axial images were obtained from the base of the skull through the vertex without intravenous contrast. RADIATION DOSE REDUCTION: This exam was performed according to the departmental dose-optimization program which includes automated exposure control, adjustment of the mA and/or kV according to patient size and/or use of iterative reconstruction technique. COMPARISON:  No comparison studies available. FINDINGS: Brain: There is no evidence for acute hemorrhage, hydrocephalus, mass lesion, or abnormal extra-axial fluid collection. No definite CT evidence for acute infarction. Diffuse loss of parenchymal volume is consistent with atrophy. Patchy low attenuation in the deep hemispheric and periventricular white matter is nonspecific, but likely reflects chronic microvascular ischemic demyelination. Vascular: No hyperdense vessel or unexpected calcification. Skull: No evidence for fracture. No worrisome lytic or sclerotic lesion. Sinuses/Orbits: Air-fluid level noted left sphenoid sinus. Remaining visualized paranasal sinuses are clear. No mastoid effusion. Visualized portions of the globes and intraorbital fat are unremarkable. Other: None IMPRESSION: 1. No acute intracranial abnormality. 2. Atrophy with chronic small vessel ischemic disease. 3. Air-fluid level in the left sphenoid sinus suggests acute sinusitis. Electronically Signed   By: Kennith Center M.D.   On: 10/30/2022 13:54    Assessment & Plan:   Problem List Items Addressed This Visit     B12 deficiency - Primary    On B12      Dyslipidemia   Chronic  Dr Elease Hashimoto      RESOLVED: Generalized anxiety disorder   On Propranolol      Essential hypertension   On Propranolol Cardiac cor calcium CT offered - declined      Relevant Medications   propranolol (INDERAL) 10 MG tablet   Irritable bowel syndrome    On Prilosec      Relevant Medications   omeprazole (PRILOSEC) 20 MG capsule      Meds ordered this encounter  Medications   propranolol (INDERAL) 10 MG tablet    Sig: Take 1 tablet (10 mg total) by mouth 2 (two) times daily.    Dispense:  180 tablet    Refill:  3   omeprazole (PRILOSEC) 20 MG capsule    Sig: TAKE 1 CAPSULE BY MOUTH EVERY DAY    Dispense:  90 capsule    Refill:  3      Follow-up: Return in about 3 months (around 11/03/2023) for a follow-up visit.  Sonda Primes, MD

## 2023-08-05 NOTE — Assessment & Plan Note (Signed)
On Prilosec

## 2023-08-05 NOTE — Assessment & Plan Note (Signed)
On Propranolol 

## 2023-08-05 NOTE — Assessment & Plan Note (Signed)
On B12 

## 2023-12-07 ENCOUNTER — Ambulatory Visit (INDEPENDENT_AMBULATORY_CARE_PROVIDER_SITE_OTHER): Payer: Medicare Other | Admitting: Internal Medicine

## 2023-12-07 ENCOUNTER — Encounter: Payer: Self-pay | Admitting: Internal Medicine

## 2023-12-07 VITALS — BP 156/100 | HR 79 | Temp 97.7°F | Ht 64.0 in | Wt 174.0 lb

## 2023-12-07 DIAGNOSIS — I10 Essential (primary) hypertension: Secondary | ICD-10-CM | POA: Diagnosis not present

## 2023-12-07 DIAGNOSIS — R739 Hyperglycemia, unspecified: Secondary | ICD-10-CM

## 2023-12-07 DIAGNOSIS — F439 Reaction to severe stress, unspecified: Secondary | ICD-10-CM

## 2023-12-07 DIAGNOSIS — E538 Deficiency of other specified B group vitamins: Secondary | ICD-10-CM | POA: Diagnosis not present

## 2023-12-07 DIAGNOSIS — M1990 Unspecified osteoarthritis, unspecified site: Secondary | ICD-10-CM | POA: Insufficient documentation

## 2023-12-07 DIAGNOSIS — M1991 Primary osteoarthritis, unspecified site: Secondary | ICD-10-CM

## 2023-12-07 DIAGNOSIS — R4589 Other symptoms and signs involving emotional state: Secondary | ICD-10-CM | POA: Diagnosis not present

## 2023-12-07 DIAGNOSIS — E785 Hyperlipidemia, unspecified: Secondary | ICD-10-CM

## 2023-12-07 LAB — URINALYSIS
Bilirubin Urine: NEGATIVE
Hgb urine dipstick: NEGATIVE
Ketones, ur: NEGATIVE
Leukocytes,Ua: NEGATIVE
Nitrite: NEGATIVE
Specific Gravity, Urine: <=1.005 — AB (ref 1.000–1.030)
Total Protein, Urine: NEGATIVE
Urine Glucose: NEGATIVE
Urobilinogen, UA: 0.2 (ref 0.0–1.0)
pH: 6.5 (ref 5.0–8.0)

## 2023-12-07 LAB — COMPREHENSIVE METABOLIC PANEL WITH GFR
ALT: 23 U/L (ref 0–35)
AST: 30 U/L (ref 0–37)
Albumin: 4.7 g/dL (ref 3.5–5.2)
Alkaline Phosphatase: 37 U/L — ABNORMAL LOW (ref 39–117)
BUN: 10 mg/dL (ref 6–23)
CO2: 27 meq/L (ref 19–32)
Calcium: 9.9 mg/dL (ref 8.4–10.5)
Chloride: 103 meq/L (ref 96–112)
Creatinine, Ser: 0.77 mg/dL (ref 0.40–1.20)
GFR: 73.15 mL/min (ref >60.00)
Glucose, Bld: 102 mg/dL — ABNORMAL HIGH (ref 70–99)
Potassium: 4.5 meq/L (ref 3.5–5.1)
Sodium: 140 meq/L (ref 135–145)
Total Bilirubin: 1.3 mg/dL — ABNORMAL HIGH (ref 0.2–1.2)
Total Protein: 7.7 g/dL (ref 6.0–8.3)

## 2023-12-07 LAB — CBC WITH DIFFERENTIAL/PLATELET
Basophils Absolute: 0 10*3/uL (ref 0.0–0.1)
Basophils Relative: 0.5 % (ref 0.0–3.0)
Eosinophils Absolute: 0.1 10*3/uL (ref 0.0–0.7)
Eosinophils Relative: 1.7 % (ref 0.0–5.0)
HCT: 45.6 % (ref 36.0–46.0)
Hemoglobin: 15.1 g/dL — ABNORMAL HIGH (ref 12.0–15.0)
Lymphocytes Relative: 27.9 % (ref 12.0–46.0)
Lymphs Abs: 1.2 10*3/uL (ref 0.7–4.0)
MCHC: 33 g/dL (ref 30.0–36.0)
MCV: 92.3 fl (ref 78.0–100.0)
Monocytes Absolute: 0.3 10*3/uL (ref 0.1–1.0)
Monocytes Relative: 7.2 % (ref 3.0–12.0)
Neutro Abs: 2.6 10*3/uL (ref 1.4–7.7)
Neutrophils Relative %: 62.7 % (ref 43.0–77.0)
Platelets: 223 10*3/uL (ref 150.0–400.0)
RBC: 4.94 Mil/uL (ref 3.87–5.11)
RDW: 14 % (ref 11.5–15.5)
WBC: 4.2 10*3/uL (ref 4.0–10.5)

## 2023-12-07 LAB — VITAMIN B12: Vitamin B-12: 675 pg/mL (ref 211–911)

## 2023-12-07 LAB — TSH: TSH: 2.34 u[IU]/mL (ref 0.35–5.50)

## 2023-12-07 LAB — HEMOGLOBIN A1C: Hgb A1c MFr Bld: 5.8 % (ref 4.6–6.5)

## 2023-12-07 NOTE — Assessment & Plan Note (Signed)
 On B12

## 2023-12-07 NOTE — Assessment & Plan Note (Signed)
Worse due to stress w/her sick son (he had CVA), aphasia

## 2023-12-07 NOTE — Assessment & Plan Note (Signed)
Discussed   L shoulder fx - in PT; seeing Dr Rennis Chris

## 2023-12-07 NOTE — Assessment & Plan Note (Signed)
 Pt declined Rx meds  See orders  See a chiropractor  Start yoga

## 2023-12-07 NOTE — Assessment & Plan Note (Addendum)
 Re-start  Propranolol 

## 2023-12-07 NOTE — Progress Notes (Signed)
 Subjective:  Patient ID: Laura Vasquez, female    DOB: June 23, 1946  Age: 78 y.o. MRN: [id number]  CC: Medical Management of Chronic Issues (3 Month follow up. Would like to go over medication list for potential adjustments)   HPI Laura Vasquez [facility] presents for HTN, B12 def, GERD, stress C/o OA and aches, especially L arm, gait has changed Pt stopperr  Outpatient Medications Prior to Visit  Medication Sig Dispense Refill   aspirin EC 81 MG tablet Take 81 mg by mouth daily.     B Complex Vitamins (B COMPLEX PO) Take 1 tablet by mouth daily as needed.      calcium carbonate (TUMS - DOSED IN MG ELEMENTAL CALCIUM) 500 MG chewable tablet Chew 1 tablet by mouth as needed.      cholecalciferol (VITAMIN D ) 1000 UNITS tablet Take 2,000 Units by mouth daily.     cyanocobalamin  500 MCG tablet Take 500 mcg by mouth daily as needed (Alternate b/t vitamin b complex).      cyclobenzaprine (FLEXERIL) 10 MG tablet Take 10 mg by mouth every 6 (six) hours as needed.     diphenhydrAMINE  (BENADRYL  ALLERGY CHILDRENS) 12.5 MG chewable tablet Chew 1 tablet (12.5 mg total) by mouth at bedtime as needed (cramps). 30 tablet 0   HYDROcodone -acetaminophen  (NORCO) 5-325 MG tablet Take 1 tablet by mouth every 6 (six) hours as needed for moderate pain. 20 tablet 0   MAGNESIUM PO Take 800 mg by mouth.     meclizine  (ANTIVERT ) 25 MG tablet Take 1 tablet (25 mg total) by mouth 3 (three) times daily as needed for dizziness. 30 tablet 1   Omega-3 Fatty Acids (FISH OIL PO) Take 1-2 each by mouth daily as needed.     omeprazole  (PRILOSEC) 20 MG capsule TAKE 1 CAPSULE BY MOUTH EVERY DAY 90 capsule 3   propranolol  (INDERAL ) 10 MG tablet Take 1 tablet (10 mg total) by mouth 2 (two) times daily. 180 tablet 3   Psyllium (METAMUCIL PO) Take by mouth daily. 1 tablespoon     No facility-administered medications prior to visit.    ROS: Review of Systems  Constitutional:  Negative for activity change, appetite change, chills, fatigue  and unexpected weight change.  HENT:  Negative for congestion, mouth sores and sinus pressure.   Eyes:  Negative for visual disturbance.  Respiratory:  Negative for cough and chest tightness.   Gastrointestinal:  Negative for abdominal pain and nausea.  Genitourinary:  Negative for difficulty urinating, frequency and vaginal pain.  Musculoskeletal:  Positive for arthralgias. Negative for back pain and gait problem.  Skin:  Negative for pallor and rash.  Neurological:  Negative for dizziness, tremors, weakness, numbness and headaches.  Psychiatric/Behavioral:  Negative for confusion, sleep disturbance and suicidal ideas.     Objective:  BP (!) 156/100   Pulse 79   Temp 97.7 F (36.5 C)   Ht 5\' 4"  (1.626 m)   Wt 174 lb (78.9 kg)   SpO2 98%   BMI 29.87 kg/m   BP Readings from Last 3 Encounters:  12/07/23 (!) 156/100  08/05/23 130/80  06/25/23 (!) 132/92    Wt Readings from Last 3 Encounters:  12/07/23 174 lb (78.9 kg)  08/05/23 175 lb (79.4 kg)  06/25/23 170 lb 12.8 oz (77.5 kg)    Physical Exam Constitutional:      General: She is not in acute distress.    Appearance: Normal appearance. She is well-developed.  HENT:     Head: Normocephalic.  Right Ear: External ear normal.     Left Ear: External ear normal.     Nose: Nose normal.  Eyes:     General:        Right eye: No discharge.        Left eye: No discharge.     Conjunctiva/sclera: Conjunctivae normal.     Pupils: Pupils are equal, round, and reactive to light.  Neck:     Thyroid : No thyromegaly.     Vascular: No JVD.     Trachea: No tracheal deviation.  Cardiovascular:     Rate and Rhythm: Normal rate and regular rhythm.     Heart sounds: Normal heart sounds.  Pulmonary:     Effort: No respiratory distress.     Breath sounds: No stridor. No wheezing.  Abdominal:     General: Bowel sounds are normal. There is no distension.     Palpations: Abdomen is soft. There is no mass.     Tenderness: There is  no abdominal tenderness. There is no guarding or rebound.  Musculoskeletal:        General: No tenderness.     Cervical back: Normal range of motion and neck supple. No rigidity.  Lymphadenopathy:     Cervical: No cervical adenopathy.  Skin:    Findings: No erythema or rash.  Neurological:     Cranial Nerves: No cranial nerve deficit.     Motor: No abnormal muscle tone.     Coordination: Coordination normal.     Gait: Gait normal.     Deep Tendon Reflexes: Reflexes normal.  Psychiatric:        Behavior: Behavior normal.        Thought Content: Thought content normal.        Judgment: Judgment normal.   Leaning towards left, stiff  Lab Results  Component Value Date   WBC 4.4 05/12/2023   HGB 14.4 05/12/2023   HCT 44.1 05/12/2023   PLT 240.0 05/12/2023   GLUCOSE 95 05/12/2023   CHOL 263 (H) 05/12/2023   TRIG 91.0 05/12/2023   HDL 99.50 05/12/2023   LDLDIRECT 172.9 04/03/2013   LDLCALC 146 (H) 05/12/2023   ALT 22 05/12/2023   AST 25 05/12/2023   NA 139 05/12/2023   K 4.6 05/12/2023   CL 103 05/12/2023   CREATININE 0.81 05/12/2023   BUN 14 05/12/2023   CO2 28 05/12/2023   TSH 2.74 05/12/2023   HGBA1C 5.6 05/12/2023   MICROALBUR <0.7 05/21/2017    DG Shoulder Left Result Date: 10/30/2022 CLINICAL DATA:  Fall on left side. EXAM: LEFT SHOULDER - 2+ VIEW COMPARISON:  Left shoulder pain. FINDINGS: There is diffuse decreased bone mineralization. There is a transverse comminuted fracture of the surgical neck of the humerus with minimal approximate 4 mm anterior displacement of the distal fracture component with respect to the proximal fracture component. There is an adjacent comminuted fracture of the greater tuberosity with approximately 6 mm lateral displacement of the peripheral greater tuberosity. Normal glenohumeral and acromioclavicular alignment. Mild moderate calcification within the aortic arch. IMPRESSION: 1. Acute transverse comminuted fracture of the surgical neck of  the humerus with minimal anterior displacement of the distal fracture component with respect to the proximal fracture component. 2. Acute comminuted fracture of the greater tuberosity with approximately 6 mm lateral displacement of the peripheral greater tuberosity. Electronically Signed   By: Bertina Broccoli M.D.   On: 10/30/2022 14:03   CT Head Wo Contrast Result Date: 10/30/2022 CLINICAL DATA:  Fell and hit head. EXAM: CT HEAD WITHOUT CONTRAST TECHNIQUE: Contiguous axial images were obtained from the base of the skull through the vertex without intravenous contrast. RADIATION DOSE REDUCTION: This exam was performed according to the departmental dose-optimization program which includes automated exposure control, adjustment of the mA and/or kV according to patient size and/or use of iterative reconstruction technique. COMPARISON:  No comparison studies available. FINDINGS: Brain: There is no evidence for acute hemorrhage, hydrocephalus, mass lesion, or abnormal extra-axial fluid collection. No definite CT evidence for acute infarction. Diffuse loss of parenchymal volume is consistent with atrophy. Patchy low attenuation in the deep hemispheric and periventricular white matter is nonspecific, but likely reflects chronic microvascular ischemic demyelination. Vascular: No hyperdense vessel or unexpected calcification. Skull: No evidence for fracture. No worrisome lytic or sclerotic lesion. Sinuses/Orbits: Air-fluid level noted left sphenoid sinus. Remaining visualized paranasal sinuses are clear. No mastoid effusion. Visualized portions of the globes and intraorbital fat are unremarkable. Other: None IMPRESSION: 1. No acute intracranial abnormality. 2. Atrophy with chronic small vessel ischemic disease. 3. Air-fluid level in the left sphenoid sinus suggests acute sinusitis. Electronically Signed   By: Donnal Fusi M.D.   On: 10/30/2022 13:54    Assessment & Plan:   Problem List Items Addressed This Visit      B12 deficiency - Primary   On B12      Relevant Orders   CBC with Differential/Platelet   Vitamin B12   Dyslipidemia   Essential hypertension   Re-start  Propranolol        Relevant Orders   TSH   Urinalysis   Hyperglycemia   Relevant Orders   Comprehensive metabolic panel with GFR   Hemoglobin A1c   Depressed mood   Worse due to stress w/her sick son (he had CVA), aphasia      Relevant Orders   TSH   CBC with Differential/Platelet   Stress at home   Discussed   L shoulder fx - in PT; seeing Dr Alfredo Ano      Osteoarthritis   Pt declined Rx meds  See orders  See a chiropractor  Start yoga         No orders of the defined types were placed in this encounter.     Follow-up: Return in about 3 months (around 03/07/2024) for a follow-up visit.  Anitra Barn, MD

## 2023-12-07 NOTE — Patient Instructions (Signed)
 Valerian root   USEFUL THINGS FOR ARTHRITIS and musculoskeletal pains:    A "rice sock heating pad" refers to a homemade heating pad created by filling a sock with uncooked rice, which can be heated in a microwave to provide a warm compress for sore muscles, pain relief, or other applications; essentially, it's a simple way to generate heat using readily available materials.  Key points about rice sock heat: How to make it: Fill a clean sock (preferably a tube sock) about 2/3 full with uncooked rice, tie a knot at the top to secure the rice inside.  Heating it up: Place the rice sock in the microwave and heat in short intervals (usually around 30 seconds at a time) until it reaches the desired warmth.  Important considerations: Check temperature before applying: Always test the temperature of the rice sock before applying it to your skin to avoid burns.  Use a towel to protect skin: Wrap the rice sock in a thin towel to distribute the heat evenly and protect your skin.  Uses: Muscle aches and pains  Menstrual cramps  Neck pain  Arthritis discomfort   SILICONE PADS: Use them to open jars and bottles    BRIX JAR OPENER: Use them to open jars    NITRILE COATED GARDEN GLOVES: Use down to open jars, lift boxes, etc.   THUMB BRACE: Use it for thumb arthritis flareup pain     BLUE EMU CREAM: Use it 2-3 times a day on painful areas

## 2024-02-03 ENCOUNTER — Encounter: Payer: Self-pay | Admitting: Internal Medicine

## 2024-02-03 ENCOUNTER — Telehealth: Payer: Self-pay | Admitting: Internal Medicine

## 2024-02-03 ENCOUNTER — Ambulatory Visit: Admitting: Internal Medicine

## 2024-02-03 VITALS — BP 130/80 | HR 79 | Temp 97.8°F | Ht 64.0 in | Wt 167.0 lb

## 2024-02-03 DIAGNOSIS — K5792 Diverticulitis of intestine, part unspecified, without perforation or abscess without bleeding: Secondary | ICD-10-CM | POA: Diagnosis not present

## 2024-02-03 DIAGNOSIS — K2100 Gastro-esophageal reflux disease with esophagitis, without bleeding: Secondary | ICD-10-CM | POA: Diagnosis not present

## 2024-02-03 DIAGNOSIS — I10 Essential (primary) hypertension: Secondary | ICD-10-CM

## 2024-02-03 DIAGNOSIS — E538 Deficiency of other specified B group vitamins: Secondary | ICD-10-CM

## 2024-02-03 DIAGNOSIS — R4589 Other symptoms and signs involving emotional state: Secondary | ICD-10-CM

## 2024-02-03 LAB — COMPREHENSIVE METABOLIC PANEL WITH GFR
ALT: 31 U/L (ref 0–35)
AST: 33 U/L (ref 0–37)
Albumin: 4.5 g/dL (ref 3.5–5.2)
Alkaline Phosphatase: 34 U/L — ABNORMAL LOW (ref 39–117)
BUN: 11 mg/dL (ref 6–23)
CO2: 28 meq/L (ref 19–32)
Calcium: 10.3 mg/dL (ref 8.4–10.5)
Chloride: 105 meq/L (ref 96–112)
Creatinine, Ser: 0.73 mg/dL (ref 0.40–1.20)
GFR: 77.9 mL/min (ref >60.00)
Glucose, Bld: 95 mg/dL (ref 70–99)
Potassium: 4.1 meq/L (ref 3.5–5.1)
Sodium: 141 meq/L (ref 135–145)
Total Bilirubin: 1.3 mg/dL — ABNORMAL HIGH (ref 0.2–1.2)
Total Protein: 7.5 g/dL (ref 6.0–8.3)

## 2024-02-03 LAB — CBC WITH DIFFERENTIAL/PLATELET
Basophils Absolute: 0 10*3/uL (ref 0.0–0.1)
Basophils Relative: 0.3 % (ref 0.0–3.0)
Eosinophils Absolute: 0.1 10*3/uL (ref 0.0–0.7)
Eosinophils Relative: 1.1 % (ref 0.0–5.0)
HCT: 45.5 % (ref 36.0–46.0)
Hemoglobin: 15 g/dL (ref 12.0–15.0)
Lymphocytes Relative: 30.6 % (ref 12.0–46.0)
Lymphs Abs: 1.6 10*3/uL (ref 0.7–4.0)
MCHC: 32.9 g/dL (ref 30.0–36.0)
MCV: 90.5 fl (ref 78.0–100.0)
Monocytes Absolute: 0.5 10*3/uL (ref 0.1–1.0)
Monocytes Relative: 8.5 % (ref 3.0–12.0)
Neutro Abs: 3.2 10*3/uL (ref 1.4–7.7)
Neutrophils Relative %: 59.5 % (ref 43.0–77.0)
Platelets: 224 10*3/uL (ref 150.0–400.0)
RBC: 5.02 Mil/uL (ref 3.87–5.11)
RDW: 13.8 % (ref 11.5–15.5)
WBC: 5.4 10*3/uL (ref 4.0–10.5)

## 2024-02-03 LAB — SEDIMENTATION RATE: Sed Rate: 15 mm/h (ref 0–30)

## 2024-02-03 MED ORDER — LEVOFLOXACIN 500 MG PO TABS: 500.0000 mg | ORAL_TABLET | Freq: Every day | ORAL | 0 refills | Status: DC

## 2024-02-03 NOTE — Assessment & Plan Note (Signed)
On Omeprazole prn

## 2024-02-03 NOTE — Assessment & Plan Note (Signed)
 On B12

## 2024-02-03 NOTE — Assessment & Plan Note (Signed)
 Stress discussed Worse due to stress w/her sick son (he had CVA), aphasia x 3 years

## 2024-02-03 NOTE — Assessment & Plan Note (Addendum)
Continue Propranolol 

## 2024-02-03 NOTE — Progress Notes (Signed)
 Subjective:  Patient ID: Laura Vasquez, female    DOB: [birth year]  Age: 78 y.o. MRN: [id number]  CC: Diarrhea (Pt states she is under a lot of stress and believes she is having a flare up Diverticulosis )   HPI Laura Vasquez [facility] presents for LLQ pain, diarrhea x x several days C/o stress w/son F/u HTN    Outpatient Medications Prior to Visit  Medication Sig Dispense Refill   aspirin EC 81 MG tablet Take 81 mg by mouth daily.     B Complex Vitamins (B COMPLEX PO) Take 1 tablet by mouth daily as needed.      calcium carbonate (TUMS - DOSED IN MG ELEMENTAL CALCIUM) 500 MG chewable tablet Chew 1 tablet by mouth as needed.      cholecalciferol (VITAMIN D ) 1000 UNITS tablet Take 2,000 Units by mouth daily.     cyanocobalamin  500 MCG tablet Take 500 mcg by mouth daily as needed (Alternate b/t vitamin b complex).      cyclobenzaprine (FLEXERIL) 10 MG tablet Take 10 mg by mouth every 6 (six) hours as needed.     diphenhydrAMINE  (BENADRYL  ALLERGY CHILDRENS) 12.5 MG chewable tablet Chew 1 tablet (12.5 mg total) by mouth at bedtime as needed (cramps). 30 tablet 0   HYDROcodone -acetaminophen  (NORCO) 5-325 MG tablet Take 1 tablet by mouth every 6 (six) hours as needed for moderate pain. 20 tablet 0   MAGNESIUM PO Take 800 mg by mouth.     meclizine  (ANTIVERT ) 25 MG tablet Take 1 tablet (25 mg total) by mouth 3 (three) times daily as needed for dizziness. 30 tablet 1   Omega-3 Fatty Acids (FISH OIL PO) Take 1-2 each by mouth daily as needed.     omeprazole  (PRILOSEC) 20 MG capsule TAKE 1 CAPSULE BY MOUTH EVERY DAY 90 capsule 3   propranolol  (INDERAL ) 10 MG tablet Take 1 tablet (10 mg total) by mouth 2 (two) times daily. 180 tablet 3   Psyllium (METAMUCIL PO) Take by mouth daily. 1 tablespoon     No facility-administered medications prior to visit.    ROS: Review of Systems  Constitutional:  Positive for fatigue and unexpected weight change. Negative for activity change, appetite change and chills.   HENT:  Negative for congestion, mouth sores and sinus pressure.   Eyes:  Negative for visual disturbance.  Respiratory:  Negative for cough and chest tightness.   Cardiovascular:  Positive for palpitations. Negative for leg swelling.  Gastrointestinal:  Positive for abdominal pain and diarrhea. Negative for nausea and vomiting.  Genitourinary:  Negative for difficulty urinating, frequency and vaginal pain.  Musculoskeletal:  Negative for back pain and gait problem.  Skin:  Negative for pallor and rash.  Neurological:  Negative for dizziness, tremors, weakness, numbness and headaches.  Hematological:  Does not bruise/bleed easily.  Psychiatric/Behavioral:  Positive for dysphoric mood. Negative for confusion, decreased concentration, sleep disturbance and suicidal ideas. The patient is nervous/anxious.     Objective:  BP 130/80   Pulse 79   Temp 97.8 F (36.6 C) (Oral)   Ht 5' 4 (1.626 m)   Wt 167 lb (75.8 kg)   SpO2 95%   BMI 28.67 kg/m   BP Readings from Last 3 Encounters:  02/03/24 130/80  12/07/23 (!) 156/100  08/05/23 130/80    Wt Readings from Last 3 Encounters:  02/03/24 167 lb (75.8 kg)  12/07/23 174 lb (78.9 kg)  08/05/23 175 lb (79.4 kg)    Physical Exam Constitutional:  General: She is not in acute distress.    Appearance: She is well-developed.  HENT:     Head: Normocephalic.     Right Ear: External ear normal.     Left Ear: External ear normal.     Nose: Nose normal.   Eyes:     General:        Right eye: No discharge.        Left eye: No discharge.     Conjunctiva/sclera: Conjunctivae normal.     Pupils: Pupils are equal, round, and reactive to light.   Neck:     Thyroid : No thyromegaly.     Vascular: No JVD.     Trachea: No tracheal deviation.   Cardiovascular:     Rate and Rhythm: Normal rate and regular rhythm.     Heart sounds: Normal heart sounds.  Pulmonary:     Effort: No respiratory distress.     Breath sounds: No stridor. No  wheezing.  Abdominal:     General: Bowel sounds are normal. There is no distension.     Palpations: Abdomen is soft. There is no mass.     Tenderness: There is no abdominal tenderness. There is no guarding or rebound.   Musculoskeletal:        General: No tenderness.     Cervical back: Normal range of motion and neck supple. No rigidity.  Lymphadenopathy:     Cervical: No cervical adenopathy.   Skin:    Findings: No erythema or rash.   Neurological:     Mental Status: She is oriented to person, place, and time.     Cranial Nerves: No cranial nerve deficit.     Motor: No abnormal muscle tone.     Coordination: Coordination normal.     Deep Tendon Reflexes: Reflexes normal.   Psychiatric:        Behavior: Behavior normal.        Thought Content: Thought content normal.        Judgment: Judgment normal.     Lab Results  Component Value Date   WBC 4.2 12/07/2023   HGB 15.1 (H) 12/07/2023   HCT 45.6 12/07/2023   PLT 223.0 12/07/2023   GLUCOSE 102 (H) 12/07/2023   CHOL 263 (H) 05/12/2023   TRIG 91.0 05/12/2023   HDL 99.50 05/12/2023   LDLDIRECT 172.9 04/03/2013   LDLCALC 146 (H) 05/12/2023   ALT 23 12/07/2023   AST 30 12/07/2023   NA 140 12/07/2023   K 4.5 12/07/2023   CL 103 12/07/2023   CREATININE 0.77 12/07/2023   BUN 10 12/07/2023   CO2 27 12/07/2023   TSH 2.34 12/07/2023   HGBA1C 5.8 12/07/2023   MICROALBUR <0.7 05/21/2017    DG Shoulder Left Result Date: 10/30/2022 CLINICAL DATA:  Fall on left side. EXAM: LEFT SHOULDER - 2+ VIEW COMPARISON:  Left shoulder pain. FINDINGS: There is diffuse decreased bone mineralization. There is a transverse comminuted fracture of the surgical neck of the humerus with minimal approximate 4 mm anterior displacement of the distal fracture component with respect to the proximal fracture component. There is an adjacent comminuted fracture of the greater tuberosity with approximately 6 mm lateral displacement of the peripheral greater  tuberosity. Normal glenohumeral and acromioclavicular alignment. Mild moderate calcification within the aortic arch. IMPRESSION: 1. Acute transverse comminuted fracture of the surgical neck of the humerus with minimal anterior displacement of the distal fracture component with respect to the proximal fracture component. 2. Acute comminuted fracture of the  greater tuberosity with approximately 6 mm lateral displacement of the peripheral greater tuberosity. Electronically Signed   By: Bertina Broccoli M.D.   On: 10/30/2022 14:03   CT Head Wo Contrast Result Date: 10/30/2022 CLINICAL DATA:  Marvell Slider and hit head. EXAM: CT HEAD WITHOUT CONTRAST TECHNIQUE: Contiguous axial images were obtained from the base of the skull through the vertex without intravenous contrast. RADIATION DOSE REDUCTION: This exam was performed according to the departmental dose-optimization program which includes automated exposure control, adjustment of the mA and/or kV according to patient size and/or use of iterative reconstruction technique. COMPARISON:  No comparison studies available. FINDINGS: Brain: There is no evidence for acute hemorrhage, hydrocephalus, mass lesion, or abnormal extra-axial fluid collection. No definite CT evidence for acute infarction. Diffuse loss of parenchymal volume is consistent with atrophy. Patchy low attenuation in the deep hemispheric and periventricular white matter is nonspecific, but likely reflects chronic microvascular ischemic demyelination. Vascular: No hyperdense vessel or unexpected calcification. Skull: No evidence for fracture. No worrisome lytic or sclerotic lesion. Sinuses/Orbits: Air-fluid level noted left sphenoid sinus. Remaining visualized paranasal sinuses are clear. No mastoid effusion. Visualized portions of the globes and intraorbital fat are unremarkable. Other: None IMPRESSION: 1. No acute intracranial abnormality. 2. Atrophy with chronic small vessel ischemic disease. 3. Air-fluid level in  the left sphenoid sinus suggests acute sinusitis. Electronically Signed   By: Donnal Fusi M.D.   On: 10/30/2022 13:54    Assessment & Plan:   Problem List Items Addressed This Visit     B12 deficiency   On B12      Essential hypertension   Continue  Propranolol        GERD (gastroesophageal reflux disease)   On Omeprazole  prn      Diverticulitis - Primary   Levaquin  500 mg every day - tolerated it well in the past Pt declined CT Check CBC, ESR, CMET, UA         No orders of the defined types were placed in this encounter.     Follow-up: No follow-ups on file.  Anitra Barn, MD

## 2024-02-03 NOTE — Assessment & Plan Note (Signed)
 Levaquin  500 mg every day - tolerated it well in the past Pt declined CT Check CBC, ESR, CMET, UA

## 2024-02-03 NOTE — Telephone Encounter (Signed)
 Patient was seen today and discussed starting a new medication with Dr. Georgia Kipper. She said she did research and was not interested in the medication suggested.  She would like a call back at [phone].

## 2024-02-04 ENCOUNTER — Ambulatory Visit: Payer: Self-pay | Admitting: Internal Medicine

## 2024-02-04 NOTE — Telephone Encounter (Signed)
 Noted.  I assume it was Levaquin .   Mild cases of diverticulitis may resolve on their own.  Go to ER if feeling worse.  Thanks

## 2024-02-07 NOTE — Telephone Encounter (Signed)
 Copied from CRM [id number]. Topic: Clinical - Medication Question >> Feb 07, 2024  9:24 AM Laura Vasquez wrote: Reason for CRM: Patient is calling in because she is stating that the medication Levaquin  is not working well with her and she is not able to take it as needed. I did advise her of what her PCP stated on Friday but she is wanting a different medication and wants to know what she needs to do because she has gone 3 days without taking medication. Please contact her back to discuss her options.

## 2024-02-08 MED ORDER — DOXYCYCLINE HYCLATE 100 MG PO TABS: 100.0000 mg | ORAL_TABLET | Freq: Two times a day (BID) | ORAL | 0 refills | Status: AC

## 2024-02-08 NOTE — Addendum Note (Signed)
 Addended by: Marq Rebello V on: 02/08/2024 07:17 AM   Modules accepted: Orders

## 2024-02-08 NOTE — Telephone Encounter (Signed)
 We are very limited in what we can use due to Tinesha's multiple antibiotic intolerances. We can try doxycycline  twice a day.  I will send the prescription in.

## 2024-03-08 ENCOUNTER — Encounter: Payer: Self-pay | Admitting: Internal Medicine

## 2024-03-08 ENCOUNTER — Ambulatory Visit: Admitting: Internal Medicine

## 2024-03-08 VITALS — BP 134/80 | HR 57 | Temp 97.8°F | Ht 64.0 in | Wt 167.2 lb

## 2024-03-08 DIAGNOSIS — K5792 Diverticulitis of intestine, part unspecified, without perforation or abscess without bleeding: Secondary | ICD-10-CM

## 2024-03-08 DIAGNOSIS — K5730 Diverticulosis of large intestine without perforation or abscess without bleeding: Secondary | ICD-10-CM

## 2024-03-08 DIAGNOSIS — E538 Deficiency of other specified B group vitamins: Secondary | ICD-10-CM

## 2024-03-08 DIAGNOSIS — R4589 Other symptoms and signs involving emotional state: Secondary | ICD-10-CM

## 2024-03-08 DIAGNOSIS — K2100 Gastro-esophageal reflux disease with esophagitis, without bleeding: Secondary | ICD-10-CM

## 2024-03-08 MED ORDER — PROPRANOLOL HCL 10 MG PO TABS: 10.0000 mg | ORAL_TABLET | Freq: Two times a day (BID) | ORAL | 3 refills | Status: DC

## 2024-03-08 NOTE — Assessment & Plan Note (Signed)
 Stress discussed Ongoing stress w/her sick son (he had CVA), aphasia > 3 years

## 2024-03-08 NOTE — Assessment & Plan Note (Signed)
On Omeprazole prn

## 2024-03-08 NOTE — Assessment & Plan Note (Signed)
 On B12

## 2024-03-08 NOTE — Progress Notes (Signed)
 Subjective:  Patient ID: Laura Vasquez, female    DOB: 04-04-1946  Age: 78 y.o. MRN: [id number]  CC: No chief complaint on file.   HPI Laura Vasquez presents for HTN, stress, diverticulitis - had to use Doxy  Outpatient Medications Prior to Visit  Medication Sig Dispense Refill   aspirin EC 81 MG tablet Take 81 mg by mouth daily.     B Complex Vitamins (B COMPLEX PO) Take 1 tablet by mouth daily as needed.      calcium carbonate (TUMS - DOSED IN MG ELEMENTAL CALCIUM) 500 MG chewable tablet Chew 1 tablet by mouth as needed.      cholecalciferol (VITAMIN D ) 1000 UNITS tablet Take 2,000 Units by mouth daily.     cyanocobalamin  500 MCG tablet Take 500 mcg by mouth daily as needed (Alternate b/t vitamin b complex).      cyclobenzaprine (FLEXERIL) 10 MG tablet Take 10 mg by mouth every 6 (six) hours as needed.     diphenhydrAMINE  (BENADRYL  ALLERGY CHILDRENS) 12.5 MG chewable tablet Chew 1 tablet (12.5 mg total) by mouth at bedtime as needed (cramps). 30 tablet 0   HYDROcodone -acetaminophen  (NORCO) 5-325 MG tablet Take 1 tablet by mouth every 6 (six) hours as needed for moderate pain. 20 tablet 0   MAGNESIUM PO Take 800 mg by mouth.     meclizine  (ANTIVERT ) 25 MG tablet Take 1 tablet (25 mg total) by mouth 3 (three) times daily as needed for dizziness. 30 tablet 1   Omega-3 Fatty Acids (FISH OIL PO) Take 1-2 each by mouth daily as needed.     omeprazole  (PRILOSEC) 20 MG capsule TAKE 1 CAPSULE BY MOUTH EVERY DAY 90 capsule 3   Psyllium (METAMUCIL PO) Take by mouth daily. 1 tablespoon     propranolol  (INDERAL ) 10 MG tablet Take 1 tablet (10 mg total) by mouth 2 (two) times daily. 180 tablet 3   doxycycline  (VIBRA -TABS) 100 MG tablet Take 1 tablet (100 mg total) by mouth 2 (two) times daily. (Patient not taking: Reported on 03/08/2024) 20 tablet 0   No facility-administered medications prior to visit.    ROS: Review of Systems  Constitutional:  Negative for activity change, appetite change,  chills, fatigue and unexpected weight change.  HENT:  Negative for congestion, mouth sores and sinus pressure.   Eyes:  Negative for visual disturbance.  Respiratory:  Negative for cough and chest tightness.   Gastrointestinal:  Negative for abdominal pain and nausea.  Genitourinary:  Negative for difficulty urinating, frequency and vaginal pain.  Musculoskeletal:  Positive for arthralgias. Negative for back pain and gait problem.  Skin:  Negative for pallor and rash.  Neurological:  Negative for dizziness, tremors, weakness, numbness and headaches.  Psychiatric/Behavioral:  Positive for dysphoric mood. Negative for confusion and sleep disturbance. The patient is nervous/anxious.     Objective:  BP 134/80   Pulse (!) 57   Temp 97.8 F (36.6 C) (Temporal)   Ht 5' 4 (1.626 m)   Wt 167 lb 4 oz (75.9 kg)   SpO2 97%   BMI 28.71 kg/m   BP Readings from Last 3 Encounters:  03/08/24 134/80  02/03/24 130/80  12/07/23 (!) 156/100    Wt Readings from Last 3 Encounters:  03/08/24 167 lb 4 oz (75.9 kg)  02/03/24 167 lb (75.8 kg)  12/07/23 174 lb (78.9 kg)    Physical Exam Constitutional:      General: She is not in acute distress.    Appearance:  Normal appearance. She is well-developed.  HENT:     Head: Normocephalic.     Right Ear: External ear normal.     Left Ear: External ear normal.     Nose: Nose normal.  Eyes:     General:        Right eye: No discharge.        Left eye: No discharge.     Conjunctiva/sclera: Conjunctivae normal.     Pupils: Pupils are equal, round, and reactive to light.  Neck:     Thyroid : No thyromegaly.     Vascular: No JVD.     Trachea: No tracheal deviation.  Cardiovascular:     Rate and Rhythm: Normal rate and regular rhythm.     Heart sounds: Normal heart sounds.  Pulmonary:     Effort: No respiratory distress.     Breath sounds: No stridor. No wheezing.  Abdominal:     General: Bowel sounds are normal. There is no distension.      Palpations: Abdomen is soft. There is no mass.     Tenderness: There is no abdominal tenderness. There is no guarding or rebound.  Musculoskeletal:        General: No tenderness.     Cervical back: Normal range of motion and neck supple. No rigidity.  Lymphadenopathy:     Cervical: No cervical adenopathy.  Skin:    Findings: No erythema or rash.  Neurological:     Cranial Nerves: No cranial nerve deficit.     Motor: No abnormal muscle tone.     Coordination: Coordination normal.     Deep Tendon Reflexes: Reflexes normal.  Psychiatric:        Behavior: Behavior normal.        Thought Content: Thought content normal.        Judgment: Judgment normal.     Lab Results  Component Value Date   WBC 5.4 02/03/2024   HGB 15.0 02/03/2024   HCT 45.5 02/03/2024   PLT 224.0 02/03/2024   GLUCOSE 95 02/03/2024   CHOL 263 (H) 05/12/2023   TRIG 91.0 05/12/2023   HDL 99.50 05/12/2023   LDLDIRECT 172.9 04/03/2013   LDLCALC 146 (H) 05/12/2023   ALT 31 02/03/2024   AST 33 02/03/2024   NA 141 02/03/2024   K 4.1 02/03/2024   CL 105 02/03/2024   CREATININE 0.73 02/03/2024   BUN 11 02/03/2024   CO2 28 02/03/2024   TSH 2.34 12/07/2023   HGBA1C 5.8 12/07/2023    DG Shoulder Left Result Date: 10/30/2022 CLINICAL DATA:  Fall on left side. EXAM: LEFT SHOULDER - 2+ VIEW COMPARISON:  Left shoulder pain. FINDINGS: There is diffuse decreased bone mineralization. There is a transverse comminuted fracture of the surgical neck of the humerus with minimal approximate 4 mm anterior displacement of the distal fracture component with respect to the proximal fracture component. There is an adjacent comminuted fracture of the greater tuberosity with approximately 6 mm lateral displacement of the peripheral greater tuberosity. Normal glenohumeral and acromioclavicular alignment. Mild moderate calcification within the aortic arch. IMPRESSION: 1. Acute transverse comminuted fracture of the surgical neck of the  humerus with minimal anterior displacement of the distal fracture component with respect to the proximal fracture component. 2. Acute comminuted fracture of the greater tuberosity with approximately 6 mm lateral displacement of the peripheral greater tuberosity. Electronically Signed   By: Tanda Lyons M.D.   On: 10/30/2022 14:03   CT Head Wo Contrast Result Date: 10/30/2022 CLINICAL  DATA:  Clemens and hit head. EXAM: CT HEAD WITHOUT CONTRAST TECHNIQUE: Contiguous axial images were obtained from the base of the skull through the vertex without intravenous contrast. RADIATION DOSE REDUCTION: This exam was performed according to the departmental dose-optimization program which includes automated exposure control, adjustment of the mA and/or kV according to patient size and/or use of iterative reconstruction technique. COMPARISON:  No comparison studies available. FINDINGS: Brain: There is no evidence for acute hemorrhage, hydrocephalus, mass lesion, or abnormal extra-axial fluid collection. No definite CT evidence for acute infarction. Diffuse loss of parenchymal volume is consistent with atrophy. Patchy low attenuation in the deep hemispheric and periventricular white matter is nonspecific, but likely reflects chronic microvascular ischemic demyelination. Vascular: No hyperdense vessel or unexpected calcification. Skull: No evidence for fracture. No worrisome lytic or sclerotic lesion. Sinuses/Orbits: Air-fluid level noted left sphenoid sinus. Remaining visualized paranasal sinuses are clear. No mastoid effusion. Visualized portions of the globes and intraorbital fat are unremarkable. Other: None IMPRESSION: 1. No acute intracranial abnormality. 2. Atrophy with chronic small vessel ischemic disease. 3. Air-fluid level in the left sphenoid sinus suggests acute sinusitis. Electronically Signed   By: Camellia Candle M.D.   On: 10/30/2022 13:54    Assessment & Plan:   Problem List Items Addressed This Visit     B12  deficiency   On B12      Depressed mood   Stress discussed Ongoing stress w/her sick son (he had CVA), aphasia > 3 years      Diverticulitis   Pt had to use Doxy (Levaquin  caused side effects - anxiety)      Diverticulosis of large intestine - Primary   Pt had to use Doxy (Levaquin  caused side effects - anxiety)      GERD (gastroesophageal reflux disease)   On Omeprazole  prn         Meds ordered this encounter  Medications   propranolol  (INDERAL ) 10 MG tablet    Sig: Take 1 tablet (10 mg total) by mouth 2 (two) times daily.    Dispense:  180 tablet    Refill:  3      Follow-up: Return in about 3 months (around 06/08/2024) for a follow-up visit.  Marolyn Noel, MD

## 2024-03-08 NOTE — Assessment & Plan Note (Signed)
 Pt had to use Doxy (Levaquin  caused side effects - anxiety)

## 2024-04-18 ENCOUNTER — Telehealth: Payer: Self-pay

## 2024-04-18 NOTE — Telephone Encounter (Signed)
 Copied from CRM #[id number]. Topic: General - Other >> Apr 18, 2024  3:39 PM Mesmerise C wrote: Reason for CRM: Patient stated her cardiologist had retired and Dr. Garald gave her a recomendation for one during her appointment but she forgot his name and inquiring who was the cardiologist he suggested would like a call back

## 2024-04-20 NOTE — Telephone Encounter (Signed)
 Spoke with [place], gave her this recommendation from Dr.P

## 2024-04-20 NOTE — Telephone Encounter (Signed)
 Dr Pietro Thx

## 2024-06-12 ENCOUNTER — Encounter: Payer: Self-pay | Admitting: Internal Medicine

## 2024-06-12 ENCOUNTER — Ambulatory Visit: Admitting: Internal Medicine

## 2024-06-12 VITALS — BP 146/82 | HR 81 | Temp 98.0°F | Ht 64.0 in | Wt 171.8 lb

## 2024-06-12 DIAGNOSIS — E538 Deficiency of other specified B group vitamins: Secondary | ICD-10-CM | POA: Diagnosis not present

## 2024-06-12 DIAGNOSIS — Z23 Encounter for immunization: Secondary | ICD-10-CM | POA: Diagnosis not present

## 2024-06-12 DIAGNOSIS — R739 Hyperglycemia, unspecified: Secondary | ICD-10-CM | POA: Diagnosis not present

## 2024-06-12 DIAGNOSIS — F419 Anxiety disorder, unspecified: Secondary | ICD-10-CM | POA: Diagnosis not present

## 2024-06-12 DIAGNOSIS — M1991 Primary osteoarthritis, unspecified site: Secondary | ICD-10-CM

## 2024-06-12 DIAGNOSIS — I10 Essential (primary) hypertension: Secondary | ICD-10-CM

## 2024-06-12 DIAGNOSIS — F439 Reaction to severe stress, unspecified: Secondary | ICD-10-CM

## 2024-06-12 LAB — COMPREHENSIVE METABOLIC PANEL WITH GFR
ALT: 22 U/L (ref 0–35)
AST: 28 U/L (ref 0–37)
Albumin: 4.6 g/dL (ref 3.5–5.2)
Alkaline Phosphatase: 38 U/L — ABNORMAL LOW (ref 39–117)
BUN: 11 mg/dL (ref 6–23)
CO2: 29 meq/L (ref 19–32)
Calcium: 9.7 mg/dL (ref 8.4–10.5)
Chloride: 102 meq/L (ref 96–112)
Creatinine, Ser: 0.65 mg/dL (ref 0.40–1.20)
GFR: 83.19 mL/min (ref >60.00)
Glucose, Bld: 98 mg/dL (ref 70–99)
Potassium: 4.6 meq/L (ref 3.5–5.1)
Sodium: 140 meq/L (ref 135–145)
Total Bilirubin: 1.1 mg/dL (ref 0.2–1.2)
Total Protein: 7.5 g/dL (ref 6.0–8.3)

## 2024-06-12 LAB — TSH: TSH: 2.85 u[IU]/mL (ref 0.35–5.50)

## 2024-06-12 NOTE — Assessment & Plan Note (Signed)
Ongoing.  Discussed. °

## 2024-06-12 NOTE — Assessment & Plan Note (Addendum)
 Mild Check A1c prn

## 2024-06-12 NOTE — Addendum Note (Signed)
 Addended byBETHA LUCETTA CLEATRICE LELON on: 06/12/2024 09:58 AM   Modules accepted: Orders

## 2024-06-12 NOTE — Assessment & Plan Note (Signed)
 On B12

## 2024-06-12 NOTE — Assessment & Plan Note (Signed)
 On glucosamine now

## 2024-06-12 NOTE — Assessment & Plan Note (Addendum)
 Taking care of her son who had a CVA in 7/22 - still stressed

## 2024-06-12 NOTE — Progress Notes (Signed)
 Subjective:  Patient ID: Laura Vasquez, female    DOB: 05-19-1946  Age: 78 y.o. MRN: [id number]  CC: Medical Management of Chronic Issues (3 Month follow up. Bilateral intermittent ear pain)   HPI Laura Vasquez [facility] presents for HTN, B12 def, stress  Outpatient Medications Prior to Visit  Medication Sig Dispense Refill   aspirin EC 81 MG tablet Take 81 mg by mouth daily.     B Complex Vitamins (B COMPLEX PO) Take 1 tablet by mouth daily as needed.      calcium carbonate (TUMS - DOSED IN MG ELEMENTAL CALCIUM) 500 MG chewable tablet Chew 1 tablet by mouth as needed.      cholecalciferol (VITAMIN D ) 1000 UNITS tablet Take 2,000 Units by mouth daily.     cyanocobalamin  500 MCG tablet Take 500 mcg by mouth daily as needed (Alternate b/t vitamin b complex).      cyclobenzaprine (FLEXERIL) 10 MG tablet Take 10 mg by mouth every 6 (six) hours as needed.     diphenhydrAMINE  (BENADRYL  ALLERGY CHILDRENS) 12.5 MG chewable tablet Chew 1 tablet (12.5 mg total) by mouth at bedtime as needed (cramps). 30 tablet 0   HYDROcodone -acetaminophen  (NORCO) 5-325 MG tablet Take 1 tablet by mouth every 6 (six) hours as needed for moderate pain. 20 tablet 0   MAGNESIUM PO Take 800 mg by mouth.     meclizine  (ANTIVERT ) 25 MG tablet Take 1 tablet (25 mg total) by mouth 3 (three) times daily as needed for dizziness. 30 tablet 1   Omega-3 Fatty Acids (FISH OIL PO) Take 1-2 each by mouth daily as needed.     omeprazole  (PRILOSEC) 20 MG capsule TAKE 1 CAPSULE BY MOUTH EVERY DAY 90 capsule 3   propranolol  (INDERAL ) 10 MG tablet Take 1 tablet (10 mg total) by mouth 2 (two) times daily. 180 tablet 3   Psyllium (METAMUCIL PO) Take by mouth daily. 1 tablespoon     doxycycline  (VIBRA -TABS) 100 MG tablet Take 1 tablet (100 mg total) by mouth 2 (two) times daily. (Patient not taking: Reported on 06/12/2024) 20 tablet 0   No facility-administered medications prior to visit.    ROS: Review of Systems  Constitutional:  Negative  for activity change, appetite change, chills, fatigue and unexpected weight change.  HENT:  Negative for congestion, mouth sores and sinus pressure.   Eyes:  Negative for visual disturbance.  Respiratory:  Negative for cough and chest tightness.   Gastrointestinal:  Negative for abdominal pain and nausea.  Genitourinary:  Negative for difficulty urinating, frequency and vaginal pain.  Musculoskeletal:  Negative for back pain and gait problem.  Skin:  Negative for pallor and rash.  Neurological:  Negative for dizziness, tremors, weakness, numbness and headaches.  Psychiatric/Behavioral:  Positive for dysphoric mood. Negative for confusion, sleep disturbance and suicidal ideas. The patient is nervous/anxious.     Objective:  BP (!) 146/82   Pulse 81   Temp 98 F (36.7 C)   Ht 5' 4 (1.626 m)   Wt 171 lb 12.8 oz (77.9 kg)   SpO2 99%   BMI 29.49 kg/m   BP Readings from Last 3 Encounters:  06/12/24 (!) 146/82  03/08/24 134/80  02/03/24 130/80    Wt Readings from Last 3 Encounters:  06/12/24 171 lb 12.8 oz (77.9 kg)  03/08/24 167 lb 4 oz (75.9 kg)  02/03/24 167 lb (75.8 kg)    Physical Exam Constitutional:      General: She is not in acute distress.  Appearance: She is well-developed.  HENT:     Head: Normocephalic.     Right Ear: External ear normal.     Left Ear: External ear normal.     Nose: Nose normal.  Eyes:     General:        Right eye: No discharge.        Left eye: No discharge.     Conjunctiva/sclera: Conjunctivae normal.     Pupils: Pupils are equal, round, and reactive to light.  Neck:     Thyroid : No thyromegaly.     Vascular: No JVD.     Trachea: No tracheal deviation.  Cardiovascular:     Rate and Rhythm: Normal rate and regular rhythm.     Heart sounds: Normal heart sounds.  Pulmonary:     Effort: No respiratory distress.     Breath sounds: No stridor. No wheezing.  Abdominal:     General: Bowel sounds are normal. There is no distension.      Palpations: Abdomen is soft. There is no mass.     Tenderness: There is no abdominal tenderness. There is no guarding or rebound.  Musculoskeletal:        General: No tenderness.     Cervical back: Normal range of motion and neck supple. No rigidity.  Lymphadenopathy:     Cervical: No cervical adenopathy.  Skin:    Findings: No erythema or rash.  Neurological:     Cranial Nerves: No cranial nerve deficit.     Motor: No abnormal muscle tone.     Coordination: Coordination normal.     Deep Tendon Reflexes: Reflexes normal.  Psychiatric:        Behavior: Behavior normal.        Thought Content: Thought content normal.        Judgment: Judgment normal.     Lab Results  Component Value Date   WBC 5.4 02/03/2024   HGB 15.0 02/03/2024   HCT 45.5 02/03/2024   PLT 224.0 02/03/2024   GLUCOSE 95 02/03/2024   CHOL 263 (H) 05/12/2023   TRIG 91.0 05/12/2023   HDL 99.50 05/12/2023   LDLDIRECT 172.9 04/03/2013   LDLCALC 146 (H) 05/12/2023   ALT 31 02/03/2024   AST 33 02/03/2024   NA 141 02/03/2024   K 4.1 02/03/2024   CL 105 02/03/2024   CREATININE 0.73 02/03/2024   BUN 11 02/03/2024   CO2 28 02/03/2024   TSH 2.34 12/07/2023   HGBA1C 5.8 12/07/2023    DG Shoulder Left Result Date: 10/30/2022 CLINICAL DATA:  Fall on left side. EXAM: LEFT SHOULDER - 2+ VIEW COMPARISON:  Left shoulder pain. FINDINGS: There is diffuse decreased bone mineralization. There is a transverse comminuted fracture of the surgical neck of the humerus with minimal approximate 4 mm anterior displacement of the distal fracture component with respect to the proximal fracture component. There is an adjacent comminuted fracture of the greater tuberosity with approximately 6 mm lateral displacement of the peripheral greater tuberosity. Normal glenohumeral and acromioclavicular alignment. Mild moderate calcification within the aortic arch. IMPRESSION: 1. Acute transverse comminuted fracture of the surgical neck of the  humerus with minimal anterior displacement of the distal fracture component with respect to the proximal fracture component. 2. Acute comminuted fracture of the greater tuberosity with approximately 6 mm lateral displacement of the peripheral greater tuberosity. Electronically Signed   By: Tanda Lyons M.D.   On: 10/30/2022 14:03   CT Head Wo Contrast Result Date: 10/30/2022 CLINICAL DATA:  Fell and hit head. EXAM: CT HEAD WITHOUT CONTRAST TECHNIQUE: Contiguous axial images were obtained from the base of the skull through the vertex without intravenous contrast. RADIATION DOSE REDUCTION: This exam was performed according to the departmental dose-optimization program which includes automated exposure control, adjustment of the mA and/or kV according to patient size and/or use of iterative reconstruction technique. COMPARISON:  No comparison studies available. FINDINGS: Brain: There is no evidence for acute hemorrhage, hydrocephalus, mass lesion, or abnormal extra-axial fluid collection. No definite CT evidence for acute infarction. Diffuse loss of parenchymal volume is consistent with atrophy. Patchy low attenuation in the deep hemispheric and periventricular white matter is nonspecific, but likely reflects chronic microvascular ischemic demyelination. Vascular: No hyperdense vessel or unexpected calcification. Skull: No evidence for fracture. No worrisome lytic or sclerotic lesion. Sinuses/Orbits: Air-fluid level noted left sphenoid sinus. Remaining visualized paranasal sinuses are clear. No mastoid effusion. Visualized portions of the globes and intraorbital fat are unremarkable. Other: None IMPRESSION: 1. No acute intracranial abnormality. 2. Atrophy with chronic small vessel ischemic disease. 3. Air-fluid level in the left sphenoid sinus suggests acute sinusitis. Electronically Signed   By: Camellia Candle M.D.   On: 10/30/2022 13:54    Assessment & Plan:   Problem List Items Addressed This Visit      Anxiety   Taking care of her son who had a CVA in 7/22 - still stressed      B12 deficiency   On B12      Essential hypertension   Continue  Propranolol        Relevant Orders   Comprehensive metabolic panel with GFR   TSH   Hyperglycemia - Primary   Mild Check A1c prn      Relevant Orders   TSH   Osteoarthritis   On glucosamine now      Stress at home   Ongoing Discussed         No orders of the defined types were placed in this encounter.      Follow-up: Return in about 3 months (around 09/12/2024) for a follow-up visit.  Marolyn Noel, MD

## 2024-06-12 NOTE — Assessment & Plan Note (Signed)
Continue Propranolol 

## 2024-06-14 ENCOUNTER — Ambulatory Visit: Payer: Self-pay | Admitting: Internal Medicine

## 2024-08-11 ENCOUNTER — Other Ambulatory Visit: Payer: Self-pay | Admitting: Internal Medicine

## 2024-10-16 ENCOUNTER — Ambulatory Visit: Admitting: Internal Medicine
